# Patient Record
Sex: Female | Born: 1950 | Race: Black or African American | Hispanic: No | Marital: Single | Smoking: Never smoker
Health system: Southern US, Community
[De-identification: ages and names within clinical notes are randomized; demographics above are authoritative.]

## PROBLEM LIST (undated history)

## (undated) DIAGNOSIS — R350 Frequency of micturition: Secondary | ICD-10-CM

## (undated) DIAGNOSIS — R569 Unspecified convulsions: Secondary | ICD-10-CM

## (undated) DIAGNOSIS — I10 Essential (primary) hypertension: Secondary | ICD-10-CM

## (undated) DIAGNOSIS — N819 Female genital prolapse, unspecified: Secondary | ICD-10-CM

## (undated) DIAGNOSIS — R053 Chronic cough: Secondary | ICD-10-CM

## (undated) DIAGNOSIS — K759 Inflammatory liver disease, unspecified: Secondary | ICD-10-CM

## (undated) DIAGNOSIS — R351 Nocturia: Secondary | ICD-10-CM

## (undated) DIAGNOSIS — R05 Cough: Secondary | ICD-10-CM

## (undated) DIAGNOSIS — R3915 Urgency of urination: Secondary | ICD-10-CM

## (undated) DIAGNOSIS — K5900 Constipation, unspecified: Secondary | ICD-10-CM

## (undated) DIAGNOSIS — E119 Type 2 diabetes mellitus without complications: Secondary | ICD-10-CM

## (undated) DIAGNOSIS — F1111 Opioid abuse, in remission: Secondary | ICD-10-CM

## (undated) DIAGNOSIS — K219 Gastro-esophageal reflux disease without esophagitis: Secondary | ICD-10-CM

## (undated) DIAGNOSIS — E785 Hyperlipidemia, unspecified: Secondary | ICD-10-CM

## (undated) DIAGNOSIS — J45909 Unspecified asthma, uncomplicated: Secondary | ICD-10-CM

## (undated) DIAGNOSIS — Z8619 Personal history of other infectious and parasitic diseases: Secondary | ICD-10-CM

## (undated) DIAGNOSIS — M1990 Unspecified osteoarthritis, unspecified site: Secondary | ICD-10-CM

## (undated) DIAGNOSIS — Z87442 Personal history of urinary calculi: Secondary | ICD-10-CM

## (undated) HISTORY — PX: TUBAL LIGATION: SHX77

## (undated) HISTORY — PX: BREAST REDUCTION SURGERY: SHX8

## (undated) HISTORY — PX: ESOPHAGOGASTRODUODENOSCOPY: SHX1529

## (undated) HISTORY — PX: COLONOSCOPY W/ POLYPECTOMY: SHX1380

## (undated) HISTORY — DX: Unspecified asthma, uncomplicated: J45.909

## (undated) HISTORY — PX: BREAST SURGERY: SHX581

---

## 1998-03-02 ENCOUNTER — Ambulatory Visit (HOSPITAL_COMMUNITY): Admission: RE | Admit: 1998-03-02 | Discharge: 1998-03-02 | Payer: Self-pay | Admitting: Internal Medicine

## 2012-05-17 ENCOUNTER — Other Ambulatory Visit: Payer: Self-pay | Admitting: Family Medicine

## 2012-05-17 DIAGNOSIS — Z1231 Encounter for screening mammogram for malignant neoplasm of breast: Secondary | ICD-10-CM

## 2012-06-06 ENCOUNTER — Ambulatory Visit: Payer: Self-pay

## 2012-06-18 ENCOUNTER — Ambulatory Visit
Admission: RE | Admit: 2012-06-18 | Discharge: 2012-06-18 | Disposition: A | Discharge status: Home / Self Care | Payer: Medicaid Other | Source: Ambulatory Visit | Attending: Family Medicine | Admitting: Family Medicine

## 2012-06-18 DIAGNOSIS — Z1231 Encounter for screening mammogram for malignant neoplasm of breast: Secondary | ICD-10-CM

## 2012-10-16 ENCOUNTER — Encounter: Payer: Self-pay | Admitting: Internal Medicine

## 2012-10-16 ENCOUNTER — Ambulatory Visit (INDEPENDENT_AMBULATORY_CARE_PROVIDER_SITE_OTHER): Payer: Medicaid Other | Admitting: Internal Medicine

## 2012-10-16 VITALS — BP 130/82 | HR 69 | Temp 97.9°F | Ht 62.0 in | Wt 193.0 lb

## 2012-10-16 DIAGNOSIS — R05 Cough: Secondary | ICD-10-CM

## 2012-10-16 MED ORDER — DEXLANSOPRAZOLE 60 MG PO CPDR: 60.0000 mg | DELAYED_RELEASE_CAPSULE | Freq: Every day | ORAL | Status: DC

## 2012-10-16 MED ORDER — FAMOTIDINE 20 MG PO TABS: ORAL_TABLET | ORAL | Status: DC

## 2012-10-16 MED ORDER — PREDNISONE (PAK) 10 MG PO TABS: ORAL_TABLET | ORAL | Status: DC

## 2012-10-16 NOTE — Patient Instructions (Addendum)
Stop qvar and just yellow inhaler proventil if needed  Prednisone 10 mg take  4 each am x 2 days,   2 each am x 2 days,  1 each am x2days and stop   Try dexilant 60 mg   Take one 30-60 min before first meal of the day and Pepcid 20 mg one bedtime until return  GERD (REFLUX)  is an extremely common cause of respiratory symptoms, many times with no significant heartburn at all.    It can be treated with medication, but also with lifestyle changes including avoidance of late meals, excessive alcohol, smoking cessation, and avoid fatty foods, chocolate, peppermint, colas, red wine, and acidic juices such as orange juice.  NO MINT OR MENTHOL PRODUCTS SO NO COUGH DROPS  USE SUGARLESS CANDY INSTEAD (jolley ranchers or Stover's)  NO OIL BASED VITAMINS - use powdered substitutes.    Please schedule a follow up office visit in 2 weeks, sooner if needed

## 2012-10-16 NOTE — Assessment & Plan Note (Signed)
The most common causes of chronic cough in immunocompetent adults include the following: upper airway cough syndrome (UACS), previously referred to as postnasal drip syndrome (PNDS), which is caused by variety of rhinosinus conditions; (2) asthma; (3) GERD; (4) chronic bronchitis from cigarette smoking or other inhaled environmental irritants; (5) nonasthmatic eosinophilic bronchitis; and (6) bronchiectasis.   These conditions, singly or in combination, have accounted for up to 94% of the causes of chronic cough in prospective studies.   Other conditions have constituted no >6% of the causes in prospective studies These have included bronchogenic carcinoma, chronic interstitial pneumonia, sarcoidosis, left ventricular failure, ACEI-induced cough, and aspiration from a condition associated with pharyngeal dysfunction.   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   These are the same pts (now being labeled as having "irritable larynx syndrome" by some cough centers) who not infrequently have a history of having failed to tolerate ace inhibitors,  dry powder inhalers or biphosphonates or report having atypical reflux symptoms that don't respond to standard doses of PPI , and are easily confused as having aecopd or asthma flares by even experienced allergists/ pulmonologists.   Will try on max gerd rx then regroup in 2 weeks  Reviewed with pt: The standardized cough guidelines recently published in Chest by [place] in 2006  are a multiple step process  (up to 12!) , not a single office visit,  and are intended  to address this problem logically,  with an alogrithm dependent on response to empiric treatment at  each progressive step  to determine a specific diagnosis with  minimal addtional testing needed. Therefore if compliance is an issue or can't be accurately verified then it's very unlikely the standard evaluation and treatment will be successful here.    Furthermore, response to therapy (other than acute cough suppression, which should only be used short term with avoidance of narcotic containing cough syrups if possible), can be a gradual process for which the patient may not receive immediate benefit.  Unlike going to an eye doctor where the right rx is almost always the first one and is immediately effective, this is almost never the case in the management of chronic cough syndromes and the patient needs to commit up front to compliance with recommendations and have the patience to wait out a response for up to 6 weeks of therapy directed at the likely underlying problem(s).

## 2012-10-16 NOTE — Progress Notes (Addendum)
Subjective:    Patient ID: Gloria Patterson, female    DOB: November 27, 1951   MRN: [id number]  HPI  65 yobf never smoker  dx with asthma 1980's= wheeze and sob and required daily inhalers until around 2000 when stopped taking IV drugs and onset of cough after stopped drugs daily since and worse since 2011 so referred [facility] 10/16/2012 to pulmonary clinic for cough.   10/16/2012 1st pulmonary eval cc daily cough x 13 years worse x 2 years daily x year round prod min mucus white better on cough suppression, inhalers with proventil and qvar > no better.  Night when lie down worse and has to sit up eventually goes away and sleeps ok without am exac.  Did best with prednisone x 2 weeks after   Using cough drops.     Kouffman Reflux v Neurogenic Cough Differentiator Reflux Comments  Do you awaken from a sound sleep coughing violently?                            With trouble breathing? no   Do you have choking episodes when you cannot  Get enough air, gasping for air ?              Yes   Do you usually cough when you lie down into  The bed, or when you just lie down to rest ?                          Yes   Do you usually cough after meals or eating?         Yes   Do you cough when (or after) you bend over?    no   GERD SCORE     Kouffman Reflux v Neurogenic Cough Differentiator Neurogenic   Do you more-or-less cough all day long? no   Does change of temperature make you cough? yes   Does laughing or chuckling cause you to cough? yes   Do fumes (perfume, automobile fumes, burned  Toast, etc.,) cause you to cough ?      yes   Does speaking, singing, or talking on the phone cause you to cough   ?               yes   Neurogenic/Airway score           Review of Systems  Constitutional: Negative for fever, chills and unexpected weight change.  HENT: Positive for trouble swallowing. Negative for ear pain, nosebleeds, congestion, sore throat, rhinorrhea, sneezing, dental problem,  voice change, postnasal drip and sinus pressure.   Eyes: Negative for visual disturbance.  Respiratory: Positive for cough and shortness of breath. Negative for choking.   Cardiovascular: Positive for chest pain and leg swelling.  Gastrointestinal: Negative for vomiting, abdominal pain and diarrhea.  Genitourinary: Negative for difficulty urinating.  Musculoskeletal: Negative for arthralgias.  Skin: Negative for rash.  Neurological: Negative for tremors, syncope and headaches.  Hematological: Does not bruise/bleed easily.       Objective:   Physical Exam  edentuous amb bf nad with classic wheezing  Wt 193 10/16/2012    HEENT: nl dentition, turbinates, and orophanx. Nl external ear canals without cough reflex   NECK :  without JVD/Nodes/TM/ nl carotid upstrokes bilaterally   LUNGS: no acc muscle use, clear to A and P bilaterally without cough on insp or exp maneuvers  CV:  RRR  no s3 or murmur or increase in P2, no edema   ABD:  soft and nontender with nl excursion in the supine position. No bruits or organomegaly, bowel sounds nl  MS:  warm without deformities, calf tenderness, cyanosis or clubbing  SKIN: warm and dry without lesions    NEURO:  alert, approp, no deficits    cxr 09/30/12 report reviewed No active dz    Assessment & Plan:

## 2012-10-30 ENCOUNTER — Encounter: Payer: Self-pay | Admitting: Internal Medicine

## 2012-10-30 ENCOUNTER — Ambulatory Visit (INDEPENDENT_AMBULATORY_CARE_PROVIDER_SITE_OTHER): Payer: Medicaid Other | Admitting: Internal Medicine

## 2012-10-30 VITALS — BP 140/90 | HR 82 | Temp 97.8°F | Ht 62.0 in | Wt 192.0 lb

## 2012-10-30 DIAGNOSIS — R05 Cough: Secondary | ICD-10-CM

## 2012-10-30 MED ORDER — PREDNISONE (PAK) 10 MG PO TABS: ORAL_TABLET | ORAL | Status: DC

## 2012-10-30 MED ORDER — FAMOTIDINE 20 MG PO TABS: ORAL_TABLET | ORAL | Status: DC

## 2012-10-30 MED ORDER — OMEPRAZOLE 20 MG PO CPDR: 20.0000 mg | DELAYED_RELEASE_CAPSULE | Freq: Every day | ORAL | Status: DC

## 2012-10-30 NOTE — Progress Notes (Signed)
Subjective:    Patient ID: Gloria Patterson, female    DOB: 1951/09/26   MRN: [id number]  HPI  24 yobf never smoker  dx with asthma 1980's= wheeze and sob and required daily inhalers until around 2000 when stopped taking IV drugs and onset of cough after stopped drugs daily since and worse since 2011 so referred [facility] 10/16/2012 to pulmonary clinic for cough.   10/16/2012 1st pulmonary eval cc daily cough x 13 years worse x 2 years daily x year round prod min mucus white better on cough suppression, inhalers with proventil and qvar > no better.  Night when lie down worse and has to sit up eventually goes away and sleeps ok without am exac.  Did best with prednisone x 2 weeks after  Using cough drops and having food sticks in throat rec Stop qvar and just yellow inhaler proventil if needed > did not need any Prednisone 10 mg take  4 each am x 2 days,   2 each am x 2 days,  1 each am x2days and stop  Try dexilant 60 mg   Take one 30-60 min before first meal of the day and Pepcid 20 mg one bedtime until return GERD diet  10/30/2012 f/u ov/Jenaro Souder cc completely better p prednisone then cough recurred about half as severe the day after prednisone completed, but no use of inhalers at all since last ov and no excess mucus produciton. No obvious daytime variabilty or assoc sob or cp or chest tightness, subjective wheeze overt sinus or hb symptoms. No unusual exp hx or h/o childhood pna/ asthma or premature birth to his knowledge.     Kouffman Reflux v Neurogenic Cough Differentiator Reflux Comments  Do you awaken from a sound sleep coughing violently?                            With trouble breathing? no   Do you have choking episodes when you cannot  Get enough air, gasping for air ?              no   Do you usually cough when you lie down into  The bed, or when you just lie down to rest ?                          no   Do you usually cough after meals or eating?         maybe   Do you  cough when (or after) you bend over?    no   GERD SCORE     Kouffman Reflux v Neurogenic Cough Differentiator Neurogenic   Do you more-or-less cough all day long? no   Does change of temperature make you cough? yes   Does laughing or chuckling cause you to cough? yes   Do fumes (perfume, automobile fumes, burned  Toast, etc.,) cause you to cough ?      yes   Does speaking, singing, or talking on the phone cause you to cough   ?               better   Neurogenic/Airway score          Objective:   Physical Exam  edentuous amb bf nad with min pseudowheeze on insp only (much better)  Wt 193 10/16/2012  > 192 10/30/2012    HEENT: nl dentition, turbinates, and orophanx. Nl  external ear canals without cough reflex   NECK :  without JVD/Nodes/TM/ nl carotid upstrokes bilaterally   LUNGS: no acc muscle use, clear to A and P bilaterally without cough on insp or exp maneuvers   CV:  RRR  no s3 or murmur or increase in P2, no edema   ABD:  soft and nontender with nl excursion in the supine position. No bruits or organomegaly, bowel sounds nl  MS:  warm without deformities, calf tenderness, cyanosis or clubbing      cxr 09/30/12 report reviewed No active dz    Assessment & Plan:

## 2012-10-30 NOTE — Patient Instructions (Addendum)
Omeprazole  20mg   Take 30-60 min before first meal of the day and Pepcid 20 mg one bedtime until  Return  Prednisone 10 mg take  4 each am x 2 days,   2 each am x 2 days,  1 each am x2days and stop   GERD (REFLUX)  is an extremely common cause of respiratory symptoms, many times with no significant heartburn at all.    It can be treated with medication, but also with lifestyle changes including avoidance of late meals, excessive alcohol, smoking cessation, and avoid fatty foods, chocolate, peppermint, colas, red wine, and acidic juices such as orange juice.  NO MINT OR MENTHOL PRODUCTS SO NO COUGH DROPS  USE SUGARLESS CANDY INSTEAD (jolley ranchers or Stover's)  NO OIL BASED VITAMINS - use powdered substitutes.    Please schedule a follow up office visit in 4 weeks, sooner if needed

## 2012-11-01 NOTE — Assessment & Plan Note (Addendum)
Lack of flare off qvar and b2 argue against asthma and the recurrence of symptoms immediately after stopping prednisone argues for a psychogenic component to the cough but she is clearly better at this point on rx  For  Classic Upper airway cough syndrome, so named because it's frequently impossible to sort out how much is  CR/sinusitis with freq throat clearing (which can be related to primary GERD)   vs  causing  secondary (" extra esophageal")  GERD from wide swings in gastric pressure that occur with throat clearing, often  promoting self use of mint and menthol lozenges that reduce the lower esophageal sphincter tone and exacerbate the problem further in a cyclical fashion.   These are the same pts (now being labeled as having "irritable larynx syndrome" by some cough centers) who not infrequently have a history of having failed to tolerate ace inhibitors,  dry powder inhalers or biphosphonates or report having atypical reflux symptoms that don't respond to standard doses of PPI , and are easily confused as having aecopd or asthma flares by even experienced allergists/ pulmonologists.   For now therefore continue gerd rx and given second course of prednisone to see if get same strong response, in which case needs methacholine challenge to r/o asthma

## 2012-11-28 ENCOUNTER — Ambulatory Visit: Payer: Medicaid Other | Admitting: Internal Medicine

## 2012-12-05 ENCOUNTER — Encounter: Payer: Self-pay | Admitting: Internal Medicine

## 2012-12-05 ENCOUNTER — Ambulatory Visit (INDEPENDENT_AMBULATORY_CARE_PROVIDER_SITE_OTHER): Payer: Medicaid Other | Admitting: Internal Medicine

## 2012-12-05 VITALS — BP 140/80 | HR 108 | Temp 97.5°F | Ht 61.0 in | Wt 193.0 lb

## 2012-12-05 DIAGNOSIS — R05 Cough: Secondary | ICD-10-CM

## 2012-12-05 NOTE — Progress Notes (Signed)
Subjective:    Patient ID: Gloria Patterson, female    DOB: 1951/06/20   MRN: [id number]  HPI  64 yobf never smoker  dx with asthma 1980's= wheeze and sob and required daily inhalers until around 2000 when stopped taking IV drugs and onset of cough after stopped drugs daily since and worse since 2011 so referred [facility] 10/16/2012 to pulmonary clinic for cough.   10/16/2012 1st pulmonary eval cc daily cough x 13 years worse x 2 years daily x year round prod min mucus white better on cough suppression, inhalers with proventil and qvar > no better.  Night when lie down worse and has to sit up eventually goes away and sleeps ok without am exac.  Did best with prednisone x 2 weeks after  Using cough drops and having food sticks in throat rec Stop qvar and just yellow inhaler proventil if needed > did not need any Prednisone 10 mg take  4 each am x 2 days,   2 each am x 2 days,  1 each am x2days and stop  Try dexilant 60 mg   Take one 30-60 min before first meal of the day and Pepcid 20 mg one bedtime until return GERD diet  10/30/2012 f/u ov/Handy Mcloud cc completely better p prednisone then cough recurred about half as severe the day after prednisone completed, but no use of inhalers at all since last ov and no excess mucus produciton.  rec Omeprazole  20mg   Take 30-60 min before first meal of the day and Pepcid 20 mg one bedtime until  Return Prednisone 10 mg take  4 each am x 2 days,   2 each am x 2 days,  1 each am x2days and stop  GERD diet   12/05/2012 f/u ov/Londa Mackowski cc much better to her satisfaction. No obvious daytime variabilty or assoc chronic sob or cp or chest tightness, subjective wheeze overt sinus or hb symptoms. No unusual exp hx.  No use of any inhalers at all. Still with urge to clear throat however.  Sleeping ok without nocturnal  or early am exacerbation  of respiratory  c/o's or need for noct saba. Also denies any obvious fluctuation of symptoms with weather or  environmental changes or other aggravating or alleviating factors except as outlined above   ROS  The following are not active complaints unless bolded sore throat, dysphagia, dental problems, itching, sneezing,  nasal congestion or excess/ purulent secretions, ear ache,   fever, chills, sweats, unintended wt loss, pleuritic or exertional cp, hemoptysis,  orthopnea pnd or leg swelling, presyncope, palpitations, heartburn, abdominal pain, anorexia, nausea, vomiting, diarrhea  or change in bowel or urinary habits, change in stools or urine, dysuria,hematuria,  rash, arthralgias, visual complaints, headache, numbness weakness or ataxia or problems with walking or coordination,  change in mood/affect or memory.          Kouffman Reflux v Neurogenic Cough Differentiator Reflux Comments  Do you awaken from a sound sleep coughing violently?                            With trouble breathing? no   Do you have choking episodes when you cannot  Get enough air, gasping for air ?              no   Do you usually cough when you lie down into  The bed, or when you just lie down to rest ?  no   Do you usually cough after meals or eating?         No    Do you cough when (or after) you bend over?    no   GERD SCORE     Kouffman Reflux v Neurogenic Cough Differentiator Neurogenic   Do you more-or-less cough all day long? no   Does change of temperature make you cough? no   Does laughing or chuckling cause you to cough? no   Do fumes (perfume, automobile fumes, burned  Toast, etc.,) cause you to cough ?      no   Does speaking, singing, or talking on the phone cause you to cough   ?               no   Neurogenic/Airway score          Objective:   Physical Exam  edentuous amb bf nad with min pseudowheeze on insp only (much better) and occ vigorous throat clearing   Wt 193 10/16/2012  > 192 10/30/2012 > 193 12/05/2012    HEENT: nl dentition, turbinates, and orophanx. Nl  external ear canals without cough reflex   NECK :  without JVD/Nodes/TM/ nl carotid upstrokes bilaterally   LUNGS: no acc muscle use, clear to A and P bilaterally without cough on insp or exp maneuvers   CV:  RRR  no s3 or murmur or increase in P2, no edema   ABD:  soft and nontender with nl excursion in the supine position. No bruits or organomegaly, bowel sounds nl  MS:  warm without deformities, calf tenderness, cyanosis or clubbing      cxr 09/30/12 report reviewed No active dz    Assessment & Plan:

## 2012-12-05 NOTE — Patient Instructions (Addendum)
For cough, take delsym 2 tsp every 12 hours  GERD (REFLUX)  is an extremely common cause of respiratory symptoms, many times with no significant heartburn at all.    It can be treated with medication, but also with lifestyle changes including avoidance of late meals, excessive alcohol, smoking cessation, and avoid fatty foods, chocolate, peppermint, colas, red wine, and acidic juices such as orange juice.  NO MINT OR MENTHOL PRODUCTS SO NO COUGH DROPS  USE SUGARLESS CANDY INSTEAD (jolley ranchers sugarless candy or Stover's)  NO OIL BASED VITAMINS - use powdered substitutes.  Please see patient coordinator before you leave today  to schedule sinus ct   Please remember to go to the lab  department downstairs for your tests - we will call you with the results when they are available.    Please schedule a follow up office visit in 4 weeks, sooner if needed

## 2012-12-05 NOTE — Assessment & Plan Note (Signed)
Classic Upper airway cough syndrome, so named because it's frequently impossible to sort out how much is  CR/sinusitis with freq throat clearing (which can be related to primary GERD)   vs  causing  secondary (" extra esophageal")  GERD from wide swings in gastric pressure that occur with throat clearing, often  promoting self use of mint and menthol lozenges that reduce the lower esophageal sphincter tone and exacerbate the problem further in a cyclical fashion.   These are the same pts (now being labeled as having "irritable larynx syndrome" by some cough centers) who not infrequently have a history of having failed to tolerate ace inhibitors,  dry powder inhalers or biphosphonates or report having atypical reflux symptoms that don't respond to standard doses of PPI , and are easily confused as having aecopd or asthma flares by even experienced allergists/ pulmonologists.  Much better than baseline off all inhalers and just on gerd rx and yet still having urge to clear throat, reviwed  Will complete w/u with allergy profile and sinus ct and consider trial of neurontin vs elavil vs both if continues

## 2012-12-16 ENCOUNTER — Other Ambulatory Visit: Payer: Medicaid Other

## 2012-12-19 ENCOUNTER — Other Ambulatory Visit: Payer: Medicaid Other

## 2013-01-02 ENCOUNTER — Ambulatory Visit (INDEPENDENT_AMBULATORY_CARE_PROVIDER_SITE_OTHER)
Admission: RE | Admit: 2013-01-02 | Discharge: 2013-01-02 | Disposition: A | Discharge status: Home / Self Care | Payer: Medicaid Other | Source: Ambulatory Visit | Attending: Internal Medicine | Admitting: Internal Medicine

## 2013-01-02 ENCOUNTER — Encounter: Payer: Self-pay | Admitting: Internal Medicine

## 2013-01-02 DIAGNOSIS — R05 Cough: Secondary | ICD-10-CM

## 2013-01-02 NOTE — Progress Notes (Signed)
Quick Note:  Spoke with pt and notified of results per Dr. Wert. Pt verbalized understanding and denied any questions.  ______ 

## 2013-01-03 ENCOUNTER — Ambulatory Visit: Payer: Medicaid Other | Admitting: Internal Medicine

## 2013-01-15 ENCOUNTER — Ambulatory Visit (INDEPENDENT_AMBULATORY_CARE_PROVIDER_SITE_OTHER): Payer: Medicaid Other | Admitting: Internal Medicine

## 2013-01-15 ENCOUNTER — Encounter: Payer: Self-pay | Admitting: Internal Medicine

## 2013-01-15 ENCOUNTER — Other Ambulatory Visit: Payer: Medicaid Other

## 2013-01-15 VITALS — BP 144/90 | HR 79 | Temp 98.9°F | Ht 61.0 in | Wt 194.0 lb

## 2013-01-15 DIAGNOSIS — R059 Cough, unspecified: Secondary | ICD-10-CM

## 2013-01-15 DIAGNOSIS — R05 Cough: Secondary | ICD-10-CM

## 2013-01-15 MED ORDER — PREDNISONE (PAK) 10 MG PO TABS: ORAL_TABLET | ORAL | Status: DC

## 2013-01-15 MED ORDER — TRAMADOL HCL 50 MG PO TABS: ORAL_TABLET | ORAL | Status: DC

## 2013-01-15 MED ORDER — OMEPRAZOLE 20 MG PO CPDR: 20.0000 mg | DELAYED_RELEASE_CAPSULE | Freq: Every day | ORAL | Status: DC

## 2013-01-15 MED ORDER — FAMOTIDINE 20 MG PO TABS: ORAL_TABLET | ORAL | Status: DC

## 2013-01-15 NOTE — Patient Instructions (Addendum)
Take delsym two tsp every 12 hours and supplement if needed with  tramadol 50 mg up to 2 every 4 hours to suppress the urge to cough. Swallowing water or using ice chips/non mint and menthol containing candies (such as lifesavers or sugarless jolly ranchers) are also effective.  You should rest your voice and avoid activities that you know make you cough.  Once you have eliminated the cough for 3 straight days try reducing the tramadol first,  then the delsym as tolerated.    Resume omeprazole 20 mg Take 30-60 min before first meal of the day and pepcid 20 mg one at bedtime  GERD (REFLUX)  is an extremely common cause of respiratory symptoms, many times with no significant heartburn at all.    It can be treated with medication, but also with lifestyle changes including avoidance of late meals, excessive alcohol, smoking cessation, and avoid fatty foods, chocolate, peppermint, colas, red wine, and acidic juices such as orange juice.  NO MINT OR MENTHOL PRODUCTS SO NO COUGH DROPS  USE SUGARLESS CANDY INSTEAD (jolley ranchers or Stover's)  NO OIL BASED VITAMINS - use powdered substitutes.   Please remember to go to the lab  department downstairs for your tests - we will call you with the results when they are available.  Please schedule a follow up office visit in 4 weeks, sooner if needed

## 2013-01-15 NOTE — Progress Notes (Signed)
Subjective:    Patient ID: Gloria Patterson, female    DOB: 24-Apr-1951   MRN: [id number]  HPI  20 yobf never smoker  dx with asthma 1980's= wheeze and sob and required daily inhalers until around 2000 when stopped taking IV drugs and onset of cough after stopped drugs daily since and worse since 2011 so referred [facility] 10/16/2012 to pulmonary clinic for cough.   10/16/2012 1st pulmonary eval cc daily cough x 13 years worse x 2 years daily x year round prod min mucus white better on cough suppression, inhalers with proventil and qvar > no better.  Night when lie down worse and has to sit up eventually goes away and sleeps ok without am exac.  Did best with prednisone x 2 weeks after  Using cough drops and having food sticks in throat rec Stop qvar and just yellow inhaler proventil if needed > did not need any Prednisone 10 mg take  4 each am x 2 days,   2 each am x 2 days,  1 each am x2days and stop  Try dexilant 60 mg   Take one 30-60 min before first meal of the day and Pepcid 20 mg one bedtime until return GERD diet  10/30/2012 f/u ov/Madisynn Plair cc completely better p prednisone then cough recurred about half as severe the day after prednisone completed, but no use of inhalers at all since last ov and no excess mucus produciton.  rec Omeprazole  20mg   Take 30-60 min before first meal of the day and Pepcid 20 mg one bedtime until  Return Prednisone 10 mg take  4 each am x 2 days,   2 each am x 2 days,  1 each am x2days and stop  GERD diet   12/05/2012 f/u ov/Aleatha Taite cc much better to her satisfaction but Still with urge to clear throat however.  rec Sinus ct > neg Allergy profile > declined  01/16/2013 f/u ov/Lexa Coronado cc cough recurred when ran out of meds because she couldn't afford them, not clear which meds helped the most. No sob  No obvious daytime variabilty or assoc chronic sob or cp or chest tightness, subjective wheeze overt sinus or hb symptoms. No unusual exp hx.  No use of  any inhalers at all.    Sleeping ok without nocturnal  or early am exacerbation  of respiratory  c/o's or need for noct saba. Also denies any obvious fluctuation of symptoms with weather or environmental changes or other aggravating or alleviating factors except as outlined above   ROS  The following are not active complaints unless bolded sore throat, dysphagia, dental problems, itching, sneezing,  nasal congestion or excess/ purulent secretions, ear ache,   fever, chills, sweats, unintended wt loss, pleuritic or exertional cp, hemoptysis,  orthopnea pnd or leg swelling, presyncope, palpitations, heartburn, abdominal pain, anorexia, nausea, vomiting, diarrhea  or change in bowel or urinary habits, change in stools or urine, dysuria,hematuria,  rash, arthralgias, visual complaints, headache, numbness weakness or ataxia or problems with walking or coordination,  change in mood/affect or memory.            Objective:   Physical Exam  edentuous amb bf nad with min pseudowheeze on insp only   and occ vigorous throat clearing   Wt 193 10/16/2012  > 192 10/30/2012 > 193 12/05/2012 > 01/15/2013  194    HEENT: edentulous  turbinates, and orophanx. Nl external ear canals without cough reflex   NECK :  without JVD/Nodes/TM/ nl  carotid upstrokes bilaterally   LUNGS: no acc muscle use, clear to A and P bilaterally without cough on insp or exp maneuvers   CV:  RRR  no s3 or murmur or increase in P2, no edema   ABD:  soft and nontender with nl excursion in the supine position. No bruits or organomegaly, bowel sounds nl  MS:  warm without deformities, calf tenderness, cyanosis or clubbing      cxr 09/30/12 report reviewed No active dz    Assessment & Plan:

## 2013-01-16 NOTE — Assessment & Plan Note (Signed)
-   Sinus CT 01/02/13 1. Negative paranasal sinuses. 2. Evidence of remote right mastoid inflammation. - Allergy profile 01/15/2013 >>  The standardized cough guidelines published in Chest by [place] in 2006 are still the best available and consist of a multiple step process (up to 12!) , not a single office visit,  and are intended  to address this problem logically,  with an alogrithm dependent on response to empiric treatment at  each progressive step  to determine a specific diagnosis with  minimal addtional testing needed. Therefore if adherence is an issue or can't be accurately verified,  it's very unlikely the standard evaluation and treatment will be successful here.    Furthermore, response to therapy (other than acute cough suppression, which should only be used short term with avoidance of narcotic containing cough syrups if possible), can be a gradual process for which the patient may perceive immediate benefit.  Unlike going to an eye doctor where the best perscription is almost always the first one and is immediately effective, this is almost never the case in the management of chronic cough syndromes. Therefore the patient needs to commit up front to consistently adhere to recommendations  for up to 6 weeks of therapy directed at the likely underlying problem(s) before the response can be reasonably evaluated.   For now plan to resume rx for cyclical cough then regroup    Each maintenance medication was reviewed in detail including most importantly the difference between maintenance and as needed and under what circumstances the prns are to be used.  Please see instructions for details which were reviewed in writing and the patient given a copy.

## 2013-01-22 ENCOUNTER — Telehealth: Payer: Self-pay | Admitting: Internal Medicine

## 2013-01-22 NOTE — Telephone Encounter (Signed)
Called, spoke with pt.  Pt states she has been out of omeprazole and Pepcid.  Cough has worsened during this time; pt feels worsened cough is coming from being off omeprazole and pepcid.  She states pharm advised her they did not have either rxs.  I called [facility] and spoke with [facility] who verified both the omeprazole and pepcid rxs were received and are ready for pick up.  Pt aware of this and was advised to call back if cough does not improve after restarting meds.  She verbalized understanding and voiced no further questions or concerns at this time.

## 2013-02-07 ENCOUNTER — Other Ambulatory Visit: Payer: Self-pay | Admitting: Urology

## 2013-02-18 ENCOUNTER — Ambulatory Visit: Payer: Medicaid Other | Admitting: Internal Medicine

## 2013-03-03 ENCOUNTER — Encounter (HOSPITAL_BASED_OUTPATIENT_CLINIC_OR_DEPARTMENT_OTHER): Payer: Self-pay | Admitting: *Deleted

## 2013-03-03 ENCOUNTER — Telehealth: Payer: Self-pay | Admitting: Internal Medicine

## 2013-03-03 NOTE — Progress Notes (Signed)
NPO AFTER MN. ARRIVES AT 0700. NEEDS HG. WILL TAKE METHADONE, AND IF GETS PRILOSEC REFILLED , AM OF SURG W/ SIP OF WATER.

## 2013-03-03 NOTE — Telephone Encounter (Signed)
Spoke with pt  She is asking why MW is treating her for acid reflux  I advised that this is b/c acid reflux can cause a cough sometimes and that the acid suppression meds can help with this I advised that she should keep her ov with MW on 3/20 to discuss further Pt verbalized understanding and states nothing further needed

## 2013-03-06 ENCOUNTER — Ambulatory Visit (INDEPENDENT_AMBULATORY_CARE_PROVIDER_SITE_OTHER): Payer: Medicaid Other | Admitting: Internal Medicine

## 2013-03-06 ENCOUNTER — Encounter: Payer: Self-pay | Admitting: Internal Medicine

## 2013-03-06 VITALS — BP 126/78 | HR 79 | Temp 97.1°F | Ht 61.0 in | Wt 195.0 lb

## 2013-03-06 DIAGNOSIS — R05 Cough: Secondary | ICD-10-CM

## 2013-03-06 MED ORDER — PREDNISONE (PAK) 10 MG PO TABS: ORAL_TABLET | ORAL | Status: DC

## 2013-03-06 MED ORDER — GABAPENTIN 100 MG PO CAPS: 100.0000 mg | ORAL_CAPSULE | Freq: Three times a day (TID) | ORAL | Status: DC

## 2013-03-06 NOTE — Patient Instructions (Addendum)
Take delsym two tsp every 12 hours every 12 hours neurontin 100mg  three time a day   Resume prilosec 20 mg Take 30-60 min before first meal of the day and pepcid 20 mg one at bedtime  Prednisone 10 mg take  4 each am x 2 days,   2 each am x 2 days,  1 each am x2days and stop   GERD (REFLUX)  is an extremely common cause of respiratory symptoms, many times with no significant heartburn at all.    It can be treated with medication, but also with lifestyle changes including avoidance of late meals, excessive alcohol, smoking cessation, and avoid fatty foods, chocolate, peppermint, colas, red wine, and acidic juices such as orange juice.  NO MINT OR MENTHOL PRODUCTS SO NO COUGH DROPS  USE SUGARLESS CANDY INSTEAD (jolley ranchers or Stover's)  NO OIL BASED VITAMINS - use powdered substitutes.   Please schedule a follow up office visit in 4 weeks, sooner if needed with all active active medications in your hand

## 2013-03-06 NOTE — Progress Notes (Signed)
Subjective:    Patient ID: Gloria Patterson, female    DOB: 02-06-1951   MRN: [id number]  HPI  31 yobf never smoker  dx with asthma 1980's= wheeze and sob and required daily inhalers until around 2000 when stopped taking IV drugs and onset of cough after stopped drugs daily since and worse since 2011 so referred [facility] 10/16/2012 to pulmonary clinic for cough.   10/16/2012 1st pulmonary eval cc daily cough x 13 years worse x 2 years daily x year round prod min mucus white better on cough suppression, inhalers with proventil and qvar > no better.  Night when lie down worse and has to sit up eventually goes away and sleeps ok without am exac.  Did best with prednisone x 2 weeks after  Using cough drops and having food sticks in throat rec Stop qvar and just yellow inhaler proventil if needed > did not need any Prednisone 10 mg take  4 each am x 2 days,   2 each am x 2 days,  1 each am x2days and stop  Try dexilant 60 mg   Take one 30-60 min before first meal of the day and Pepcid 20 mg one bedtime until return GERD diet  10/30/2012 f/u ov/Gloria Patterson cc completely better p prednisone then cough recurred about half as severe the day after prednisone completed, but no use of inhalers at all since last ov and no excess mucus produciton.  rec Omeprazole  20mg   Take 30-60 min before first meal of the day and Pepcid 20 mg one bedtime until  Return Prednisone 10 mg take  4 each am x 2 days,   2 each am x 2 days,  1 each am x2days and stop  GERD diet   12/05/2012 f/u ov/Gloria Patterson cc much better to her satisfaction but Still with urge to clear throat however.  rec Sinus ct > neg Allergy profile > declined  01/16/2013 f/u ov/Gloria Patterson cc cough recurred when ran out of meds because she couldn't afford them, not clear which meds helped the most. No sob rec Take delsym two tsp every 12 hours and supplement if needed with  tramadol 50 mg up to 2 every 4 hours to suppress the urge to cough. Swallowing water  or using ice chips/non mint and menthol containing candies (such as lifesavers or sugarless jolly ranchers) are also effective.  You should rest your voice and avoid activities that you know make you cough. Once you have eliminated the cough for 3 straight days try reducing the tramadol first,  then the delsym as tolerated.   Resume omeprazole 20 mg Take 30-60 min before first meal of the day and pepcid 20 mg one at bedtime GERD  03/06/2013 f/u ov/Gloria Patterson cc cough worse during day, dry, hacking, could not take tramadol, prednisone seemed to help  No obvious daytime variabilty or assoc chronic sob or cp or chest tightness, subjective wheeze overt sinus or hb symptoms. No unusual exp hx.  No use of any inhalers at all.    Sleeping ok without nocturnal  or early am exacerbation  of respiratory  c/o's or need for noct saba. Also denies any obvious fluctuation of symptoms with weather or environmental changes or other aggravating or alleviating factors except as outlined above   ROS  The following are not active complaints unless bolded sore throat, dysphagia, dental problems, itching, sneezing,  nasal congestion or excess/ purulent secretions, ear ache,   fever, chills, sweats, unintended wt loss, pleuritic or  exertional cp, hemoptysis,  orthopnea pnd or leg swelling, presyncope, palpitations, heartburn, abdominal pain, anorexia, nausea, vomiting, diarrhea  or change in bowel or urinary habits, change in stools or urine, dysuria,hematuria,  rash, arthralgias, visual complaints, headache, numbness weakness or ataxia or problems with walking or coordination,  change in mood/affect or memory.            Objective:   Physical Exam  edentuous amb bf nad with min pseudowheeze on insp only   and occ vigorous throat clearing   Wt 193 10/16/2012  > 192 10/30/2012 > 193 12/05/2012 > 01/15/2013  194 > 03/06/2013  195   HEENT: edentulous  turbinates, and orophanx. Nl external ear canals without cough  reflex   NECK :  without JVD/Nodes/TM/ nl carotid upstrokes bilaterally   LUNGS: no acc muscle use, clear to A and P bilaterally without cough on insp or exp maneuvers   CV:  RRR  no s3 or murmur or increase in P2, no edema   ABD:  soft and nontender with nl excursion in the supine position. No bruits or organomegaly, bowel sounds nl  MS:  warm without deformities, calf tenderness, cyanosis or clubbing      cxr 09/30/12 report reviewed No active dz    Assessment & Plan:

## 2013-03-08 NOTE — Assessment & Plan Note (Signed)
Lack of cough resolution could mean an alternative diagnosis (neurogenic cough), persistence of the disease state(ie bronchiectasis, or inadequacy of currently available therapy (eg no rx available for non-acid gerd) - of the three,  The first seems the most likely here.  rec trial of neurontin and max acid suppression x 4 weeks  I had an extended discussion with the patient today lasting 15 to 20 minutes of a 25 minute visit on the following issues:  The standardized cough guidelines published in Chest by [place] in 2006 are still the best available and consist of a multiple step process (up to 12!) , not a single office visit,  and are intended  to address this problem logically,  with an alogrithm dependent on response to empiric treatment at  each progressive step  to determine a specific diagnosis with  minimal addtional testing needed. Therefore if adherence is an issue or can't be accurately verified,  it's very unlikely the standard evaluation and treatment will be successful here.    Furthermore, response to therapy (other than acute cough suppression, which should only be used short term with avoidance of narcotic containing cough syrups if possible), can be a gradual process for which the patient may perceive immediate benefit.  Unlike going to an eye doctor where the best perscription is almost always the first one and is immediately effective, this is almost never the case in the management of chronic cough syndromes. Therefore the patient needs to commit up front to consistently adhere to recommendations  for up to 6 weeks of therapy directed at the likely underlying problem(s) before the response can be reasonably evaluated.

## 2013-03-10 ENCOUNTER — Ambulatory Visit (HOSPITAL_BASED_OUTPATIENT_CLINIC_OR_DEPARTMENT_OTHER)
Admission: RE | Admit: 2013-03-10 | Discharge: 2013-03-11 | Disposition: A | Discharge status: Home / Self Care | Payer: Medicaid Other | Source: Ambulatory Visit | Attending: Urology | Admitting: Urology

## 2013-03-10 ENCOUNTER — Encounter (HOSPITAL_BASED_OUTPATIENT_CLINIC_OR_DEPARTMENT_OTHER)
Admission: RE | Disposition: A | Discharge status: Home / Self Care | Payer: Self-pay | Source: Ambulatory Visit | Attending: Urology

## 2013-03-10 ENCOUNTER — Encounter (HOSPITAL_BASED_OUTPATIENT_CLINIC_OR_DEPARTMENT_OTHER): Payer: Self-pay | Admitting: Anesthesiology

## 2013-03-10 ENCOUNTER — Encounter (HOSPITAL_BASED_OUTPATIENT_CLINIC_OR_DEPARTMENT_OTHER): Payer: Self-pay | Admitting: *Deleted

## 2013-03-10 ENCOUNTER — Ambulatory Visit (HOSPITAL_BASED_OUTPATIENT_CLINIC_OR_DEPARTMENT_OTHER): Payer: Medicaid Other | Admitting: Anesthesiology

## 2013-03-10 DIAGNOSIS — J45909 Unspecified asthma, uncomplicated: Secondary | ICD-10-CM | POA: Insufficient documentation

## 2013-03-10 DIAGNOSIS — N8111 Cystocele, midline: Secondary | ICD-10-CM | POA: Insufficient documentation

## 2013-03-10 DIAGNOSIS — K219 Gastro-esophageal reflux disease without esophagitis: Secondary | ICD-10-CM | POA: Insufficient documentation

## 2013-03-10 DIAGNOSIS — R05 Cough: Secondary | ICD-10-CM

## 2013-03-10 DIAGNOSIS — F1121 Opioid dependence, in remission: Secondary | ICD-10-CM | POA: Insufficient documentation

## 2013-03-10 DIAGNOSIS — K759 Inflammatory liver disease, unspecified: Secondary | ICD-10-CM | POA: Insufficient documentation

## 2013-03-10 DIAGNOSIS — N3941 Urge incontinence: Secondary | ICD-10-CM | POA: Insufficient documentation

## 2013-03-10 HISTORY — DX: Cough: R05

## 2013-03-10 HISTORY — DX: Opioid abuse, in remission: F11.11

## 2013-03-10 HISTORY — DX: Chronic cough: R05.3

## 2013-03-10 HISTORY — PX: ANTERIOR AND POSTERIOR REPAIR: SHX5121

## 2013-03-10 HISTORY — DX: Gastro-esophageal reflux disease without esophagitis: K21.9

## 2013-03-10 HISTORY — PX: CYSTOCELE REPAIR: SHX163

## 2013-03-10 HISTORY — DX: Personal history of other infectious and parasitic diseases: Z86.19

## 2013-03-10 HISTORY — DX: Nocturia: R35.1

## 2013-03-10 HISTORY — DX: Urgency of urination: R39.15

## 2013-03-10 HISTORY — DX: Frequency of micturition: R35.0

## 2013-03-10 HISTORY — DX: Female genital prolapse, unspecified: N81.9

## 2013-03-10 LAB — CBC
MCH: 31.1 pg (ref 26.0–34.0)
Platelets: 195 10*3/uL (ref 150–400)
RBC: 4.37 MIL/uL (ref 3.87–5.11)
RDW: 12.9 % (ref 11.5–15.5)
WBC: 10.5 10*3/uL (ref 4.0–10.5)

## 2013-03-10 LAB — CREATININE, SERUM: Creatinine, Ser: 0.56 mg/dL (ref 0.50–1.10)

## 2013-03-10 SURGERY — ANTERIOR (CYSTOCELE) AND POSTERIOR REPAIR (RECTOCELE)
Anesthesia: General | Site: Vagina

## 2013-03-10 MED ORDER — SODIUM CHLORIDE 0.9 % IJ SOLN
INTRAMUSCULAR | Status: DC | PRN
  Administered 2013-03-10: 25 mL via INTRAVENOUS

## 2013-03-10 MED ORDER — CEPHALEXIN 250 MG/5ML PO SUSR
500.0000 mg | Freq: Two times a day (BID) | ORAL | Status: DC
  Administered 2013-03-10: 500 mg via ORAL
  Filled 2013-03-10: qty 10

## 2013-03-10 MED ORDER — ONDANSETRON HCL 4 MG/2ML IJ SOLN
4.0000 mg | INTRAMUSCULAR | Status: DC | PRN
  Filled 2013-03-10: qty 2

## 2013-03-10 MED ORDER — LIDOCAINE HCL (CARDIAC) 20 MG/ML IV SOLN
INTRAVENOUS | Status: DC | PRN
  Administered 2013-03-10: 50 mg via INTRAVENOUS

## 2013-03-10 MED ORDER — ENOXAPARIN SODIUM 40 MG/0.4ML ~~LOC~~ SOLN
40.0000 mg | SUBCUTANEOUS | Status: DC
  Administered 2013-03-11: 40 mg via SUBCUTANEOUS
  Filled 2013-03-10 (×2): qty 0.4

## 2013-03-10 MED ORDER — DIPHENHYDRAMINE HCL 50 MG/ML IJ SOLN
12.5000 mg | Freq: Four times a day (QID) | INTRAMUSCULAR | Status: DC | PRN
  Filled 2013-03-10: qty 0.25

## 2013-03-10 MED ORDER — LACTATED RINGERS IV SOLN
INTRAVENOUS | Status: DC
  Administered 2013-03-10 (×3): via INTRAVENOUS
  Filled 2013-03-10: qty 1000

## 2013-03-10 MED ORDER — DIPHENHYDRAMINE HCL 12.5 MG/5ML PO ELIX
12.5000 mg | ORAL_SOLUTION | Freq: Four times a day (QID) | ORAL | Status: DC | PRN
  Filled 2013-03-10: qty 5

## 2013-03-10 MED ORDER — MIDAZOLAM HCL 5 MG/5ML IJ SOLN
INTRAMUSCULAR | Status: DC | PRN
  Administered 2013-03-10: 1 mg via INTRAVENOUS

## 2013-03-10 MED ORDER — PROPOFOL 10 MG/ML IV BOLUS
INTRAVENOUS | Status: DC | PRN
  Administered 2013-03-10: 20 mg via INTRAVENOUS
  Administered 2013-03-10: 200 mg via INTRAVENOUS

## 2013-03-10 MED ORDER — METRONIDAZOLE 0.75 % VA GEL
VAGINAL | Status: DC | PRN
  Administered 2013-03-10: 1 via VAGINAL

## 2013-03-10 MED ORDER — HYDROCODONE-ACETAMINOPHEN 5-325 MG PO TABS
1.0000 | ORAL_TABLET | ORAL | Status: DC | PRN
  Administered 2013-03-10 (×2): 1 via ORAL
  Administered 2013-03-11 (×2): 2 via ORAL
  Filled 2013-03-10: qty 2

## 2013-03-10 MED ORDER — CEFAZOLIN SODIUM-DEXTROSE 2-3 GM-% IV SOLR
2.0000 g | INTRAVENOUS | Status: AC
  Administered 2013-03-10: 2 g via INTRAVENOUS
  Filled 2013-03-10: qty 50

## 2013-03-10 MED ORDER — ZOLPIDEM TARTRATE 5 MG PO TABS
5.0000 mg | ORAL_TABLET | Freq: Every evening | ORAL | Status: DC | PRN
  Filled 2013-03-10: qty 1

## 2013-03-10 MED ORDER — GABAPENTIN 100 MG PO CAPS
100.0000 mg | ORAL_CAPSULE | Freq: Three times a day (TID) | ORAL | Status: DC
  Administered 2013-03-10 (×2): 100 mg via ORAL
  Filled 2013-03-10: qty 1

## 2013-03-10 MED ORDER — PROMETHAZINE HCL 25 MG/ML IJ SOLN
6.2500 mg | INTRAMUSCULAR | Status: DC | PRN
  Filled 2013-03-10: qty 1

## 2013-03-10 MED ORDER — METHADONE HCL 10 MG/ML PO CONC
73.0000 mg | Freq: Every morning | ORAL | Status: DC
  Filled 2013-03-10: qty 7.3

## 2013-03-10 MED ORDER — LACTATED RINGERS IV SOLN
INTRAVENOUS | Status: DC
  Filled 2013-03-10: qty 1000

## 2013-03-10 MED ORDER — SODIUM CHLORIDE 0.9 % IR SOLN
Status: DC | PRN
  Administered 2013-03-10: 10:00:00

## 2013-03-10 MED ORDER — SODIUM CHLORIDE 0.45 % IV SOLN
INTRAVENOUS | Status: DC
  Administered 2013-03-10: 17:00:00 via INTRAVENOUS
  Filled 2013-03-10: qty 1000

## 2013-03-10 MED ORDER — ALBUTEROL SULFATE HFA 108 (90 BASE) MCG/ACT IN AERS
2.0000 | INHALATION_SPRAY | Freq: Four times a day (QID) | RESPIRATORY_TRACT | Status: DC | PRN
  Filled 2013-03-10: qty 6.7

## 2013-03-10 MED ORDER — PANTOPRAZOLE SODIUM 40 MG PO TBEC
40.0000 mg | DELAYED_RELEASE_TABLET | Freq: Every day | ORAL | Status: DC
  Filled 2013-03-10: qty 1

## 2013-03-10 MED ORDER — HYDROMORPHONE HCL PF 1 MG/ML IJ SOLN
0.5000 mg | INTRAMUSCULAR | Status: DC | PRN
  Filled 2013-03-10: qty 1

## 2013-03-10 MED ORDER — INDIGOTINDISULFONATE SODIUM 8 MG/ML IJ SOLN
INTRAMUSCULAR | Status: DC | PRN
Start: 2013-03-10 — End: 2013-03-10
  Administered 2013-03-10: 5 mL via INTRAVENOUS

## 2013-03-10 MED ORDER — SENNOSIDES-DOCUSATE SODIUM 8.6-50 MG PO TABS
2.0000 | ORAL_TABLET | Freq: Every day | ORAL | Status: DC
  Administered 2013-03-10: 2 via ORAL
  Filled 2013-03-10: qty 2

## 2013-03-10 MED ORDER — FAMOTIDINE 20 MG PO TABS
20.0000 mg | ORAL_TABLET | Freq: Every day | ORAL | Status: DC
Start: 2013-03-10 — End: 2013-03-11
  Filled 2013-03-10: qty 1

## 2013-03-10 MED ORDER — ONDANSETRON HCL 4 MG/2ML IJ SOLN
INTRAMUSCULAR | Status: DC | PRN
  Administered 2013-03-10: 4 mg via INTRAVENOUS

## 2013-03-10 MED ORDER — DEXAMETHASONE SODIUM PHOSPHATE 4 MG/ML IJ SOLN
INTRAMUSCULAR | Status: DC | PRN
  Administered 2013-03-10: 8 mg via INTRAVENOUS

## 2013-03-10 MED ORDER — STERILE WATER FOR IRRIGATION IR SOLN
Status: DC | PRN
  Administered 2013-03-10: 3000 mL

## 2013-03-10 MED ORDER — BUPIVACAINE-EPINEPHRINE 0.5% -1:200000 IJ SOLN
INTRAMUSCULAR | Status: DC | PRN
  Administered 2013-03-10: 50 mL

## 2013-03-10 MED ORDER — FENTANYL CITRATE 0.05 MG/ML IJ SOLN
INTRAMUSCULAR | Status: DC | PRN
  Administered 2013-03-10: 25 ug via INTRAVENOUS
  Administered 2013-03-10 (×2): 50 ug via INTRAVENOUS
  Administered 2013-03-10 (×5): 25 ug via INTRAVENOUS
  Administered 2013-03-10 (×2): 50 ug via INTRAVENOUS
  Administered 2013-03-10 (×2): 25 ug via INTRAVENOUS

## 2013-03-10 MED ORDER — FENTANYL CITRATE 0.05 MG/ML IJ SOLN
25.0000 ug | INTRAMUSCULAR | Status: DC | PRN
  Filled 2013-03-10: qty 1

## 2013-03-10 MED ORDER — ACETAMINOPHEN 10 MG/ML IV SOLN
1000.0000 mg | Freq: Four times a day (QID) | INTRAVENOUS | Status: DC
  Administered 2013-03-10: 1000 mg via INTRAVENOUS
  Filled 2013-03-10: qty 100

## 2013-03-10 MED ORDER — KETOROLAC TROMETHAMINE 30 MG/ML IJ SOLN
30.0000 mg | Freq: Four times a day (QID) | INTRAMUSCULAR | Status: DC
  Administered 2013-03-10: 30 mg via INTRAVENOUS
  Filled 2013-03-10: qty 1

## 2013-03-10 MED ORDER — BELLADONNA ALKALOIDS-OPIUM 16.2-60 MG RE SUPP
RECTAL | Status: DC | PRN
  Administered 2013-03-10: 1 via RECTAL

## 2013-03-10 SURGICAL SUPPLY — 60 items
BAG URINE DRAINAGE (UROLOGICAL SUPPLIES) ×3 IMPLANT
BLADE SURG 10 STRL SS (BLADE) ×3 IMPLANT
BLADE SURG 15 STRL LF DISP TIS (BLADE) ×2 IMPLANT
BLADE SURG 15 STRL SS (BLADE) ×1
BLADE SURG ROTATE 9660 (MISCELLANEOUS) ×3 IMPLANT
BOOTIES KNEE HIGH SLOAN (MISCELLANEOUS) ×3 IMPLANT
CANISTER SUCTION 1200CC (MISCELLANEOUS) IMPLANT
CANISTER SUCTION 2500CC (MISCELLANEOUS) ×6 IMPLANT
CATH FOLEY 2WAY SLVR  5CC 16FR (CATHETERS) ×1
CATH FOLEY 2WAY SLVR 5CC 16FR (CATHETERS) ×2 IMPLANT
CLOTH BEACON ORANGE TIMEOUT ST (SAFETY) ×3 IMPLANT
COVER MAYO STAND STRL (DRAPES) ×3 IMPLANT
COVER TABLE BACK 60X90 (DRAPES) ×3 IMPLANT
DERMABOND ADVANCED (GAUZE/BANDAGES/DRESSINGS)
DERMABOND ADVANCED .7 DNX12 (GAUZE/BANDAGES/DRESSINGS) IMPLANT
DEVICE CAPIO SLIM SINGLE (INSTRUMENTS) ×3 IMPLANT
DEVICE CAPIO SUTURING (INSTRUMENTS)
DEVICE CAPIO SUTURING OPC (INSTRUMENTS) IMPLANT
DISSECTOR ROUND CHERRY 3/8 STR (MISCELLANEOUS) ×3 IMPLANT
DRAPE CAMERA CLOSED 9X96 (DRAPES) ×3 IMPLANT
DRAPE UNDERBUTTOCKS STRL (DRAPE) ×3 IMPLANT
FLOSEAL 10ML (HEMOSTASIS) IMPLANT
GAUZE SPONGE 4X4 16PLY XRAY LF (GAUZE/BANDAGES/DRESSINGS) IMPLANT
GLOVE BIO SURGEON STRL SZ7.5 (GLOVE) ×3 IMPLANT
GLOVE BIOGEL M 6.5 STRL (GLOVE) ×3 IMPLANT
GLOVE ECLIPSE 6.0 STRL STRAW (GLOVE) ×3 IMPLANT
GOWN PREVENTION PLUS LG XLONG (DISPOSABLE) ×3 IMPLANT
GOWN STRL REIN XL XLG (GOWN DISPOSABLE) ×3 IMPLANT
HOOK RETRACTION 12 ELAST STAY (MISCELLANEOUS) IMPLANT
NEEDLE 1/2 CIR CATGUT .05X1.09 (NEEDLE) IMPLANT
NEEDLE HYPO 22GX1.5 SAFETY (NEEDLE) ×6 IMPLANT
PACKING VAGINAL (PACKING) ×3 IMPLANT
PENCIL BUTTON HOLSTER BLD 10FT (ELECTRODE) ×3 IMPLANT
PLUG CATH AND CAP STER (CATHETERS) ×3 IMPLANT
RETRACTOR LONRSTAR 16.6X16.6CM (MISCELLANEOUS) ×2 IMPLANT
RETRACTOR STAY HOOK 5MM (MISCELLANEOUS) IMPLANT
RETRACTOR STER APS 16.6X16.6CM (MISCELLANEOUS) ×3
SET IRRIG Y TYPE TUR BLADDER L (SET/KITS/TRAYS/PACK) ×3 IMPLANT
SHEET LAVH (DRAPES) ×3 IMPLANT
SLING SOLYX SYSTEM SIS BX (SLING) IMPLANT
SPONGE LAP 4X18 X RAY DECT (DISPOSABLE) ×6 IMPLANT
SUCTION FRAZIER TIP 10 FR DISP (SUCTIONS) ×3 IMPLANT
SUT ABS MONO DBL WITH NDL 48IN (SUTURE) IMPLANT
SUT CAPIO POLYGLYCOLIC (SUTURE) IMPLANT
SUT ETHILON 2 0 PS N (SUTURE) IMPLANT
SUT MON AB 2-0 SH 27 (SUTURE)
SUT MON AB 2-0 SH27 (SUTURE) IMPLANT
SUT NONABSORB MONO DB W/NDL 48 (SUTURE) IMPLANT
SUT VIC AB 0 CT1 36 (SUTURE) IMPLANT
SUT VIC AB 2-0 CT1 27 (SUTURE) ×1
SUT VIC AB 2-0 CT1 TAPERPNT 27 (SUTURE) ×2 IMPLANT
SUT VIC AB 2-0 UR6 27 (SUTURE) ×15 IMPLANT
SYR BULB IRRIGATION 50ML (SYRINGE) ×3 IMPLANT
SYR CONTROL 10ML LL (SYRINGE) ×3 IMPLANT
SYRINGE 10CC LL (SYRINGE) ×3 IMPLANT
SYSTEM VAGINAL SUPPORT UPHOLD (Sling) ×3 IMPLANT
TRAY DSU PREP LF (CUSTOM PROCEDURE TRAY) ×3 IMPLANT
TUBE CONNECTING 12X1/4 (SUCTIONS) ×6 IMPLANT
WATER STERILE IRR 500ML POUR (IV SOLUTION) ×3 IMPLANT
YANKAUER SUCT BULB TIP NO VENT (SUCTIONS) IMPLANT

## 2013-03-10 NOTE — Progress Notes (Signed)
Ready for d/c to room.  Nurse not available.

## 2013-03-10 NOTE — H&P (Signed)
Active Problems Problems  1. Urge Incontinence Of Urine 788.31 2. 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 She returns today to review urodynamic results.  She was originally referred by [facility] for evaluation of voiding difficulty, with possible pelvic floor prolapse. She complains of urinary urge incontinence. She also complains of feeling a vaginal bulge. She notes that the initiation of urination require straining. On physical examination, the patient was noted to have a grade 3 midline cystocele. There was no enterocele, and a rectocele. Cervix and uterus were normal.   Urodynamic was accomplished on 02/03/13, in the sitting position, and shows the patient's first sensation at 265 cc, normal desire is unknown. She is strong desire at 300 cc. The patient has a bladder contraction of 39 cm of water, with severe urinary leakage. Her bladder was refilled, and the patient did not leak with abdominal leak point pressure of 122 cm of water. Pressure flow studies accomplished, and she has a flow rate of 13 cc per second. Detrusor pressure at maximal flow was 13 cm water. PVR scant. VCUG was negative.  The patient does have instability within her bladder, and her first void stimulates an unstable bladder contraction, which is triggered by Valsalva. No stress incontinence is identified. After refilling her bladder, she is able to generate a voluntary contraction and voided to completion.   Past Medical History Problems  1. History of  Arthritis V13.4 2. History of  Asthma 493.90 3. History of  Hepatitis 573.3  Surgical History Problems  1. History of  Breast Surgery Lumpectomy Bilateral 2. History of  Cesarean Section 3. History of  Cesarean Section  Current Meds 1. Methadone HCl TABS; Therapy: (Recorded:23Oct2013) to  Allergies Medication  1.  PHENobarbital TABS  Family History Problems  1. Maternal history of  Acute Myocardial Infarction V17.3 2. Sororal history of  Breast Cancer V16.3 3. Family history of  Family Health Status Number Of Children 1 son, 1 daughter 4. Family history of  Father Deceased At Age 65 5. Maternal history of  Hypertension V17.49 6. Family history of  Mother Deceased At Age 75 7. Paternal history of  Prostate Cancer V16.42  Social History Problems  1. Caffeine Use 2. Currently On Disability 3. Marital History - Single 4. Never A Smoker Denied  5. History of  Alcohol Use  Review of Systems Genitourinary, constitutional, skin, eye, otolaryngeal, hematologic/lymphatic, cardiovascular, pulmonary, endocrine, musculoskeletal, gastrointestinal, neurological and psychiatric system(s) were reviewed and pertinent findings if present are noted.  Genitourinary: urinary frequency, dysuria, incontinence and initiating urination requires straining.  Constitutional: night sweats.  Respiratory: shortness of breath and cough.  Endocrine: polydipsia.  Musculoskeletal: back pain.    Vitals Vital Signs [Data Includes: Last 1 Day]  19Feb2014 02:14PM  Blood Pressure: 147 / 85 Temperature: 98 F Heart Rate: 70  Physical Exam Constitutional: Well nourished and well developed . No acute distress.  ENT:. The ears and nose are normal in appearance.  Neck: The appearance of the neck is normal and no neck mass is present.  Pulmonary: No respiratory distress and normal respiratory rhythm and effort.  Cardiovascular:. No peripheral edema.  Abdomen: The abdomen is soft and nontender. No masses are palpated. No CVA tenderness. No hernias are palpable. No hepatosplenomegaly noted.  Genitourinary:  Chaperone Present: kim.  Examination of the external genitalia shows vulvar  atrophy, but normal female external genitalia. The urethra is is stenotic, but not tender. There is no urethral mass. Urethral hypermobility is not  present. There is no urethral discharge. There is no urethral prolapse. Vaginal exam demonstrates atrophy, but no uterine prolapse. A cystocele is present with a midline defect (grade 3 /4). No enterocele is identified. No rectocele is identified. There is no evidence of a vesicovaginal fistula. The cervix is is without abnormalities. The uterus is without abnormalities. The adnexa are palpably normal. The bladder is normal on palpation.  Lymphatics: The femoral and inguinal nodes are not enlarged or tender.  Skin: Normal skin turgor, no visible rash and no visible skin lesions.  Neuro/Psych:. Mood and affect are appropriate.    Results/Data Urine [Data Includes: Last 1 Day]   19Feb2014  COLOR YELLOW   APPEARANCE CLEAR   SPECIFIC GRAVITY 1.030   pH 6.0   GLUCOSE NEG mg/dL  BILIRUBIN NEG   KETONE NEG mg/dL  BLOOD TRACE   PROTEIN NEG mg/dL  UROBILINOGEN 1 mg/dL  NITRITE NEG   LEUKOCYTE ESTERASE SMALL   SQUAMOUS EPITHELIAL/HPF RARE   WBC 0-2 WBC/hpf  RBC 0-2 RBC/hpf  BACTERIA RARE   CRYSTALS NONE SEEN   CASTS NONE SEEN    Assessment Assessed  1. Urge Incontinence Of Urine 788.31 2. 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 She has not had hysterectomy, and, therefore cnnot participate in the [employer] POP study. However, she could still be repaired, and we have discussed the alternatives for repair, including native  tissue repair, and native tissue/augmentation  ( Xenform) repair with sacrospinus fixation; and  BS Uphold Lite sacrospinus anterior vault mesh suspension. This can be done with LMA anesthesia ( light anesthesia), and shje will have long actinb local anesthesia injected into the tisue, along with IV Tylenol. She is a recovering heorine addict, and cannot have narcotic. She can have IV Toradol. We have discussed natural orifice surgery vs open  surgery, and its' advantages. ( especially for pain control.)   Tyreanna expresses understanding of her problem, and wants to proceed with her surgical repair. She lives with her daughter at home. She will not be driving when released from her 23 hr observation, but will be at home with her daughter.    She has occasional constipation. She will take Miralax 1 week before her surgery.     She will bring her negative test results for HIV and hepatitis to the hospital with her.   Plan Health Maintenance (V70.0)  1. UA With REFLEX  Done: 19Feb2014 01:59PM Urge Incontinence Of Urine (788.31)  2. Oxybutynin Chloride ER 10 MG Oral Tablet Extended Release 24 Hour; TAKE 1 TABLET DAILY;  Therapy: 19Feb2014 to (Evaluate:17Sep2014); Last Rx:19Feb2014      Schedule Uphold Light sacrospinus repair.  Begin Ditropan XL 10mg  /day. Warning re: constipation. She will begin Miralax.   Signatures Electronically signed by : Jethro Bolus, M.D.; Feb 05 2013  3:04PM

## 2013-03-10 NOTE — Anesthesia Preprocedure Evaluation (Signed)
Anesthesia Evaluation  Patient identified by MRN, date of birth, ID band Patient awake    Reviewed: Allergy & Precautions, H&P , NPO status , Patient's Chart, lab work & pertinent test results  Airway Mallampati: II TM Distance: >3 FB Neck ROM: Full    Dental  (+) Poor Dentition and Dental Advisory Given   Pulmonary asthma (Recent URI; presently taking steroid dose pak) ,  breath sounds clear to auscultation  Pulmonary exam normal       Cardiovascular negative cardio ROS  Rhythm:Regular Rate:Normal     Neuro/Psych negative neurological ROS  negative psych ROS   GI/Hepatic GERD-  Medicated,(+)     substance abuse (Hx IV drug abuse in past; denies use in past three years; methadone dependent)  IV drug use, Hep C positive; denies HIV   Endo/Other  negative endocrine ROS  Renal/GU negative Renal ROS  negative genitourinary   Musculoskeletal negative musculoskeletal ROS (+)   Abdominal   Peds negative pediatric ROS (+)  Hematology negative hematology ROS (+)   Anesthesia Other Findings   Reproductive/Obstetrics negative OB ROS                           Anesthesia Physical Anesthesia Plan  ASA: III  Anesthesia Plan: General   Post-op Pain Management:    Induction: Intravenous  Airway Management Planned: LMA  Additional Equipment:   Intra-op Plan:   Post-operative Plan: Extubation in OR  Informed Consent: I have reviewed the patients History and Physical, chart, labs and discussed the procedure including the risks, benefits and alternatives for the proposed anesthesia with the patient or authorized representative who has indicated his/her understanding and acceptance.   Dental advisory given  Plan Discussed with: CRNA  Anesthesia Plan Comments:         Anesthesia Quick Evaluation

## 2013-03-10 NOTE — Interval H&P Note (Signed)
History and Physical Interval Note: note: Asthma and GERD. Veryb difficult IV site.  Will use Tramadol for post op pain.   03/10/2013 8:52 AM  Gloria Patterson  has presented today for surgery, with the diagnosis of Anterior Vault Prolapse  The various methods of treatment have been discussed with the patient and family. After consideration of risks, benefits and other options for treatment, the patient has consented to  Procedure(s) with comments: ANTERIOR (CYSTOCELE) AND POSTERIOR REPAIR (RECTOCELE) (N/A) -  [employer] UPHOLD LITE ANTERIOR VAULT REPAIR  POSSIBLE OP WITH OBSERVATION POSSIBLE FLOOR BED  Uphold LITE w/[employer] number [id number] qty 2 Xenform soft tissue repair matrix 6x7 cm [id number] qty 3 Capio SLIM qty 2  [employer] REP TO BE PRESENT FOR THIS CASE IN OR   as a surgical intervention .  The patient's history has been reviewed, patient examined, no change in status, stable for surgery.  I have reviewed the patient's chart and labs.  Questions were answered to the patient's satisfaction.     Jethro Bolus I

## 2013-03-10 NOTE — Progress Notes (Signed)
Assessment completed. Patient alert and oriented X3 no acute distress noted this far. Will continue to monitor.

## 2013-03-10 NOTE — Transfer of Care (Signed)
Immediate Anesthesia Transfer of Care Note  Patient: Gloria Patterson  Procedure(s) Performed: Procedure(s) (LRB): ANTERIOR (CYSTOCELE) AND POSTERIOR REPAIR (RECTOCELE) (N/A)  Patient Location: Patient transported to PACU with oxygen via face mask at 4 Liters / Min  Anesthesia Type: General  Level of Consciousness: awake and alert   Airway & Oxygen Therapy: Patient Spontanous Breathing and Patient connected to face mask oxygen  Post-op Assessment: Report given to PACU RN and Post -op Vital signs reviewed and stable  Post vital signs: Reviewed and stable  Dentition: Teeth and oropharynx remain in pre-op condition  Complications: No apparent anesthesia complications

## 2013-03-10 NOTE — Anesthesia Postprocedure Evaluation (Signed)
Anesthesia Post Note  Patient: Gloria Patterson  Procedure(s) Performed: Procedure(s) (LRB): ANTERIOR (CYSTOCELE) AND POSTERIOR REPAIR (RECTOCELE) (N/A)  Anesthesia type: General  Patient location: PACU  Post pain: Pain level controlled  Post assessment: Post-op Vital signs reviewed  Last Vitals:  Filed Vitals:   03/10/13 1038  BP: 162/98  Pulse:   Temp: 36.5 C  Resp:     Post vital signs: Reviewed  Level of consciousness: sedated  Complications: No apparent anesthesia complications

## 2013-03-10 NOTE — Op Note (Signed)
Pre-operative diagnosis :  Anterior vaginal vault prolapse, stage III  Postoperative diagnosis:  Same  Operation:   Anterior vaginal vault prolapse repair, with [employer] uphold light sacrospinous fixation using U. device and mesh repair with [name] plication  Surgeon:  S. Patsi Sears, MD  First assistant: None    Anesthesia:  general  Preparation:  After appropriate preanesthesia, the patient was brought to the operating room, placed on the operating table in the dorsal supine position where general LMA anesthesia was introduced. She was replaced in the dorsal lithotomy position where the pubis was prepped with Betadine solution, and draped in usual fashion. Armband was double checked.   Review history:   Problems  1. Urge Incontinence Of Urine 788.31  2. Vaginal Wall Prolapse With Midline Cystocele 618.01  History of Present Illness  Gloria Patterson is a 62 year old female, heroin addict, on methadone per [facility]. She returns today to review urodynamic results. She was originally referred by [facility] for evaluation of voiding difficulty, with possible pelvic floor prolapse. She complains of urinary urge incontinence. She also complains of feeling a vaginal bulge. She notes that the initiation of urination require straining. On physical examination, the patient was noted to have a grade 3 midline cystocele. There was no enterocele, and a rectocele. Cervix and uterus were normal.  Urodynamic was accomplished on 02/03/13, in the sitting position, and shows the patient's first sensation at 265 cc, normal desire is unknown. She is strong desire at 300 cc. The patient has a bladder contraction of 39 cm of water, with severe urinary leakage. Her bladder was refilled, and the patient did not leak with abdominal leak point pressure of 122 cm of water. Pressure flow studies accomplished, and she has a flow rate of 13 cc per second. Detrusor pressure at maximal flow was 13 cm water.  PVR scant. VCUG was negative.  The patient does have instability within her bladder, and her first void stimulates an unstable bladder contraction, which is triggered by Valsalva. No stress incontinence is identified. After refilling her bladder, she is able to generate a voluntary contraction and voided to completion.  Past Medical History  Problems  1. History of Arthritis V13.4  2. History of Asthma 493.90  3. History of Hepatitis 573.3    Statement of  Likelihood of Success: Excellent. TIME-OUT observed.:  Procedure:   Pelvic examination revealed stage III anterior vault prolapse, with the bladder visible at the introitus of the vagina. There was no rectocele or enterocele. The cervix was identified. The urethra was normal. The patient had some urethral stenosis which required mild dilation in Order to Pl., Foley catheter.  Self retaining retractor was placed, Foley catheter was placed, and the patient was marked for Kelly plication and anterior vault repair.  50 cc of Marcaine 0.25 with epinephrine 1-200,000 was injected in the operative site, to afford both dissection, and local anesthesia. A 5 cm incision is made 2 cm proximal to the urethra oh vesical junction, and subcutaneous tissue dissection was accomplished with both sharp and blunt dissection. A Kelly plication was accomplished using 2-0 Vicryl suture. Following this, dissection was accomplished into the pelvis, and the ischial spines were identified bilaterally. The sacrospinous ligament were identified bilaterally. Using the U. device, the uphold light mesh arms were placed through the sacrospinous ligaments bilaterally, one finger breath medial to the ischial spines. The wings were brought in standard fashion into the wound, and the uphold mesh was tacked in standard fashion with 2-0  Vicryl around the cervical fossa, and distalward around the anterior vaginal wall. The mesh was positioned, and the sleeves were incised and removed. The  mesh was smooth against the vaginal wall. No bleeding was noted. Incision was closed with running 2-0 Vicryl suture.  Indigocarmine was given, and cystoscopy showed jets of urine from both ureteral orifices. IV Toradol was given. IV Tylenol had previously been given. The patient was awakened, and taken to recovery room in good condition.

## 2013-03-11 ENCOUNTER — Encounter (HOSPITAL_BASED_OUTPATIENT_CLINIC_OR_DEPARTMENT_OTHER): Payer: Self-pay | Admitting: Urology

## 2013-03-11 MED ORDER — CIPROFLOXACIN HCL 500 MG PO TABS: 500.0000 mg | ORAL_TABLET | Freq: Two times a day (BID) | ORAL | Status: DC

## 2013-03-11 MED ORDER — TRAMADOL-ACETAMINOPHEN 37.5-325 MG PO TABS: 1.0000 | ORAL_TABLET | Freq: Four times a day (QID) | ORAL | Status: DC | PRN

## 2013-03-11 NOTE — Progress Notes (Signed)
Urology Progress Note  1 Day Post-Op   Subjective: Pt awake alert. No pain. +Voided x 2.     No acute urologic events overnight. Ambulation:   positive Flatus:    positive Bowel movement  negative  Pain: complete resolution  Objective:  Blood pressure 158/83, pulse 84, temperature 97.2 F (36.2 C), temperature source Oral, resp. rate 18, height 5\' 1"  (1.549 m), weight 87.091 kg (192 lb), SpO2 95.00%.  Physical Exam:  General:  No acute distress, awake  Genitourinary:  No bleeding Foley:out    I/O last 3 completed shifts: In: 2700 [P.O.:1200; I.V.:1500] Out: 3700 [Urine:3700]  Recent Labs     03/10/13  1455  HGB  13.6  WBC  10.5  PLT  195    Recent Labs     03/10/13  1455  CREATININE  0.56  GFRNONAA  >90  GFRAA  >90     No results found for this basename: PT, INR, APTT,  in the last 72 hours   No components found with this basename: ABG,   Assessment/Plan:  Continue any current medications. Follow up in 1 week with NP

## 2013-03-11 NOTE — Progress Notes (Signed)
Dr. Patsi Sears into see, 2 prescriptions given.

## 2013-03-11 NOTE — Progress Notes (Signed)
Foley catheter Discontinued. Clear yellow urine in drainage bag. Vaginal packing removed no active bleeding. Pt tolerated well.

## 2013-04-03 ENCOUNTER — Other Ambulatory Visit: Payer: Self-pay | Admitting: Orthopedic Surgery

## 2013-04-03 ENCOUNTER — Ambulatory Visit: Payer: Medicaid Other | Admitting: Internal Medicine

## 2013-04-04 ENCOUNTER — Encounter (HOSPITAL_COMMUNITY): Payer: Self-pay | Admitting: Pharmacy Technician

## 2013-04-07 ENCOUNTER — Encounter (HOSPITAL_COMMUNITY): Payer: Self-pay | Admitting: *Deleted

## 2013-04-07 MED ORDER — CEFAZOLIN SODIUM-DEXTROSE 2-3 GM-% IV SOLR
2.0000 g | INTRAVENOUS | Status: AC
  Administered 2013-04-08: 2 g via INTRAVENOUS
  Administered 2013-04-08: 1 g via INTRAVENOUS
  Filled 2013-04-07: qty 50

## 2013-04-07 NOTE — Progress Notes (Signed)
Pt states that she was told to arrive at 9:00 am instead of 11:30 because she needs a special intervention for IV access. Pt advised to bring in Albuterol  inhaler on day of surgery.

## 2013-04-07 NOTE — H&P (Signed)
TOTAL KNEE ADMISSION H&P  Patient is being admitted for left total knee arthroplasty.  Subjective:  Chief Complaint:left knee pain.  HPI: Gloria Patterson, 62 y.o. female, has a history of pain and functional disability in the left knee due to arthritis and has failed non-surgical conservative treatments for greater than 12 weeks to includeNSAID's and/or analgesics, corticosteriod injections, use of assistive devices and activity modification.  Onset of symptoms was gradual, starting >10 years ago with gradually worsening course since that time. The patient noted no past surgery on the left knee(s).  Patient currently rates pain in the left knee(s) at 9 out of 10 with activity. Patient has night pain, worsening of pain with activity and weight bearing, pain that interferes with activities of daily living, pain with passive range of motion, crepitus and joint swelling.  Patient has evidence of subchondral cysts, subchondral sclerosis and joint space narrowing by imaging studies. This patient has had significant increase in pain over the last several months. There is no active infection.  Patient Active Problem List   Diagnosis Date Noted  . Cough 10/16/2012   Past Medical History  Diagnosis Date  . 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 GERD (gastroesophageal reflux disease)   . Frequency of urination   . Urgency of urination   . Vaginal vault prolapse     ANTERIOR  . Nocturia   . History of hepatitis DX 1973  SECONDARY TO DRUG ABUSE--  UNKNOWN TYPE PER PT--  WAS TX'D     NO ISSURES OR S & S SINCE  . Chronic cough   . Asthma     MILD    Past Surgical History  Procedure Laterality Date  . Anterior and posterior repair N/A 03/10/2013    Procedure: ANTERIOR (CYSTOCELE) AND POSTERIOR REPAIR (RECTOCELE);  Surgeon: Kathi Ludwig, MD;  Location: [facility];  Service: Urology;  Laterality: N/A;   [employer] UPHOLD LITE ANTERIOR  VAULT REPAIR  POSSIBLE OP WITH OBSERVATION POSSIBLE FLOOR BED  Uphold LITE w/[employer] SLIM Catalog number [id number] qty 2 Xenform soft tissue repair matrix 6x7 cm [id number]   . Cystocele repair N/A 03/10/2013    Procedure:  Anterior vaginal vault prolapse repair, with [employer] uphold light sacrospinous fixation using U. device and mesh repair with [name] plication   ;  Surgeon: Kathi Ludwig, MD;  Location: [facility];  Service: Urology;  Laterality: N/A;    No prescriptions prior to admission   Allergies  Allergen Reactions  . Phenobarbital Other (See Comments)    seizures    History  Substance Use Topics  . Smoking status: Never Smoker   . Smokeless tobacco: Never Used     Comment: CANNOT HAVE NARCOTIC MEDICATIONS PER PT  . Alcohol Use: No    Family History  Problem Relation Age of Onset  . Prostate cancer Father   . Breast cancer Sister      Review of Systems  Constitutional: Negative.   HENT: Negative.   Eyes: Negative.   Respiratory: Negative.   Cardiovascular: Negative.   Gastrointestinal: Negative.   Genitourinary: Negative.   Musculoskeletal: Positive for joint pain.  Skin: Negative.   Neurological: Negative.   Endo/Heme/Allergies: Negative.   Psychiatric/Behavioral: Negative.     Objective:  Physical Exam  Constitutional: She appears well-developed.  HENT:  Head: Normocephalic.  Eyes: Pupils are equal, round, and reactive to light.  Neck: Normal range of motion.  Cardiovascular: Normal rate.   Respiratory: Effort normal.  GI: Soft.  left knee has med jlt - dp 2/4 - stable collaterals - intact extensor mech - ok skin - rom 0 - 115  Vital signs in last 24 hours:    Labs:   Estimated body mass index is 36.86 kg/(m^2) as calculated from the following:   Height as of 03/06/13: 5\' 1"  (1.549 m).   Weight as of 03/06/13: 88.451 kg (195 lb).   Imaging Review Plain radiographs demonstrate moderate degenerative joint  disease of the left knee(s). The overall alignment ismild varus. The bone quality appears to be good for age and reported activity level.  Assessment/Plan:  End stage arthritis, left knee   The patient history, physical examination, clinical judgment of the provider and imaging studies are consistent with end stage degenerative joint disease of the left knee(s) and total knee arthroplasty is deemed medically necessary. The treatment options including medical management, injection therapy arthroscopy and arthroplasty were discussed at length. The risks and benefits of total knee arthroplasty were presented and reviewed. The risks due to aseptic loosening, infection, stiffness, patella tracking problems, thromboembolic complications and other imponderables were discussed. The patient acknowledged the explanation, agreed to proceed with the plan and consent was signed. Patient is being admitted for inpatient treatment for surgery, pain control, PT, OT, prophylactic antibiotics, VTE prophylaxis, progressive ambulation and ADL's and discharge planning. The patient is planning to be discharged home with home health services Patient has history of ivda but none within past 2 yrs. Oral pain meds will be required but will be carefully monitored as is discussed with patient

## 2013-04-08 ENCOUNTER — Inpatient Hospital Stay (HOSPITAL_COMMUNITY): Payer: Medicaid Other

## 2013-04-08 ENCOUNTER — Encounter (HOSPITAL_COMMUNITY): Payer: Self-pay | Admitting: *Deleted

## 2013-04-08 ENCOUNTER — Inpatient Hospital Stay (HOSPITAL_COMMUNITY): Payer: Medicaid Other | Admitting: *Deleted

## 2013-04-08 ENCOUNTER — Encounter (HOSPITAL_COMMUNITY)
Admission: RE | Disposition: A | Discharge status: Skilled Nursing Facility | Payer: Self-pay | Source: Ambulatory Visit | Attending: Orthopedic Surgery

## 2013-04-08 ENCOUNTER — Inpatient Hospital Stay (HOSPITAL_COMMUNITY)
Admission: RE | Admit: 2013-04-08 | Discharge: 2013-04-16 | DRG: 470 | Disposition: A | Discharge status: Skilled Nursing Facility | Payer: Medicaid Other | Source: Ambulatory Visit | Attending: Orthopedic Surgery | Admitting: Orthopedic Surgery

## 2013-04-08 DIAGNOSIS — R05 Cough: Secondary | ICD-10-CM | POA: Diagnosis present

## 2013-04-08 DIAGNOSIS — J45909 Unspecified asthma, uncomplicated: Secondary | ICD-10-CM | POA: Diagnosis present

## 2013-04-08 DIAGNOSIS — IMO0002 Reserved for concepts with insufficient information to code with codable children: Secondary | ICD-10-CM

## 2013-04-08 DIAGNOSIS — Z7901 Long term (current) use of anticoagulants: Secondary | ICD-10-CM

## 2013-04-08 DIAGNOSIS — M1710 Unilateral primary osteoarthritis, unspecified knee: Principal | ICD-10-CM | POA: Diagnosis present

## 2013-04-08 DIAGNOSIS — R059 Cough, unspecified: Secondary | ICD-10-CM | POA: Diagnosis present

## 2013-04-08 DIAGNOSIS — M1712 Unilateral primary osteoarthritis, left knee: Secondary | ICD-10-CM | POA: Diagnosis present

## 2013-04-08 DIAGNOSIS — Z8619 Personal history of other infectious and parasitic diseases: Secondary | ICD-10-CM

## 2013-04-08 DIAGNOSIS — F1110 Opioid abuse, uncomplicated: Secondary | ICD-10-CM | POA: Diagnosis present

## 2013-04-08 HISTORY — PX: TOTAL KNEE ARTHROPLASTY: SHX125

## 2013-04-08 LAB — CBC
MCH: 31.4 pg (ref 26.0–34.0)
MCHC: 35.4 g/dL (ref 30.0–36.0)
Platelets: 182 10*3/uL (ref 150–400)
RDW: 13.1 % (ref 11.5–15.5)

## 2013-04-08 LAB — COMPREHENSIVE METABOLIC PANEL
AST: 73 U/L — ABNORMAL HIGH (ref 0–37)
Albumin: 3.3 g/dL — ABNORMAL LOW (ref 3.5–5.2)
Alkaline Phosphatase: 100 U/L (ref 39–117)
BUN: 12 mg/dL (ref 6–23)
Potassium: 4 mEq/L (ref 3.5–5.1)
Total Protein: 7.8 g/dL (ref 6.0–8.3)

## 2013-04-08 LAB — URINE MICROSCOPIC-ADD ON

## 2013-04-08 LAB — PROTIME-INR: Prothrombin Time: 13.3 seconds (ref 11.6–15.2)

## 2013-04-08 LAB — URINALYSIS, ROUTINE W REFLEX MICROSCOPIC
Bilirubin Urine: NEGATIVE
Glucose, UA: NEGATIVE mg/dL
Hgb urine dipstick: NEGATIVE
Ketones, ur: NEGATIVE mg/dL
Protein, ur: NEGATIVE mg/dL

## 2013-04-08 LAB — ABO/RH: ABO/RH(D): B POS

## 2013-04-08 LAB — TYPE AND SCREEN
ABO/RH(D): B POS
Antibody Screen: NEGATIVE

## 2013-04-08 LAB — SURGICAL PCR SCREEN
MRSA, PCR: NEGATIVE
Staphylococcus aureus: POSITIVE — AB

## 2013-04-08 SURGERY — ARTHROPLASTY, KNEE, TOTAL
Anesthesia: General | Site: Knee | Laterality: Left | Wound class: Clean

## 2013-04-08 MED ORDER — CHLORHEXIDINE GLUCONATE 4 % EX LIQD: 60.0000 mL | Freq: Once | CUTANEOUS | Status: DC

## 2013-04-08 MED ORDER — NEOSTIGMINE METHYLSULFATE 1 MG/ML IJ SOLN
INTRAMUSCULAR | Status: DC | PRN
  Administered 2013-04-08: 3 mg via INTRAVENOUS

## 2013-04-08 MED ORDER — LACTATED RINGERS IV SOLN
INTRAVENOUS | Status: DC | PRN
  Administered 2013-04-08 (×2): via INTRAVENOUS

## 2013-04-08 MED ORDER — HYDROMORPHONE HCL PF 1 MG/ML IJ SOLN
INTRAMUSCULAR | Status: AC
  Filled 2013-04-08: qty 1

## 2013-04-08 MED ORDER — METHADONE HCL 5 MG PO TABS: 5.0000 mg | ORAL_TABLET | Freq: Once | ORAL | Status: DC

## 2013-04-08 MED ORDER — GLYCOPYRROLATE 0.2 MG/ML IJ SOLN
INTRAMUSCULAR | Status: DC | PRN
  Administered 2013-04-08: .4 mg via INTRAVENOUS

## 2013-04-08 MED ORDER — ACETAMINOPHEN 10 MG/ML IV SOLN: 1000.0000 mg | Freq: Once | INTRAVENOUS | Status: AC | PRN

## 2013-04-08 MED ORDER — METHADONE HCL 10 MG PO TABS: 70.0000 mg | ORAL_TABLET | Freq: Once | ORAL | Status: DC

## 2013-04-08 MED ORDER — HYDROMORPHONE HCL PF 1 MG/ML IJ SOLN
INTRAMUSCULAR | Status: AC
  Filled 2013-04-08: qty 2

## 2013-04-08 MED ORDER — MIDAZOLAM HCL 5 MG/5ML IJ SOLN
INTRAMUSCULAR | Status: DC | PRN
  Administered 2013-04-08 (×2): 2 mg via INTRAVENOUS

## 2013-04-08 MED ORDER — DIPHENHYDRAMINE HCL 12.5 MG/5ML PO ELIX
12.5000 mg | ORAL_SOLUTION | Freq: Four times a day (QID) | ORAL | Status: DC | PRN
  Filled 2013-04-08: qty 5

## 2013-04-08 MED ORDER — METHADONE HCL 10 MG/ML PO CONC
73.0000 mg | ORAL | Status: DC
  Filled 2013-04-08: qty 7.3

## 2013-04-08 MED ORDER — ROCURONIUM BROMIDE 100 MG/10ML IV SOLN
INTRAVENOUS | Status: DC | PRN
  Administered 2013-04-08: 40 mg via INTRAVENOUS

## 2013-04-08 MED ORDER — KETOROLAC TROMETHAMINE 30 MG/ML IJ SOLN
INTRAMUSCULAR | Status: AC
  Administered 2013-04-08: 30 mg
  Filled 2013-04-08: qty 1

## 2013-04-08 MED ORDER — HYDROMORPHONE HCL PF 1 MG/ML IJ SOLN
0.5000 mg | Freq: Four times a day (QID) | INTRAMUSCULAR | Status: AC | PRN
  Administered 2013-04-08 (×4): 0.5 mg via INTRAVENOUS

## 2013-04-08 MED ORDER — LIDOCAINE HCL (CARDIAC) 20 MG/ML IV SOLN
INTRAVENOUS | Status: DC | PRN
  Administered 2013-04-08: 40 mg via INTRAVENOUS

## 2013-04-08 MED ORDER — 0.9 % SODIUM CHLORIDE (POUR BTL) OPTIME
TOPICAL | Status: DC | PRN
  Administered 2013-04-08: 1000 mL

## 2013-04-08 MED ORDER — METHADONE HCL 10 MG/ML PO CONC: 73.0000 mg | Freq: Every day | ORAL | Status: DC

## 2013-04-08 MED ORDER — DIAZEPAM 5 MG/ML IJ SOLN
INTRAMUSCULAR | Status: AC
  Administered 2013-04-08: 5 mg via INTRAVENOUS
  Filled 2013-04-08: qty 2

## 2013-04-08 MED ORDER — BUPIVACAINE HCL (PF) 0.25 % IJ SOLN
INTRAMUSCULAR | Status: AC
  Filled 2013-04-08: qty 10

## 2013-04-08 MED ORDER — PHENYLEPHRINE HCL 10 MG/ML IJ SOLN
INTRAMUSCULAR | Status: DC | PRN
  Administered 2013-04-08: 80 ug via INTRAVENOUS

## 2013-04-08 MED ORDER — HYDROMORPHONE 0.3 MG/ML IV SOLN
INTRAVENOUS | Status: AC
  Filled 2013-04-08: qty 25

## 2013-04-08 MED ORDER — DIPHENHYDRAMINE HCL 50 MG/ML IJ SOLN
12.5000 mg | Freq: Four times a day (QID) | INTRAMUSCULAR | Status: DC | PRN
  Filled 2013-04-08: qty 0.25

## 2013-04-08 MED ORDER — PROPOFOL 10 MG/ML IV BOLUS
INTRAVENOUS | Status: DC | PRN
  Administered 2013-04-08: 140 mg via INTRAVENOUS

## 2013-04-08 MED ORDER — MUPIROCIN CALCIUM 2 % EX CREA
TOPICAL_CREAM | Freq: Two times a day (BID) | CUTANEOUS | Status: DC
  Administered 2013-04-09 – 2013-04-15 (×11): via TOPICAL
  Filled 2013-04-08: qty 15

## 2013-04-08 MED ORDER — CLONIDINE HCL (ANALGESIA) 100 MCG/ML EP SOLN
150.0000 ug | Freq: Once | EPIDURAL | Status: DC
  Filled 2013-04-08: qty 1.5

## 2013-04-08 MED ORDER — ALBUMIN HUMAN 5 % IV SOLN
INTRAVENOUS | Status: DC | PRN
  Administered 2013-04-08: 18:00:00 via INTRAVENOUS

## 2013-04-08 MED ORDER — HYDROMORPHONE 0.3 MG/ML IV SOLN
INTRAVENOUS | Status: DC
  Administered 2013-04-09: 0.3 mg via INTRAVENOUS

## 2013-04-08 MED ORDER — HYDROMORPHONE HCL PF 1 MG/ML IJ SOLN
0.2500 mg | INTRAMUSCULAR | Status: DC | PRN
  Administered 2013-04-08 (×3): 1 mg via INTRAVENOUS

## 2013-04-08 MED ORDER — SODIUM CHLORIDE 0.9 % IJ SOLN: 9.0000 mL | INTRAMUSCULAR | Status: DC | PRN

## 2013-04-08 MED ORDER — DIAZEPAM 5 MG/ML IJ SOLN: 5.0000 mg | Freq: Once | INTRAMUSCULAR | Status: AC

## 2013-04-08 MED ORDER — ONDANSETRON HCL 4 MG/2ML IJ SOLN
INTRAMUSCULAR | Status: DC | PRN
  Administered 2013-04-08: 4 mg via INTRAVENOUS

## 2013-04-08 MED ORDER — NALOXONE HCL 0.4 MG/ML IJ SOLN
0.4000 mg | INTRAMUSCULAR | Status: DC | PRN
  Filled 2013-04-08: qty 1

## 2013-04-08 MED ORDER — LACTATED RINGERS IV SOLN
INTRAVENOUS | Status: DC | PRN
Start: 2013-04-08 — End: 2013-04-08
  Administered 2013-04-08: 15:00:00 via INTRAVENOUS

## 2013-04-08 MED ORDER — FENTANYL CITRATE 0.05 MG/ML IJ SOLN
INTRAMUSCULAR | Status: DC | PRN
  Administered 2013-04-08 (×2): 100 ug via INTRAVENOUS
  Administered 2013-04-08 (×4): 50 ug via INTRAVENOUS
  Administered 2013-04-08 (×2): 100 ug via INTRAVENOUS
  Administered 2013-04-08 (×3): 50 ug via INTRAVENOUS

## 2013-04-08 MED ORDER — LABETALOL HCL 5 MG/ML IV SOLN
INTRAVENOUS | Status: DC | PRN
  Administered 2013-04-08 (×6): 5 mg via INTRAVENOUS

## 2013-04-08 MED ORDER — MORPHINE SULFATE 4 MG/ML IJ SOLN
INTRAMUSCULAR | Status: AC
  Filled 2013-04-08: qty 1

## 2013-04-08 MED ORDER — ONDANSETRON HCL 4 MG/2ML IJ SOLN
4.0000 mg | Freq: Four times a day (QID) | INTRAMUSCULAR | Status: DC | PRN
Start: 2013-04-08 — End: 2013-04-09
  Filled 2013-04-08: qty 2

## 2013-04-08 MED ORDER — MUPIROCIN 2 % EX OINT
TOPICAL_OINTMENT | CUTANEOUS | Status: AC
  Administered 2013-04-08: 1 via NASAL
  Filled 2013-04-08: qty 22

## 2013-04-08 MED ORDER — ONDANSETRON HCL 4 MG/2ML IJ SOLN: 4.0000 mg | Freq: Once | INTRAMUSCULAR | Status: AC | PRN

## 2013-04-08 MED ORDER — SODIUM CHLORIDE 0.9 % IR SOLN
Status: DC | PRN
  Administered 2013-04-08: 6000 mL

## 2013-04-08 MED ORDER — HYDROMORPHONE HCL PF 1 MG/ML IJ SOLN
INTRAMUSCULAR | Status: AC
  Administered 2013-04-08: 1 mg via INTRAVENOUS
  Filled 2013-04-08: qty 2

## 2013-04-08 SURGICAL SUPPLY — 101 items
5.5mm non-locking screw x 70 (Screw) ×2 IMPLANT
BANDAGE ELASTIC 4 VELCRO ST LF (GAUZE/BANDAGES/DRESSINGS) IMPLANT
BANDAGE ESMARK 6X9 LF (GAUZE/BANDAGES/DRESSINGS) IMPLANT
BIT DRILL 3.2 CALIBRATED (BIT) ×1
BIT DRILL 3.2 QC DISP (BIT) ×2 IMPLANT
BIT DRILL 3.2MM CALIBRATED (BIT) ×1 IMPLANT
BIT DRILL 3.8 CALIBRATED (BIT) ×1
BIT DRILL 3.8MM CALIBRATED (BIT) ×1 IMPLANT
BLADE SAG 18X100X1.27 (BLADE) ×2 IMPLANT
BLADE SAW SGTL 13.0X1.19X90.0M (BLADE) ×2 IMPLANT
BNDG CMPR MED 10X6 ELC LF (GAUZE/BANDAGES/DRESSINGS) ×1
BNDG COHESIVE 6X5 TAN STRL LF (GAUZE/BANDAGES/DRESSINGS) IMPLANT
BNDG ELASTIC 6X10 VLCR STRL LF (GAUZE/BANDAGES/DRESSINGS) ×2 IMPLANT
BNDG ESMARK 6X9 LF (GAUZE/BANDAGES/DRESSINGS)
BOWL SMART MIX CTS (DISPOSABLE) ×2 IMPLANT
CEMENT BONE SIMPLEX SPEEDSET (Cement) ×4 IMPLANT
CLOTH BEACON ORANGE TIMEOUT ST (SAFETY) ×2 IMPLANT
COVER SURGICAL LIGHT HANDLE (MISCELLANEOUS) ×2 IMPLANT
CUFF TOURNIQUET SINGLE 34IN LL (TOURNIQUET CUFF) ×2 IMPLANT
CUFF TOURNIQUET SINGLE 44IN (TOURNIQUET CUFF) IMPLANT
DRAPE C-ARM 42X72 X-RAY (DRAPES) ×2 IMPLANT
DRAPE INCISE IOBAN 66X45 STRL (DRAPES) ×2 IMPLANT
DRAPE ORTHO SPLIT 77X108 STRL (DRAPES) ×3
DRAPE PROXIMA HALF (DRAPES) ×4 IMPLANT
DRAPE SURG ORHT 6 SPLT 77X108 (DRAPES) ×3 IMPLANT
DRAPE U-SHAPE 47X51 STRL (DRAPES) ×2 IMPLANT
DRAPE X-RAY CASS 24X20 (DRAPES) IMPLANT
DRILL BIT 3.2MM CALIBRATED (BIT) ×1
DRILL BIT 3.8MM CALIBRATED (BIT) ×1
DRSG PAD ABDOMINAL 8X10 ST (GAUZE/BANDAGES/DRESSINGS) ×2 IMPLANT
DURAPREP 26ML APPLICATOR (WOUND CARE) ×4 IMPLANT
ELECT REM PT RETURN 9FT ADLT (ELECTROSURGICAL) ×2
ELECTRODE REM PT RTRN 9FT ADLT (ELECTROSURGICAL) ×1 IMPLANT
EVACUATOR 1/8 PVC DRAIN (DRAIN) ×2 IMPLANT
FACESHIELD LNG OPTICON STERILE (SAFETY) ×2 IMPLANT
FLUID NSS /IRRIG 3000 ML XXX (IV SOLUTION) ×2 IMPLANT
GAUZE XEROFORM 5X9 LF (GAUZE/BANDAGES/DRESSINGS) IMPLANT
GLOVE BIO SURGEON STRL SZ 6.5 (GLOVE) ×2 IMPLANT
GLOVE BIOGEL PI IND STRL 6.5 (GLOVE) ×1 IMPLANT
GLOVE BIOGEL PI IND STRL 7.5 (GLOVE) ×1 IMPLANT
GLOVE BIOGEL PI IND STRL 8 (GLOVE) ×1 IMPLANT
GLOVE BIOGEL PI INDICATOR 6.5 (GLOVE) ×1
GLOVE BIOGEL PI INDICATOR 7.5 (GLOVE) ×1
GLOVE BIOGEL PI INDICATOR 8 (GLOVE) ×1
GLOVE ECLIPSE 7.0 STRL STRAW (GLOVE) ×4 IMPLANT
GLOVE SURG ORTHO 8.0 STRL STRW (GLOVE) ×2 IMPLANT
GOWN PREVENTION PLUS LG XLONG (DISPOSABLE) IMPLANT
GOWN PREVENTION PLUS XLARGE (GOWN DISPOSABLE) ×2 IMPLANT
GOWN STRL NON-REIN LRG LVL3 (GOWN DISPOSABLE) ×4 IMPLANT
GUIDEPIN 3.2  ENDO CALB STRL (PIN) ×1
GUIDEPIN 3.2 ENDO CALB STRL (PIN) ×1 IMPLANT
HANDPIECE INTERPULSE COAX TIP (DISPOSABLE) ×1
HOOD PEEL AWAY FACE SHEILD DIS (HOOD) ×8 IMPLANT
IMMOBILIZER KNEE 20 (SOFTGOODS) ×2
IMMOBILIZER KNEE 20 THIGH 36 (SOFTGOODS) ×1 IMPLANT
IMMOBILIZER KNEE 22 UNIV (SOFTGOODS) IMPLANT
IMMOBILIZER KNEE 24 THIGH 36 (MISCELLANEOUS) IMPLANT
IMMOBILIZER KNEE 24 UNIV (MISCELLANEOUS)
K-WIRE ACE 1.6X6 (WIRE) ×4
KIT BASIN OR (CUSTOM PROCEDURE TRAY) ×2 IMPLANT
KIT ROOM TURNOVER OR (KITS) ×2 IMPLANT
KWIRE ACE 1.6X6 (WIRE) ×2 IMPLANT
MANIFOLD NEPTUNE II (INSTRUMENTS) ×2 IMPLANT
MARKER SPHERE PSV REFLC THRD 5 (MARKER) IMPLANT
NEEDLE 18GX1X1/2 (RX/OR ONLY) (NEEDLE) IMPLANT
NEEDLE SPNL 18GX3.5 QUINCKE PK (NEEDLE) IMPLANT
NS IRRIG 1000ML POUR BTL (IV SOLUTION) ×2 IMPLANT
PACK TOTAL JOINT (CUSTOM PROCEDURE TRAY) ×2 IMPLANT
PAD ARMBOARD 7.5X6 YLW CONV (MISCELLANEOUS) ×4 IMPLANT
PAD CAST 4YDX4 CTTN HI CHSV (CAST SUPPLIES) IMPLANT
PADDING CAST COTTON 4X4 STRL (CAST SUPPLIES)
PADDING CAST COTTON 6X4 STRL (CAST SUPPLIES) IMPLANT
PIN SCHANZ 4MM 130MM (PIN) IMPLANT
RUBBERBAND STERILE (MISCELLANEOUS) ×2 IMPLANT
SCREW CORT 4.5X48 (Screw) ×2 IMPLANT
SCREW LOCK DIST FEM 5.5X65 (Screw) ×2 IMPLANT
SCREW LOCK PLY PROX TIB 8X25 (Screw) ×2 IMPLANT
SCREW LOCK PLY TIB 5.5X55 (Screw) ×2 IMPLANT
SCREW NLOCK CORT 4.5X40 (Screw) ×4 IMPLANT
SCREW NLOCK CORT 4.5X42 (Screw) ×4 IMPLANT
SCREW NLOCK CORT 4.5X44 (Screw) ×2 IMPLANT
SCREW NLOCK CORT STAR 4.5X38 (Screw) ×4 IMPLANT
SCREW NLOCK DIST FEM 5.5X65 (Screw) ×2 IMPLANT
SET HNDPC FAN SPRY TIP SCT (DISPOSABLE) ×1 IMPLANT
SOLUTION BETADINE 4OZ (MISCELLANEOUS) ×2 IMPLANT
SPONGE GAUZE 4X4 12PLY (GAUZE/BANDAGES/DRESSINGS) IMPLANT
SPONGE LAP 18X18 X RAY DECT (DISPOSABLE) IMPLANT
STAPLER VISISTAT 35W (STAPLE) ×2 IMPLANT
SUCTION FRAZIER TIP 10 FR DISP (SUCTIONS) ×2 IMPLANT
SUT ETHILON 3 0 PS 1 (SUTURE) ×4 IMPLANT
SUT VIC AB 0 CTB1 27 (SUTURE) ×6 IMPLANT
SUT VIC AB 1 CT1 27 (SUTURE) ×5
SUT VIC AB 1 CT1 27XBRD ANBCTR (SUTURE) ×5 IMPLANT
SUT VIC AB 2-0 CT1 27 (SUTURE) ×4
SUT VIC AB 2-0 CT1 TAPERPNT 27 (SUTURE) ×2 IMPLANT
SYR 30ML SLIP (SYRINGE) IMPLANT
SYR TB 1ML LUER SLIP (SYRINGE) IMPLANT
TOWEL OR 17X24 6PK STRL BLUE (TOWEL DISPOSABLE) ×2 IMPLANT
TOWEL OR 17X26 10 PK STRL BLUE (TOWEL DISPOSABLE) ×2 IMPLANT
TRAY FOLEY CATH 14FR (SET/KITS/TRAYS/PACK) ×2 IMPLANT
WATER STERILE IRR 1000ML POUR (IV SOLUTION) ×4 IMPLANT

## 2013-04-08 NOTE — Progress Notes (Signed)
After discussing methadone with patient, Dr. Gypsy Balsam and Dr. August Saucer, I did not administer 75mg  prescribed dose tonight because patient states she took her dose at 0600 this morning.  Due to her chronic use of methadone, we did not feel an additional dose of methadone would help her acute pain. I gave the 75 mg dose of methadone over to the care of Genevieve RN on 5N during handoff report to be given in the morning.  Pt. Denies pain at this time.

## 2013-04-08 NOTE — Progress Notes (Signed)
Dr. Gypsy Balsam at bedside.  Pt. Extremely uncomfortable and noncompliant.  When asked if she was hurting she said yes.  Dr. Gypsy Balsam ordered 2mg  IV Dilaudid.  He stated we can follow with IV Valium and then if still hurting more IV Dilaudid.  Pt. Able to answer questions and hold sats of 98% on RA.

## 2013-04-08 NOTE — Preoperative (Signed)
Beta Blockers   Reason not to administer Beta Blockers:Not Applicable 

## 2013-04-08 NOTE — Anesthesia Postprocedure Evaluation (Signed)
  Anesthesia Post-op Note  Patient: Gloria Patterson  Procedure(s) Performed: Procedure(s) with comments: TOTAL KNEE ARTHROPLASTY (Left) - Left total knee arthroplasty  Patient Location: PACU  Anesthesia Type:General  Level of Consciousness: awake  Airway and Oxygen Therapy: Patient Spontanous Breathing  Post-op Pain: mild  Post-op Assessment: Post-op Vital signs reviewed, Patient's Cardiovascular Status Stable, Respiratory Function Stable, Patent Airway, No signs of Nausea or vomiting and Pain level controlled  Post-op Vital Signs: stable  Complications: No apparent anesthesia complications

## 2013-04-08 NOTE — Transfer of Care (Signed)
Immediate Anesthesia Transfer of Care Note  Patient: Gloria Patterson  Procedure(s) Performed: Procedure(s) with comments: TOTAL KNEE ARTHROPLASTY (Left) - Left total knee arthroplasty  Patient Location: PACU  Anesthesia Type:General  Level of Consciousness: awake  Airway & Oxygen Therapy: Patient Spontanous Breathing and Patient connected to face mask oxygen  Post-op Assessment: Report given to PACU RN and Post -op Vital signs reviewed and stable  Post vital signs: Reviewed and stable  Complications: No apparent anesthesia complications

## 2013-04-08 NOTE — Interval H&P Note (Signed)
History and Physical Interval Note:  04/08/2013 2:26 PM  Gloria Patterson  has presented today for surgery, with the diagnosis of Left Knee Osteoarthritis  The various methods of treatment have been discussed with the patient and family. After consideration of risks, benefits and other options for treatment, the patient has consented to  Procedure(s) with comments: TOTAL KNEE ARTHROPLASTY (Left) - Left total knee arthroplasty as a surgical intervention .  The patient's history has been reviewed, patient examined, no change in status, stable for surgery.  I have reviewed the patient's chart and labs.  Questions were answered to the patient's satisfaction.     DEAN,GREGORY SCOTT

## 2013-04-08 NOTE — Progress Notes (Signed)
Pt. Arrived to pacu moaning and crying.  Incomprehensible words, uncooperative, rolling around in bed and at times needing to be held from disrupting and pulling at medical devices and lines.  Dr. Gypsy Balsam at bedside giving orders.

## 2013-04-08 NOTE — Anesthesia Preprocedure Evaluation (Addendum)
Anesthesia Evaluation  Patient identified by MRN, date of birth, ID band Patient awake    Reviewed: Allergy & Precautions, H&P , NPO status , Patient's Chart, lab work & pertinent test results, reviewed documented beta blocker date and time   Airway Mallampati: II      Dental  (+) Edentulous Upper and Edentulous Lower   Pulmonary asthma ,  breath sounds clear to auscultation        Cardiovascular Rhythm:Regular Rate:Normal     Neuro/Psych    GI/Hepatic GERD-  ,  Endo/Other    Renal/GU      Musculoskeletal   Abdominal   Peds  Hematology   Anesthesia Other Findings   Reproductive/Obstetrics                          Anesthesia Physical Anesthesia Plan  ASA: II  Anesthesia Plan: General   Post-op Pain Management:    Induction: Intravenous  Airway Management Planned: Oral ETT  Additional Equipment:   Intra-op Plan:   Post-operative Plan: Extubation in OR  Informed Consent: I have reviewed the patients History and Physical, chart, labs and discussed the procedure including the risks, benefits and alternatives for the proposed anesthesia with the patient or authorized representative who has indicated his/her understanding and acceptance.   Dental advisory given  Plan Discussed with: CRNA, Anesthesiologist and Surgeon  Anesthesia Plan Comments:        Anesthesia Quick Evaluation

## 2013-04-08 NOTE — Brief Op Note (Signed)
04/08/2013  7:51 PM  PATIENT:  Gloria Patterson  62 y.o. female  PRE-OPERATIVE DIAGNOSIS:  Left Knee Osteoarthritis  POST-OPERATIVE DIAGNOSIS:  Left Knee Osteoarthritis, inter op lateral condyle fracture  PROCEDURE:  Procedure(s): TOTAL KNEE ARTHROPLASTY, plate fixation lateral condyle  SURGEON:  Surgeon(s): Cammy Copa, MD  ASSISTANT: S vernon  ANESTHESIA:   general  EBL: 300 ml       BLOOD ADMINISTERED: none  DRAINS: none   LOCAL MEDICATIONS USED:  none  SPECIMEN:  No Specimen  COUNTS:  YES  TOURNIQUET:  * Missing tourniquet times found for documented tourniquets in log:  [id number] *  DICTATION: .Other Dictation: Dictation Number [id number]  PLAN OF CARE: Admit to inpatient   PATIENT DISPOSITION:  PACU - hemodynamically stable

## 2013-04-08 NOTE — Progress Notes (Signed)
Pt is finally comfortable after numerous doses of IV dilaudid, valium, toradol, and versed under the supervision of Dr. Gypsy Balsam.  Dr. Gypsy Balsam at the bedside several times since 2005.  Dr. August Saucer also at the bedside.  Care discussed with both physicians and orders executed as given.  Pt. Still arousable and denies pain at this time.  In addition, methadone discussed with both physicians and agreed to give home methadone dose to assist with pain control.

## 2013-04-09 LAB — PROTIME-INR
INR: 1.09 (ref 0.00–1.49)
Prothrombin Time: 14 s (ref 11.6–15.2)

## 2013-04-09 LAB — CBC
HCT: 29.3 % — ABNORMAL LOW (ref 36.0–46.0)
MCHC: 35.8 g/dL (ref 30.0–36.0)
RDW: 13.2 % (ref 11.5–15.5)

## 2013-04-09 LAB — BASIC METABOLIC PANEL
BUN: 7 mg/dL (ref 6–23)
Calcium: 8.3 mg/dL — ABNORMAL LOW (ref 8.4–10.5)
GFR calc Af Amer: >90 mL/min (ref >90)
GFR calc non Af Amer: >90 mL/min (ref >90)
Potassium: 3.5 mEq/L (ref 3.5–5.1)

## 2013-04-09 MED ORDER — METHOCARBAMOL 100 MG/ML IJ SOLN
500.0000 mg | Freq: Four times a day (QID) | INTRAVENOUS | Status: DC | PRN
  Filled 2013-04-09: qty 5

## 2013-04-09 MED ORDER — METOCLOPRAMIDE HCL 10 MG PO TABS
5.0000 mg | ORAL_TABLET | Freq: Three times a day (TID) | ORAL | Status: DC | PRN
  Administered 2013-04-12: 10 mg via ORAL
  Filled 2013-04-09: qty 1

## 2013-04-09 MED ORDER — DARIFENACIN HYDROBROMIDE ER 15 MG PO TB24
15.0000 mg | ORAL_TABLET | Freq: Every day | ORAL | Status: DC
  Administered 2013-04-09 – 2013-04-15 (×7): 15 mg via ORAL
  Filled 2013-04-09 (×8): qty 1

## 2013-04-09 MED ORDER — MENTHOL 3 MG MT LOZG: 1.0000 | LOZENGE | OROMUCOSAL | Status: DC | PRN

## 2013-04-09 MED ORDER — WARFARIN - PHARMACIST DOSING INPATIENT: Freq: Every day | Status: DC

## 2013-04-09 MED ORDER — KCL IN DEXTROSE-NACL 20-5-0.45 MEQ/L-%-% IV SOLN
INTRAVENOUS | Status: DC
  Administered 2013-04-09: 01:00:00 via INTRAVENOUS
  Filled 2013-04-09 (×2): qty 1000

## 2013-04-09 MED ORDER — WARFARIN VIDEO: Freq: Once | Status: DC

## 2013-04-09 MED ORDER — ALBUTEROL SULFATE HFA 108 (90 BASE) MCG/ACT IN AERS
2.0000 | INHALATION_SPRAY | Freq: Four times a day (QID) | RESPIRATORY_TRACT | Status: DC | PRN
  Filled 2013-04-09: qty 6.7

## 2013-04-09 MED ORDER — ACETAMINOPHEN 650 MG RE SUPP: 650.0000 mg | Freq: Four times a day (QID) | RECTAL | Status: DC | PRN

## 2013-04-09 MED ORDER — GABAPENTIN 100 MG PO CAPS
100.0000 mg | ORAL_CAPSULE | Freq: Three times a day (TID) | ORAL | Status: DC
  Administered 2013-04-09 – 2013-04-15 (×21): 100 mg via ORAL
  Filled 2013-04-09 (×24): qty 1

## 2013-04-09 MED ORDER — COUMADIN BOOK
Freq: Once | Status: DC
  Filled 2013-04-09: qty 1

## 2013-04-09 MED ORDER — FLEET ENEMA 7-19 GM/118ML RE ENEM: 1.0000 | ENEMA | Freq: Once | RECTAL | Status: AC | PRN

## 2013-04-09 MED ORDER — DIPHENHYDRAMINE HCL 12.5 MG/5ML PO ELIX: 12.5000 mg | ORAL_SOLUTION | ORAL | Status: DC | PRN

## 2013-04-09 MED ORDER — CEFAZOLIN SODIUM 1-5 GM-% IV SOLN
1.0000 g | Freq: Four times a day (QID) | INTRAVENOUS | Status: AC
  Administered 2013-04-09 (×2): 1 g via INTRAVENOUS
  Filled 2013-04-09 (×2): qty 50

## 2013-04-09 MED ORDER — ONDANSETRON HCL 4 MG PO TABS
4.0000 mg | ORAL_TABLET | Freq: Four times a day (QID) | ORAL | Status: DC | PRN
  Administered 2013-04-11: 4 mg via ORAL
  Filled 2013-04-09: qty 1

## 2013-04-09 MED ORDER — SENNOSIDES-DOCUSATE SODIUM 8.6-50 MG PO TABS: 1.0000 | ORAL_TABLET | Freq: Every evening | ORAL | Status: DC | PRN

## 2013-04-09 MED ORDER — METOCLOPRAMIDE HCL 5 MG/ML IJ SOLN
5.0000 mg | Freq: Three times a day (TID) | INTRAMUSCULAR | Status: DC | PRN
  Administered 2013-04-11: 10 mg via INTRAVENOUS
  Filled 2013-04-09: qty 2

## 2013-04-09 MED ORDER — FAMOTIDINE 20 MG PO TABS
20.0000 mg | ORAL_TABLET | Freq: Every evening | ORAL | Status: DC
  Administered 2013-04-09 – 2013-04-15 (×7): 20 mg via ORAL
  Filled 2013-04-09 (×8): qty 1

## 2013-04-09 MED ORDER — METHOCARBAMOL 500 MG PO TABS
500.0000 mg | ORAL_TABLET | Freq: Four times a day (QID) | ORAL | Status: DC | PRN
  Administered 2013-04-09 – 2013-04-16 (×9): 500 mg via ORAL
  Filled 2013-04-09 (×9): qty 1

## 2013-04-09 MED ORDER — METHADONE HCL 10 MG PO TABS
20.0000 mg | ORAL_TABLET | Freq: Four times a day (QID) | ORAL | Status: DC
  Administered 2013-04-09 – 2013-04-16 (×28): 20 mg via ORAL
  Filled 2013-04-09 (×5): qty 2
  Filled 2013-04-09: qty 1
  Filled 2013-04-09 (×13): qty 2
  Filled 2013-04-09: qty 1
  Filled 2013-04-09 (×7): qty 2

## 2013-04-09 MED ORDER — OXYCODONE HCL 5 MG PO TABS
5.0000 mg | ORAL_TABLET | ORAL | Status: DC | PRN
  Administered 2013-04-09 – 2013-04-11 (×9): 15 mg via ORAL
  Administered 2013-04-11: 5 mg via ORAL
  Administered 2013-04-11: 15 mg via ORAL
  Administered 2013-04-11: 10 mg via ORAL
  Administered 2013-04-12 – 2013-04-15 (×12): 15 mg via ORAL
  Administered 2013-04-15 – 2013-04-16 (×2): 10 mg via ORAL
  Filled 2013-04-09: qty 3
  Filled 2013-04-09: qty 2
  Filled 2013-04-09 (×3): qty 3
  Filled 2013-04-09: qty 2
  Filled 2013-04-09 (×5): qty 3
  Filled 2013-04-09: qty 1
  Filled 2013-04-09: qty 3
  Filled 2013-04-09: qty 2
  Filled 2013-04-09 (×4): qty 3
  Filled 2013-04-09: qty 2
  Filled 2013-04-09 (×5): qty 3
  Filled 2013-04-09: qty 1
  Filled 2013-04-09 (×3): qty 3

## 2013-04-09 MED ORDER — DOCUSATE SODIUM 100 MG PO CAPS
100.0000 mg | ORAL_CAPSULE | Freq: Two times a day (BID) | ORAL | Status: DC
  Administered 2013-04-09 – 2013-04-15 (×15): 100 mg via ORAL
  Filled 2013-04-09 (×16): qty 1

## 2013-04-09 MED ORDER — ZOLPIDEM TARTRATE 5 MG PO TABS: 5.0000 mg | ORAL_TABLET | Freq: Every evening | ORAL | Status: DC | PRN

## 2013-04-09 MED ORDER — PANTOPRAZOLE SODIUM 40 MG PO TBEC
40.0000 mg | DELAYED_RELEASE_TABLET | Freq: Every day | ORAL | Status: DC
  Administered 2013-04-09 – 2013-04-15 (×7): 40 mg via ORAL
  Filled 2013-04-09 (×7): qty 1

## 2013-04-09 MED ORDER — PHENOL 1.4 % MT LIQD: 1.0000 | OROMUCOSAL | Status: DC | PRN

## 2013-04-09 MED ORDER — BISACODYL 10 MG RE SUPP: 10.0000 mg | Freq: Every day | RECTAL | Status: DC | PRN

## 2013-04-09 MED ORDER — ACETAMINOPHEN 325 MG PO TABS: 650.0000 mg | ORAL_TABLET | Freq: Four times a day (QID) | ORAL | Status: DC | PRN

## 2013-04-09 MED ORDER — WARFARIN SODIUM 7.5 MG PO TABS
7.5000 mg | ORAL_TABLET | Freq: Once | ORAL | Status: AC
  Administered 2013-04-09: 7.5 mg via ORAL
  Filled 2013-04-09: qty 1

## 2013-04-09 MED ORDER — HYDROMORPHONE HCL PF 1 MG/ML IJ SOLN
0.5000 mg | Freq: Four times a day (QID) | INTRAMUSCULAR | Status: DC | PRN
  Administered 2013-04-09: 0.5 mg via INTRAVENOUS
  Filled 2013-04-09: qty 1

## 2013-04-09 MED ORDER — ONDANSETRON HCL 4 MG/2ML IJ SOLN: 4.0000 mg | Freq: Four times a day (QID) | INTRAMUSCULAR | Status: DC | PRN

## 2013-04-09 NOTE — Progress Notes (Signed)
Physical Therapy Treatment Patient Details Name: Gloria Patterson MRN: [id number] DOB: [birth year] Today's Date: 04/09/2013 Time: 4098-1191 PT Time Calculation (min): 17 min  PT Assessment / Plan / Recommendation Comments on Treatment Session  Improved mobility and transfers from first session today; Still with concerns re: ability to manage safely at home alone for large protions of the day    Follow Up Recommendations  SNF     Does the patient have the potential to tolerate intense rehabilitation     Barriers to Discharge        Equipment Recommendations  Rolling walker with 5" wheels    Recommendations for Other Services    Frequency 7X/week   Plan Discharge plan remains appropriate    Precautions / Restrictions Precautions Precautions: Fall Required Braces or Orthoses: Knee Immobilizer - Left Knee Immobilizer - Left: Other (comment) (until dc'd) Restrictions Weight Bearing Restrictions: Yes LLE Weight Bearing: Weight bearing as tolerated   Pertinent Vitals/Pain Reports is a little better; still greater than 5/10    Mobility  Bed Mobility Bed Mobility: Supine to Sit;Sitting - Scoot to Edge of Bed Supine to Sit: 4: Min assist;HOB elevated Sitting - Scoot to [place] of Bed: 4: Min assist Details for Bed Mobility Assistance: Improving ability to lift trunk form bed; used rails; cues for technique Transfers Transfers: Sit to Stand;Stand to Sit Sit to Stand: 1: +2 Total assist;From bed;With upper extremity assist Sit to Stand: Patient Percentage: 70% Stand to Sit: 1: +2 Total assist;To chair/3-in-1 Stand to Sit: Patient Percentage: 70% Details for Transfer Assistance: Cues for technique; much improved rise from seated surface with good use of UE push Ambulation/Gait Ambulation/Gait Assistance: 1: +2 Total assist Ambulation/Gait: Patient Percentage: 70% Ambulation Distance (Feet): 5 Feet (bed to [facility] to recliner) Assistive device: Rolling walker Ambulation/Gait  Assistance Details: Cues for sequence and posture; physical assist to move RW Gait Pattern: Step-to pattern    Exercises     PT Diagnosis:    PT Problem List:   PT Treatment Interventions:     PT Goals Acute Rehab PT Goals Time For Goal Achievement: 04/23/13 Potential to Achieve Goals: Good Pt will go Supine/Side to Sit: with modified independence PT Goal: Supine/Side to Sit - Progress: Progressing toward goal Pt will go Sit to Supine/Side: with modified independence Pt will go Sit to Stand: with modified independence PT Goal: Sit to Stand - Progress: Progressing toward goal Pt will go Stand to Sit: with modified independence PT Goal: Stand to Sit - Progress: Progressing toward goal Pt will Ambulate: >150 feet;with modified independence;with rolling walker PT Goal: Ambulate - Progress: Progressing toward goal  Visit Information  Last PT Received On: 04/09/13 Assistance Needed: +2    Subjective Data  Subjective: Needed much persuasion to get up, agreeable to get to [facility], then recliner Patient Stated Goal: to walk   Cognition  Cognition Arousal/Alertness: Awake/alert Behavior During Therapy: WFL for tasks assessed/performed Overall Cognitive Status: Within Functional Limits for tasks assessed    Balance     End of Session PT - End of Session Equipment Utilized During Treatment: Gait belt;Left knee immobilizer Activity Tolerance: Patient tolerated treatment well Patient left: in chair;with call bell/phone within reach Nurse Communication: Mobility status   GP     Olen Pel [place], [place] [phone]  04/09/2013, 4:45 PM

## 2013-04-09 NOTE — Plan of Care (Signed)
Problem: Consults Goal: Diagnosis- Total Joint Replacement Primary Total Knee Left     

## 2013-04-09 NOTE — Progress Notes (Signed)
UR COMPLETED  

## 2013-04-09 NOTE — Progress Notes (Addendum)
Clinical Social Work Department  CLINICAL SOCIAL WORK PLACEMENT NOTE  04/09/2013 Patient:Gloria Patterson  Account Number:  [id number] Admit date: 04/08/13  Clinical Social Worker: Sabino Niemann LCSWA Date/time: 11/24/2012 11:30 AM  Clinical Social Work is seeking post-discharge placement for this patient at the following level of care: SKILLED NURSING (*CSW will update this form in Epic as items are completed)  4/23/2014Patient/family provided with [facility] Department of Clinical Social Work's list of facilities offering this level of care within the geographic area requested by the patient (or if unable, by the patient's family).   4/23/2014Patient/family informed of their freedom to choose among providers that offer the needed level of care, that participate in Medicare, Medicaid or managed care program needed by the patient, have an available bed and are willing to accept the patient.  04/09/2013 Patient/family informed of [facility]' ownership interest in [facility], as well as of the fact that they are under no obligation to receive care at this facility.  PASARR submitted to [employer] on  04/11/13 PASARR number received from [employer] on  04/11/13 FL2 transmitted to all facilities in geographic area requested by pt/family on 04/09/2013 FL2 transmitted to all facilities within larger geographic area on  Patient informed that his/her managed care company has contracts with or will negotiate with certain facilities, including the following:  Patient/family informed of bed offers received:   Patient chooses bed at [facility] with an LOG Physician recommends and patient chooses bed at  Patient to be transferred to on 04/16/2013 Patient to be transferred to facility by [facility] The following physician request were entered in Epic:  Additional Comments:

## 2013-04-09 NOTE — Progress Notes (Signed)
ANTICOAGULATION CONSULT NOTE - Initial Consult  Pharmacy Consult for coumadin Indication: VTE prophylaxis  Allergies  Allergen Reactions  . Phenobarbital Other (See Comments)    seizures    Patient Measurements: Height: 5\' 2"  (157.5 cm) Weight: 189 lb 9.5 oz (86 kg) IBW/kg (Calculated) : 50.1   Vital Signs: Temp: 97.8 F (36.6 C) (04/22 2245) BP: 151/72 mmHg (04/22 2300) Pulse Rate: 90 (04/22 2300)  Labs:  Recent Labs  04/08/13 1117 04/08/13 1151  HGB  --  13.8  HCT  --  39.0  PLT  --  182  APTT 29  --   LABPROT 13.3  --   INR 1.02  --   CREATININE  --  0.62    Estimated Creatinine Clearance: 73.3 ml/min (by C-G formula based on Cr of 0.62).   Medical History: Past Medical History  Diagnosis Date  . 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 GERD (gastroesophageal reflux disease)   . Frequency of urination   . Urgency of urination   . Vaginal vault prolapse     ANTERIOR  . Nocturia   . History of hepatitis DX 1973  SECONDARY TO DRUG ABUSE--  UNKNOWN TYPE PER PT--  WAS TX'D     NO ISSURES OR S & S SINCE  . Chronic cough   . Asthma     MILD    Medications:  Prescriptions prior to admission  Medication Sig Dispense Refill  . albuterol (PROVENTIL HFA) 108 (90 BASE) MCG/ACT inhaler Inhale 2 puffs into the lungs every 6 (six) hours as needed for shortness of breath.       . famotidine (PEPCID) 20 MG tablet Take 20 mg by mouth every evening.      . gabapentin (NEURONTIN) 100 MG capsule Take 100 mg by mouth 3 (three) times daily.      . methadone (DOLOPHINE) 10 MG/ML solution Take 73 mg by mouth daily.      Marland Kitchen omeprazole (PRILOSEC) 20 MG capsule Take 20 mg by mouth every morning.      . solifenacin (VESICARE) 10 MG tablet Take 10 mg by mouth daily.        Assessment: 62 yo lady s/p TKA to start couamdin for VTE prophylaxis.  Baseline Hg 13.8, PTLC 182 and INR 1.02 Goal of Therapy:  INR 2-3 Monitor platelets by anticoagulation  protocol: Yes   Plan:  Coumadin 7.5 mg po today at 1800 Daily PT/INR. Coumadin education materials.  Deola Rewis Poteet 04/09/2013,12:35 AM

## 2013-04-09 NOTE — Evaluation (Signed)
Occupational Therapy Evaluation Patient Details Name: Gloria Patterson MRN: [id number] DOB: 10/05/1951 Today's Date: 04/09/2013 Time: 1191-4782 OT Time Calculation (min): 26 min  OT Assessment / Plan / Recommendation Clinical Impression    62 yo female s/p LTKA. Pt at Total A level for LB ADLs. Pt will benefit from OT in the acute setting for the below problem list. Recommend SNF for d/c as pt will be home alone during the day.      OT Assessment  Patient needs continued OT Services    Follow Up Recommendations  SNF    Barriers to Discharge Decreased caregiver support    Equipment Recommendations  3 in 1 bedside comode    Recommendations for Other Services    Frequency  Min 2X/week    Precautions / Restrictions Precautions Precautions: Fall Required Braces or Orthoses: Knee Immobilizer - Left Knee Immobilizer - Left: Other (comment) (until dc'd) Restrictions Weight Bearing Restrictions: Yes LLE Weight Bearing: Weight bearing as tolerated   Pertinent Vitals/Pain 8/10 L knee; PCA used     ADL  Eating/Feeding: Independent Where Assessed - Eating/Feeding: Chair Grooming: Set up Where Assessed - Grooming: Supported sitting Upper Body Bathing: Set up Where Assessed - Upper Body Bathing: Supported sitting Lower Body Bathing: +2 Total assistance Lower Body Bathing: Patient Percentage: 20% (could wash upper thighs) Where Assessed - Lower Body Bathing: Supported sit to stand Where Assessed - Upper Body Dressing: Supported sitting Lower Body Dressing: +2 Total assistance Lower Body Dressing: Patient Percentage: 0% Where Assessed - Lower Body Dressing: Supported sit to Pharmacist, hospital: +2 Total assistance Toilet Transfer: Patient Percentage: 50% Statistician Method: Sit to Barista: Raised toilet seat with arms (or 3-in-1 over toilet) Toileting - Clothing Manipulation and Hygiene: +1 Total assistance Where Assessed - Glass blower/designer  Manipulation and Hygiene: Standing Tub/Shower Transfer Method: Not assessed Equipment Used: Gait belt;Knee Immobilizer;Rolling walker ADL Comments: Pt at Total A level for LB ADLs.  Pt limited in session due to pain    OT Diagnosis: Acute pain  OT Problem List: Decreased strength;Impaired balance (sitting and/or standing);Decreased activity tolerance;Decreased range of motion;Decreased knowledge of use of DME or AE;Decreased knowledge of precautions;Pain OT Treatment Interventions: Self-care/ADL training;DME and/or AE instruction;Therapeutic activities;Balance training;Patient/family education   OT Goals Acute Rehab OT Goals OT Goal Formulation: With patient Time For Goal Achievement: 04/16/13 Potential to Achieve Goals: Good ADL Goals Pt Will Perform Grooming: with min assist;Standing at sink ADL Goal: Grooming - Progress: Goal set today Pt Will Perform Lower Body Bathing: with min assist;Sit to stand from chair ADL Goal: Lower Body Bathing - Progress: Goal set today Pt Will Perform Lower Body Dressing: with min assist;Sit to stand from bed;Sit to stand from chair ADL Goal: Lower Body Dressing - Progress: Goal set today Pt Will Transfer to Toilet: with min assist;with DME ADL Goal: Toilet Transfer - Progress: Goal set today Pt Will Perform Toileting - Clothing Manipulation: with min assist;Standing ADL Goal: Toileting - Clothing Manipulation - Progress: Goal set today Pt Will Perform Toileting - Hygiene: with min assist;Sit to stand from 3-in-1/toilet ADL Goal: Toileting - Hygiene - Progress: Goal set today Pt Will Perform Tub/Shower Transfer: Tub transfer;with min assist;with DME ADL Goal: Tub/Shower Transfer - Progress: Goal set today  Visit Information  Last OT Received On: 04/09/13 Assistance Needed: +2 PT/OT Co-Evaluation/Treatment: Yes    Subjective Data      Prior Functioning     Home Living Lives With: Daughter (daugther works 1st shift)  Available Help at  Discharge: Family;Available PRN/intermittently (daughter works 1st shift) Type of Home: House Home Access: Stairs to enter Secretary/administrator of Steps: 1 Entrance Stairs-Rails: None Home Layout: One level Bathroom Shower/Tub: Counselling psychologist: Yes How Accessible: Accessible via walker Home Adaptive Equipment: Straight cane Prior Function Level of Independence: Needs assistance Needs Assistance: Bathing;Dressing;Meal Prep;Light Housekeeping Communication Communication: No difficulties         Vision/Perception     Cognition  Cognition Arousal/Alertness: Awake/alert Behavior During Therapy: WFL for tasks assessed/performed Overall Cognitive Status: Within Functional Limits for tasks assessed    Extremity/Trunk Assessment Right Upper Extremity Assessment RUE ROM/Strength/Tone: WFL for tasks assessed Left Upper Extremity Assessment LUE ROM/Strength/Tone: WFL for tasks assessed     Mobility Bed Mobility Bed Mobility: Supine to Sit;Sitting - Scoot to Edge of Bed Supine to Sit: 3: Mod assist;With rails;HOB elevated Sitting - Scoot to Edge of Bed: 3: Mod assist Details for Bed Mobility Assistance: Heavy mod assist, especially for elevating trunk from bed and supporting LLE once foot cleared EOB Transfers Transfers: Sit to Stand;Stand to Sit Sit to Stand: 1: +2 Total assist;From bed Sit to Stand: Patient Percentage: 50% Stand to Sit: 1: +2 Total assist;To chair/3-in-1 Stand to Sit: Patient Percentage: 50% Details for Transfer Assistance: Cues for technique; Required extensive anti-gravity assist with sit to stand; still able to stabilize with UEs on RW once standing; Cues for technique, hand placement        Balance     End of Session OT - End of Session Equipment Utilized During Treatment: Gait belt;Left knee immobilizer Activity Tolerance: Patient limited by pain Patient left: in chair;with call bell/phone  within reach Nurse Communication: Mobility status CPM Left Knee CPM Left Knee: Off  GO     Earlie Raveling OTR/L [phone] 04/09/2013, 2:24 PM

## 2013-04-09 NOTE — Progress Notes (Signed)
Subjective: Pt stable - comfortable on pca   Objective: Vital signs in last 24 hours: Temp:  [97.8 F (36.6 C)-98.2 F (36.8 C)] 97.8 F (36.6 C) (04/22 2245) Pulse Rate:  [71-95] 90 (04/22 2300) Resp:  [11-26] 20 (04/23 0400) BP: (146-170)/(59-133) 151/72 mmHg (04/22 2300) SpO2:  [92 %-99 %] 98 % (04/23 0400) Weight:  [86 kg (189 lb 9.5 oz)] 86 kg (189 lb 9.5 oz) (04/22 1117)  Intake/Output from previous day: 04/22 0701 - 04/23 0700 In: 2450 [I.V.:2200; IV Piggyback:250] Out: 3000 [Urine:2800; Blood:200] Intake/Output this shift:    Exam:  Neurovascular intact Sensation intact distally Intact pulses distally Dorsiflexion/Plantar flexion intact  Labs:  Recent Labs  04/08/13 1151  HGB 13.8    Recent Labs  04/08/13 1151  WBC 7.9  RBC 4.39  HCT 39.0  PLT 182    Recent Labs  04/08/13 1151  NA 141  K 4.0  CL 104  CO2 24  BUN 12  CREATININE 0.62  GLUCOSE 136*  CALCIUM 9.7    Recent Labs  04/08/13 1117  INR 1.02    Assessment/Plan: Inter op fracture of lateral condyle discussed with patient - plan to resume methadone today 25 po tid - PT and CPM today - po pain meds - no tylenol po    DEAN,GREGORY SCOTT 04/09/2013, 7:57 AM

## 2013-04-09 NOTE — Progress Notes (Signed)
Orthopedic Tech Progress Note Patient Details:  Karoline Fleer [facility] 11/19/1951 [id number]  CPM Left Knee CPM Left Knee: On Left Knee Flexion (Degrees): 60 Left Knee Extension (Degrees): 0 Trapeze bar patient helper  Nikki Dom 04/09/2013, 10:17 AM

## 2013-04-09 NOTE — Op Note (Signed)
NAMEVICKIE, MELNIK NO.:  [id number]  MEDICAL RECORD NO.:  [id number]  LOCATION:  [id number]                        FACILITY:  [facility]  PHYSICIAN:  Burnard Bunting, M.D.    DATE OF BIRTH:  [birth year]  DATE OF PROCEDURE: DATE OF DISCHARGE:                              OPERATIVE REPORT   PREOPERATIVE DIAGNOSIS:  Left knee arthritis.  POSTOPERATIVE DIAGNOSIS:  Left knee arthritis, intraoperative lateral condyle fracture.  PROCEDURE:  Left total knee replacement with intraoperative plate fixation of lateral condyle fracture.  SURGEON:  Burnard Bunting, M.D.  ASSISTANT:  Maud Deed.  ANESTHESIA:  General endotracheal.  ESTIMATED BLOOD LOSS:  300 mL.  DRAINS:  None.  TOURNIQUET TIME:  2 hours at 300 mmHg.  INDICATIONS:  Gloria Patterson is a 62 year old patient with end-stage left knee arthritis presents for operative management after explanation of risks and benefits including not limited to infection, nerve or vessel damage, knee stiffness, other potential complications.  COMPONENTS IMPLANTED:  Stryker triathlon posterior cruciate sacrificing size 3 femur, 3 tibia, 11 poly insert, 3 peg polyethylene patella with Biomet lateral condyle plate 6 hole.  PROCEDURE IN DETAIL:  The patient was brought to the operating room, where general endotracheal anesthesia was induced.  Preoperative antibiotics were administered.  Time-out was called.  Left leg was prescribed with alcohol and Betadine, which was allowed to air dry, prepped with DuraPrep solution, draped in sterile manner.  Time-out was called.  Collier Flowers was used to cover the operative field.  Leg was elevated and exsanguinated with Esmarch wrap.  Tourniquet was inflated.  Anterior approach to knee was made.  The patient had preoperatively about 10- degree flexion contracture, and mild varus alignment.  Range of motion was to about 115-120 of flexion.  Anterior approach to the knee was made.  Parapatellar  arthrotomy was made, marked with a #1 Vicryl suture. Soft tissue elevated off the anterior distal femur.  Proximal medial tibial plateau, subperiosteal elevation of medial structures were performed.  ACL and PCL released.  Intramedullary alignment was then used to cut the tibia perpendicular mechanical axis.  First cut was 9 mm off the least affected lateral tibial plateau.  This was insufficient and an additional 2 mm was cut.  An intramedullary alignment was then used on the femur with a 5-degree left cutting guide, 10 mm was resected.  With that in position, 9-mm block could achieve full extension with good alignment on the femur.  Chamfer and box cuts were then made after the femur was sized to a size 3.  The tibia was also initially sized to a 4.  With the trial, the tibia was then keel punched and size 4 was in position.  Box cut was then made on the femur.  The femoral trial was then placed back on the femur, however, however, in placing the femur back on the condyle, lateral condyle fracture nondisplaced was incurred.  The incision was then extended proximally. This involved essentially the entire lateral condyle extending up into the metaphysis.  Again, it was nondisplaced.  A 6-hole Biomet plate was then affixed under fluoroscopic guidance in the AP and lateral planes. Plate gave good fixation and was reasonably centered on the  femur in the lateral plane.  Stable fixation was achieved with 8 cortices proximally using nonlocking screws and 4 locking screws placed distally.  After fixation of the fracture, trial reduction was again performed, and was found to be tight in flexion even with a 9-mm spacer.  Additional 2 mm was cut from the tibia.  At this time, trial reduction was again performed and tibial tray was downsized to a size 3.  This did give good coverage.  With 3 base plate and 9-mm polyethylene trial spacer gave excellent stability in varus and valgus stress at 0, 30,  and 90 degrees. Patella was cut freehand from 0.2-14 and 3 peg polyethylene patella was placed.  After fracture fixation was achieved, tourniquet was released after 2 hours.  Thorough irrigation was then performed with 6 L of irrigating solution.  After trial reduction, trial components were removed and pouring irrigation was used.  These components were then cemented into position.  However, did up size to an 11-mm spacer to improve varus and valgus stability at 0 and 30 degrees.  Excellent stability and excellent tracking was achieved using no thumbs technique. Thorough irrigation was again performed and the incision was then closed over bolster using interrupted inverted #1 Vicryl suture, 0 Vicryl suture, 2-0 Vicryl suture, and skin staples.  Solution of Marcaine, morphine, clonidine injected into the knee.  The patient tolerated well without immediate complications.  Velna Hatchet Vernon's assistance was required initially in the case for retraction of neurovascular structures and opening.  Her assistance was a medical necessity for that part of the case.  Intraoperative fluoroscopy did demonstrate a stable fixation and reduction of the lateral condyle.  Do not anticipate that rehab should be adversely affected because of the stability of the fixation.     Burnard Bunting, M.D.     GSD/MEDQ  D:  04/08/2013  T:  04/09/2013  Job:  [id number]

## 2013-04-09 NOTE — Progress Notes (Signed)
Clinical Social Work Department  BRIEF PSYCHOSOCIAL ASSESSMENT  Patient: Gloria Patterson  Account Number: [id number] Admit date: 04/08/13 Clinical Social Worker Sabino Niemann, MSW Date/Time: 04/09/2013 1130AM Referred by: Physician Date Referred:  Referred for   SNF Placement   Other Referral:  Interview type: Patient  Other interview type: PSYCHOSOCIAL DATA  Living Status: Patient lives with her daughter Admitted from facility:  Level of care:  Primary support name: Willis,Billie Primary support relationship to patient: Daughter Degree of support available:  Poor  CURRENT CONCERNS  Current Concerns   Post-Acute Placement   Other Concerns:  SOCIAL WORK ASSESSMENT / PLAN  CSW met with pt re: PT recommendation for SNF.   Pt lives with her daughter  CSW explained placement process and answered questions.   Pt reports no facility preference at this time. Patient reported being in a lot of pain   CSW completed FL2 and initiated SNF search.     Assessment/plan status: Information/Referral to [facility]  Other assessment/ plan:  Information/referral to community resources:  SNF   PTAR  PATIENT'S/FAMILY'S RESPONSE TO PLAN OF CARE:  Pt  reports she is agreeable to [facility] SNF in order to increase strength and independence with mobility prior to returning home  Pt verbalized understanding of placement process and appreciation for CSW assist.   Sabino Niemann, MSW [phone]

## 2013-04-09 NOTE — Progress Notes (Signed)
Foley was discontinued at 0530 without any problems, pt due to void at any time.

## 2013-04-09 NOTE — Progress Notes (Signed)
Physical Therapy Evaluation Patient Details Name: Gloria Patterson MRN: [id number] DOB: 14-Feb-1951 Today's Date: 04/09/2013 Time: 4098-1191 PT Time Calculation (min): 30 min  PT Assessment / Plan / Recommendation Clinical Impression  62 yo female s/p LTKA presents to PT with decr functional mobility; Will benefit from PT to maximize independence and safety with mobility/amb, and to facilitate dc planning; Of Note -- it's worth considering post-acute rehab at SNF at dc as pt will be alone during the day and must be independent    PT Assessment  Patient needs continued PT services    Follow Up Recommendations  SNF    Does the patient have the potential to tolerate intense rehabilitation      Barriers to Discharge Decreased caregiver support      Equipment Recommendations  Rolling walker with 5" wheels    Recommendations for Other Services     Frequency 7X/week    Precautions / Restrictions Precautions Precautions: Fall Required Braces or Orthoses: Knee Immobilizer - Left Knee Immobilizer - Left:  (until dc'd) Restrictions Weight Bearing Restrictions: Yes LLE Weight Bearing: Weight bearing as tolerated   Pertinent Vitals/Pain 8/10 L knee; PCA used      Mobility  Bed Mobility Bed Mobility: Supine to Sit;Sitting - Scoot to Edge of Bed Supine to Sit: 3: Mod assist;With rails;HOB elevated Sitting - Scoot to Edge of Bed: 3: Mod assist Details for Bed Mobility Assistance: Heavy mod assist, especially for elevating trunk from bed and supporting LLE once foot cleared EOB Transfers Transfers: Sit to Stand;Stand to Sit Sit to Stand: 1: +2 Total assist;From bed Sit to Stand: Patient Percentage: 50% Stand to Sit: 1: +2 Total assist;To chair/3-in-1 Stand to Sit: Patient Percentage: 50% Details for Transfer Assistance: Cues for technique; Required extensive anti-gravity assist with sit to stand; still able to stabilize with UEs on RW once standing; Cues for technique, hand  placement Ambulation/Gait Ambulation/Gait Assistance: 1: +2 Total assist Ambulation/Gait: Patient Percentage: 70% Ambulation Distance (Feet): 2 Feet (pivot steps bed to chair) Assistive device: Rolling walker Ambulation/Gait Assistance Details: second person present for safety; pivot steps bed to chair with cues for sequence and posture    Exercises Total Joint Exercises Ankle Circles/Pumps: AROM;AAROM;Both;10 reps (required demo cues) Quad Sets: AROM;Left;5 reps;Supine Heel Slides: AAROM;Left;5 reps;Supine (very short ROM, limited by guarding)   PT Diagnosis: Difficulty walking;Acute pain;Abnormality of gait  PT Problem List: Decreased strength;Decreased range of motion;Decreased activity tolerance;Decreased balance;Decreased mobility;Decreased knowledge of use of DME;Decreased knowledge of precautions;Pain PT Treatment Interventions: DME instruction;Gait training;Stair training;Functional mobility training;Therapeutic activities;Therapeutic exercise;Patient/family education   PT Goals Acute Rehab PT Goals PT Goal Formulation: With patient Time For Goal Achievement: 04/09/13 Potential to Achieve Goals: Good Pt will go Supine/Side to Sit: with modified independence PT Goal: Supine/Side to Sit - Progress: Goal set today Pt will go Sit to Supine/Side: with modified independence PT Goal: Sit to Supine/Side - Progress: Goal set today Pt will go Sit to Stand: with modified independence PT Goal: Sit to Stand - Progress: Goal set today Pt will go Stand to Sit: with modified independence PT Goal: Stand to Sit - Progress: Goal set today Pt will Ambulate: >150 feet;with modified independence;with rolling walker PT Goal: Ambulate - Progress: Goal set today Pt will Go Up / Down Stairs: 1-2 stairs;with min assist;with rolling walker PT Goal: Up/Down Stairs - Progress: Goal set today Pt will Perform Home Exercise Program: Independently PT Goal: Perform Home Exercise Program - Progress: Goal set  today  Visit Information  Last PT Received On: 04/09/13 Assistance Needed: +2    Subjective Data  Subjective: Agreeable to OOB Patient Stated Goal: to walk   Prior Functioning  Home Living Lives With: Daughter Available Help at Discharge: Family;Available PRN/intermittently Type of Home: House Home Access: Stairs to enter [employer] of Steps: 1 Entrance Stairs-Rails: None Home Layout: One level Bathroom Shower/Tub: Forensic scientist: Standard Bathroom Accessibility: Yes How Accessible: Accessible via walker Home Adaptive Equipment: Straight cane Prior Function Level of Independence: Needs assistance Needs Assistance: Bathing;Dressing;Meal Prep;Light Housekeeping Communication Communication: No difficulties    Cognition  Cognition Arousal/Alertness: Awake/alert Behavior During Therapy: WFL for tasks assessed/performed Overall Cognitive Status: Within Functional Limits for tasks assessed    Extremity/Trunk Assessment Right Lower Extremity Assessment RLE ROM/Strength/Tone: Deficits RLE ROM/Strength/Tone Deficits: Grossly decr strength, requiring +2 assist for sit tto stand Left Lower Extremity Assessment LLE ROM/Strength/Tone: Deficits LLE ROM/Strength/Tone Deficits: Grossly decr A/P/ROM L knee, limited by pain postop; Noted strong guarding with attempt at  passive flexion   Balance    End of Session PT - End of Session Equipment Utilized During Treatment: Gait belt;Left knee immobilizer Activity Tolerance: Patient limited by pain Patient left: in chair;with call bell/phone within reach Nurse Communication: Mobility status CPM Left Knee CPM Left Knee: On Left Knee Flexion (Degrees): 60 Left Knee Extension (Degrees): 0  GP     Van Clines [facility] [place], [place] [phone]  04/09/2013, 11:39 AM

## 2013-04-10 ENCOUNTER — Encounter (HOSPITAL_COMMUNITY): Payer: Self-pay | Admitting: Orthopedic Surgery

## 2013-04-10 LAB — CBC
MCH: 31.3 pg (ref 26.0–34.0)
MCHC: 35.9 g/dL (ref 30.0–36.0)
MCV: 87.1 fL (ref 78.0–100.0)
Platelets: 151 10*3/uL (ref 150–400)
RDW: 13 % (ref 11.5–15.5)
WBC: 12.1 10*3/uL — ABNORMAL HIGH (ref 4.0–10.5)

## 2013-04-10 LAB — URINE CULTURE
Colony Count: NO GROWTH
Culture: NO GROWTH

## 2013-04-10 MED ORDER — METHOCARBAMOL 500 MG PO TABS: 500.0000 mg | ORAL_TABLET | Freq: Four times a day (QID) | ORAL | Status: DC | PRN

## 2013-04-10 MED ORDER — DSS 100 MG PO CAPS: 100.0000 mg | ORAL_CAPSULE | Freq: Two times a day (BID) | ORAL | Status: DC

## 2013-04-10 MED ORDER — WARFARIN SODIUM 7.5 MG PO TABS
7.5000 mg | ORAL_TABLET | Freq: Once | ORAL | Status: AC
  Administered 2013-04-10: 7.5 mg via ORAL
  Filled 2013-04-10: qty 1

## 2013-04-10 MED ORDER — OXYCODONE HCL 5 MG PO TABS: 5.0000 mg | ORAL_TABLET | ORAL | Status: DC | PRN

## 2013-04-10 MED ORDER — SODIUM CHLORIDE 0.9 % IJ SOLN
10.0000 mL | Freq: Two times a day (BID) | INTRAMUSCULAR | Status: DC
  Administered 2013-04-11 – 2013-04-13 (×3): 20 mL

## 2013-04-10 MED ORDER — WARFARIN SODIUM 5 MG PO TABS: 5.0000 mg | ORAL_TABLET | Freq: Every day | ORAL | Status: DC

## 2013-04-10 MED ORDER — SODIUM CHLORIDE 0.9 % IJ SOLN
10.0000 mL | INTRAMUSCULAR | Status: DC | PRN
  Administered 2013-04-10 – 2013-04-11 (×3): 10 mL
  Administered 2013-04-12: 20 mL
  Administered 2013-04-12: 10 mL
  Administered 2013-04-13 (×2): 20 mL
  Administered 2013-04-13: 10 mL
  Administered 2013-04-14 – 2013-04-15 (×3): 20 mL
  Administered 2013-04-15: 10 mL
  Administered 2013-04-16: 20 mL

## 2013-04-10 MED ORDER — METHADONE HCL 10 MG PO TABS: 20.0000 mg | ORAL_TABLET | Freq: Four times a day (QID) | ORAL | Status: DC

## 2013-04-10 NOTE — Progress Notes (Signed)
Physical Therapy Treatment Patient Details Name: Gloria Patterson MRN: [id number] DOB: Nov 06, 1951 Today's Date: 04/10/2013 Time: 1007-1020 PT Time Calculation (min): 13 min  PT Assessment / Plan / Recommendation Comments on Treatment Session  Pt's progress limited today due to c/o dizziness and nausea. Reinforced need to get OOB and mobilize. Pt vitals checked 129/68 and HR 115. Pt declined a return visit due to nausea.    Follow Up Recommendations  SNF     Does the patient have the potential to tolerate intense rehabilitation     Barriers to Discharge        Equipment Recommendations  None recommended by PT    Recommendations for Other Services    Frequency     Plan Discharge plan remains appropriate    Precautions / Restrictions Precautions Precautions: Fall Required Braces or Orthoses: Knee Immobilizer - Left Restrictions LLE Weight Bearing: Weight bearing as tolerated   Pertinent Vitals/Pain     Mobility  Bed Mobility Bed Mobility: Supine to Sit;Sitting - Scoot to Edge of Bed Supine to Sit: 4: Min assist;With rails;HOB elevated Sitting - Scoot to [place] of Bed: 4: Min assist Details for Bed Mobility Assistance: cues for technique, assistance with L LE to scoot over in the bed, increasd time needed. Transfers Transfers: Sit to Stand;Stand to Sit Sit to Stand: 4: Min assist Stand to Sit: 4: Min assist Details for Transfer Assistance: cues for technique and hand placement Ambulation/Gait Ambulation/Gait Assistance:  (+2 due to dizziness and nausea) Ambulation/Gait: Patient Percentage: 70% Ambulation Distance (Feet): 5 Feet Assistive device: Rolling walker Ambulation/Gait Assistance Details: cues for technique and hand placenment...increased dizziness with standing and c/o dizziness. Gait Pattern: Step-to pattern Gait velocity: decreased Stairs: No    Exercises     PT Diagnosis:    PT Problem List:   PT Treatment Interventions:     PT Goals Acute Rehab  PT Goals PT Goal: Supine/Side to Sit - Progress: Progressing toward goal PT Goal: Sit to Supine/Side - Progress: Progressing toward goal PT Goal: Sit to Stand - Progress: Progressing toward goal PT Goal: Stand to Sit - Progress: Progressing toward goal PT Goal: Ambulate - Progress: Not progressing (due to dizziness and nausea) PT Goal: Perform Home Exercise Program - Progress: Not progressing (not tested due to pt decline from nausea)  Visit Information  Last PT Received On: 04/10/13 Assistance Needed: +2 (due to dizziness and nausea)    Subjective Data  Subjective: I am ready to try getting up.   Cognition  Cognition Arousal/Alertness: Awake/alert Behavior During Therapy: WFL for tasks assessed/performed Overall Cognitive Status: Within Functional Limits for tasks assessed    Balance  Balance Balance Assessed: Yes Static Standing Balance Static Standing - Balance Support: Bilateral upper extremity supported Static Standing - Level of Assistance: 4: Min assist  End of Session PT - End of Session Equipment Utilized During Treatment: Gait belt;Left knee immobilizer Activity Tolerance: Treatment limited secondary to medical complications (Comment) Patient left: in chair;with call bell/phone within reach Nurse Communication: Mobility status   GP     Gloria Patterson 04/10/2013, 11:59 AM

## 2013-04-10 NOTE — Progress Notes (Signed)
Inpatient Diabetes Program Recommendations  AACE/ADA: New Consensus Statement on Inpatient Glycemic Control (2013)  Target Ranges:  Prepandial:   less than 140 mg/dL      Peak postprandial:   less than 180 mg/dL (1-2 hours)      Critically ill patients:  140 - 180 mg/dL   Results for RIDHI, HOFFERT (MRN [id number]) as of 04/10/2013 09:33  Ref. Range 04/08/2013 11:51 04/09/2013 08:59  Glucose Latest Range: 70-99 mg/dL 409 (H) 811 (H)    Inpatient Diabetes Program Recommendations Correction (SSI): Please consider ordering CBGs with Novolog correction scale ACHS. HgbA1C: Please consider ordering an A1C.  Note: Patient does not have a history of diabetes.  Initial lab glucose on 4/22 @ 11:51 was 136 mg/dl.  Lab glucose on 4/23 @ 8:59 noted to be 252 mg/dl.  Please consider ordering an A1C to determine glycemic control over the past 2-3 months and checking CBGs ACHS with Novolog sensitive correction if needed.  Will continue to follow if necessary.  Thanks, Orlando Penner, RN, BSN, CCRN Diabetes Coordinator Inpatient Diabetes Program [phone]

## 2013-04-10 NOTE — Progress Notes (Signed)
Subjective: Pt stable - pain controlled - mobilizing with PT but states she has not been in cpm   Objective: Vital signs in last 24 hours: Temp:  [97.6 F (36.4 C)-101.2 F (38.4 C)] 100.3 F (37.9 C) (04/24 0545) Pulse Rate:  [92-102] 102 (04/24 0545) Resp:  [16-18] 18 (04/24 0545) BP: (158-173)/(74-78) 158/78 mmHg (04/24 0545) SpO2:  [96 %-98 %] 97 % (04/24 0545)  Intake/Output from previous day: 04/23 0701 - 04/24 0700 In: 220 [P.O.:220] Out: 450 [Urine:450] Intake/Output this shift:    Exam:  Neurovascular intact Sensation intact distally Intact pulses distally  Labs:  Recent Labs  04/08/13 1151 04/09/13 0859 04/10/13 0540  HGB 13.8 10.5* 10.2*    Recent Labs  04/09/13 0859 04/10/13 0540  WBC 10.8* 12.1*  RBC 3.33* 3.26*  HCT 29.3* 28.4*  PLT 158 151    Recent Labs  04/08/13 1151 04/09/13 0859  NA 141 135  K 4.0 3.5  CL 104 97  CO2 24 30  BUN 12 7  CREATININE 0.62 0.59  GLUCOSE 136* 252*  CALCIUM 9.7 8.3*    Recent Labs  04/09/13 0859 04/10/13 0540  INR 1.09 1.42    Assessment/Plan: Pt making expected progress - will need snf - possibly fri vs sat - also needs cpm 6 hours per day   Weslie Pretlow SCOTT 04/10/2013, 8:08 AM

## 2013-04-10 NOTE — Progress Notes (Signed)
ANTICOAGULATION CONSULT NOTE - Follow Up Consult  Pharmacy Consult for coumadin Indication: VTE prophylaxis  Allergies  Allergen Reactions  . Phenobarbital Other (See Comments)    seizures    Patient Measurements: Height: 5\' 2"  (157.5 cm) Weight: 189 lb 9.5 oz (86 kg) IBW/kg (Calculated) : 50.1   Vital Signs: Temp: 100.3 F (37.9 C) (04/24 0545) Temp src: Oral (04/24 0545) BP: 129/68 mmHg (04/24 1017) Pulse Rate: 115 (04/24 1017)  Labs:  Recent Labs  04/08/13 1117  04/08/13 1151 04/09/13 0859 04/10/13 0540  HGB  --   < > 13.8 10.5* 10.2*  HCT  --   --  39.0 29.3* 28.4*  PLT  --   --  182 158 151  APTT 29  --   --   --   --   LABPROT 13.3  --   --  14.0 17.0*  INR 1.02  --   --  1.09 1.42  CREATININE  --   --  0.62 0.59  --   < > = values in this interval not displayed.  Estimated Creatinine Clearance: 73.3 ml/min (by C-G formula based on Cr of 0.59).  Assessment: Patient is a 62 y.o F s/p left TKR on coumadin for VTE prophylaxis.  INR is subtherapeutic but is trending up.  No bleeding noted.  Goal of Therapy:  INR 2-3    Plan:  1) coumadin 7.5mg  PO x1  Ryer Asato P 04/10/2013,1:14 PM

## 2013-04-10 NOTE — Care Management Note (Signed)
CARE MANAGEMENT NOTE 04/10/2013  Patient:  Gloria Patterson, Gloria Patterson   Account Number:  [id number]  Date Initiated:  04/10/2013  Documentation initiated by:  Vance Peper  Subjective/Objective Assessment:   62 yr old female s/p left total knee arthroplasty.     Action/Plan:   Patient is for shortterm rehab at [facility].Social worker is aware. Waiting for  SNF to accept.   Anticipated DC Date:  04/11/2013   Anticipated DC Plan:           Choice offered to / List presented to:             Status of service:  In process, will continue to follow Medicare Important Message given?   (If response is "NO", the following Medicare IM given date fields will be blank) Date Medicare IM given:   Date Additional Medicare IM given:    Discharge Disposition:    Per UR Regulation:    If discussed at Long Length of Stay Meetings, dates discussed:    Comments:

## 2013-04-11 ENCOUNTER — Ambulatory Visit: Payer: Medicaid Other | Admitting: Internal Medicine

## 2013-04-11 LAB — CBC
MCH: 31.5 pg (ref 26.0–34.0)
MCHC: 35.4 g/dL (ref 30.0–36.0)
MCV: 89.2 fL (ref 78.0–100.0)
Platelets: 146 10*3/uL — ABNORMAL LOW (ref 150–400)
RDW: 13.6 % (ref 11.5–15.5)

## 2013-04-11 NOTE — Progress Notes (Signed)
Physical medicine and rehabilitation consult requested and chart reviewed. Patient does not meet medical necessity after elective joint replacement for inpatient rehabilitation services. Recommendations have been made by therapies for skilled nursing facility. Will hold on formal rehabilitation consult at this time with recommendations made for skilled nursing facility

## 2013-04-11 NOTE — Progress Notes (Signed)
ANTICOAGULATION CONSULT NOTE - Follow Up Consult  Pharmacy Consult for coumadin Indication: VTE prophylaxis  Allergies  Allergen Reactions  . Phenobarbital Other (See Comments)    seizures    Patient Measurements: Height: 5\' 2"  (157.5 cm) Weight: 189 lb 9.5 oz (86 kg) IBW/kg (Calculated) : 50.1 Heparin Dosing Weight:   Vital Signs: Temp: 99.7 F (37.6 C) (04/25 0623) BP: 110/50 mmHg (04/25 0623) Pulse Rate: 85 (04/25 0623)  Labs:  Recent Labs  04/08/13 1117  04/08/13 1151 04/09/13 0859 04/10/13 0540 04/11/13 0500  HGB  --   < > 13.8 10.5* 10.2* 9.9*  HCT  --   < > 39.0 29.3* 28.4* 28.0*  PLT  --   < > 182 158 151 146*  APTT 29  --   --   --   --   --   LABPROT 13.3  --   --  14.0 17.0* 32.5*  INR 1.02  --   --  1.09 1.42 3.41*  CREATININE  --   --  0.62 0.59  --   --   < > = values in this interval not displayed.  Estimated Creatinine Clearance: 73.3 ml/min (by C-G formula based on Cr of 0.59).   Medications:  Scheduled:  . coumadin book   Does not apply Once  . darifenacin  15 mg Oral Daily  . docusate sodium  100 mg Oral BID  . famotidine  20 mg Oral QPM  . gabapentin  100 mg Oral TID  . methadone  20 mg Oral Q6H  . mupirocin cream   Topical BID  . pantoprazole  40 mg Oral Daily  . sodium chloride  10-40 mL Intracatheter Q12H  . [COMPLETED] warfarin  7.5 mg Oral ONCE-1800  . warfarin   Does not apply Once  . Warfarin - Pharmacist Dosing Inpatient   Does not apply q1800   Infusions:    Assessment: 62 yo female s/p TKA is currently on supratherapeutic coumadin after 2 doses of 7.5mg  coumadin x2.  INR is 3.41 Goal of Therapy:  INR 2-3   Plan:  1) No coumadin tonight 2) INR in am  Eadie Repetto, Tsz-Yin 04/11/2013,9:48 AM

## 2013-04-11 NOTE — Discharge Summary (Addendum)
Physician Discharge Summary  Patient ID: Gloria Patterson MRN: [id number] DOB/AGE: Dec 01, 1951 62 y.o.  Admit date: 04/08/2013 Discharge date: 4/29 2014  Admission Diagnoses:  Principal Problem:   Osteoarthritis of left knee   Discharge Diagnoses:  Same  Surgeries: Procedure(s): TOTAL KNEE ARTHROPLASTY on 04/08/2013 plate fixation of lateral condyle fracture   Consultants:    Discharged Condition: Stable  Hospital Course: Gloria Patterson is an 62 y.o. female who was admitted 04/08/2013 with a chief complaint of left knee pain, and found to have a diagnosis of Osteoarthritis of left knee.  They were brought to the operating room on 04/08/2013 and underwent the above named procedures.  Although she had interop distal lateral condyle fracture treated with plate fixation - this did not slow down her rehab. She progressed with wbat and was slowly increasing on cpm at time of discharge. Anticipate 2 week snf stay before dc home  Antibiotics given:  Anti-infectives   Start     Dose/Rate Route Frequency Ordered Stop   04/09/13 0024  ceFAZolin (ANCEF) IVPB 1 g/50 mL premix     1 g 100 mL/hr over 30 Minutes Intravenous Every 6 hours 04/09/13 0025 04/09/13 0711   04/08/13 0600  ceFAZolin (ANCEF) IVPB 2 g/50 mL premix     2 g 100 mL/hr over 30 Minutes Intravenous On call to O.R. 04/07/13 1442 04/08/13 1846    .  Recent vital signs:  Filed Vitals:   04/11/13 0623  BP: 110/50  Pulse: 85  Temp: 99.7 F (37.6 C)  Resp: 18    Recent laboratory studies:  Results for orders placed during the hospital encounter of 04/08/13  SURGICAL PCR SCREEN      Result Value Range   MRSA, PCR NEGATIVE  NEGATIVE   Staphylococcus aureus POSITIVE (*) NEGATIVE  URINE CULTURE      Result Value Range   Specimen Description URINE, CLEAN CATCH     Special Requests NONE     Culture  Setup Time 04/09/2013 08:43     Colony Count NO GROWTH     Culture NO GROWTH     Report Status 04/10/2013 FINAL     APTT      Result Value Range   aPTT 29  24 - 37 seconds  PROTIME-INR      Result Value Range   Prothrombin Time 13.3  11.6 - 15.2 seconds   INR 1.02  0.00 - 1.49  CBC      Result Value Range   WBC 7.9  4.0 - 10.5 K/uL   RBC 4.39  3.87 - 5.11 MIL/uL   Hemoglobin 13.8  12.0 - 15.0 g/dL   HCT 40.9  81.1 - 91.4 %   MCV 88.8  78.0 - 100.0 fL   MCH 31.4  26.0 - 34.0 pg   MCHC 35.4  30.0 - 36.0 g/dL   RDW 78.2  95.6 - 21.3 %   Platelets 182  150 - 400 K/uL  URINALYSIS, ROUTINE W REFLEX MICROSCOPIC      Result Value Range   Color, Urine YELLOW  YELLOW   APPearance CLEAR  CLEAR   Specific Gravity, Urine 1.026  1.005 - 1.030   pH 6.0  5.0 - 8.0   Glucose, UA NEGATIVE  NEGATIVE mg/dL   Hgb urine dipstick NEGATIVE  NEGATIVE   Bilirubin Urine NEGATIVE  NEGATIVE   Ketones, ur NEGATIVE  NEGATIVE mg/dL   Protein, ur NEGATIVE  NEGATIVE mg/dL   Urobilinogen, UA 1.0  0.0 -  1.0 mg/dL   Nitrite NEGATIVE  NEGATIVE   Leukocytes, UA SMALL (*) NEGATIVE  COMPREHENSIVE METABOLIC PANEL      Result Value Range   Sodium 141  135 - 145 mEq/L   Potassium 4.0  3.5 - 5.1 mEq/L   Chloride 104  96 - 112 mEq/L   CO2 24  19 - 32 mEq/L   Glucose, Bld 136 (*) 70 - 99 mg/dL   BUN 12  6 - 23 mg/dL   Creatinine, Ser 1.61  0.50 - 1.10 mg/dL   Calcium 9.7  8.4 - 09.6 mg/dL   Total Protein 7.8  6.0 - 8.3 g/dL   Albumin 3.3 (*) 3.5 - 5.2 g/dL   AST 73 (*) 0 - 37 U/L   ALT 50 (*) 0 - 35 U/L   Alkaline Phosphatase 100  39 - 117 U/L   Total Bilirubin 0.4  0.3 - 1.2 mg/dL   GFR calc non Af Amer >90  >90 mL/min   GFR calc Af Amer >90  >90 mL/min  URINE MICROSCOPIC-ADD ON      Result Value Range   WBC, UA 0-2  <3 WBC/hpf   RBC / HPF 0-2  <3 RBC/hpf  PROTIME-INR      Result Value Range   Prothrombin Time 14.0  11.6 - 15.2 seconds   INR 1.09  0.00 - 1.49  CBC      Result Value Range   WBC 10.8 (*) 4.0 - 10.5 K/uL   RBC 3.33 (*) 3.87 - 5.11 MIL/uL   Hemoglobin 10.5 (*) 12.0 - 15.0 g/dL   HCT 04.5 (*) 40.9  - 46.0 %   MCV 88.0  78.0 - 100.0 fL   MCH 31.5  26.0 - 34.0 pg   MCHC 35.8  30.0 - 36.0 g/dL   RDW 81.1  91.4 - 78.2 %   Platelets 158  150 - 400 K/uL  BASIC METABOLIC PANEL      Result Value Range   Sodium 135  135 - 145 mEq/L   Potassium 3.5  3.5 - 5.1 mEq/L   Chloride 97  96 - 112 mEq/L   CO2 30  19 - 32 mEq/L   Glucose, Bld 252 (*) 70 - 99 mg/dL   BUN 7  6 - 23 mg/dL   Creatinine, Ser 9.56  0.50 - 1.10 mg/dL   Calcium 8.3 (*) 8.4 - 10.5 mg/dL   GFR calc non Af Amer >90  >90 mL/min   GFR calc Af Amer >90  >90 mL/min  PROTIME-INR      Result Value Range   Prothrombin Time 17.0 (*) 11.6 - 15.2 seconds   INR 1.42  0.00 - 1.49  CBC      Result Value Range   WBC 12.1 (*) 4.0 - 10.5 K/uL   RBC 3.26 (*) 3.87 - 5.11 MIL/uL   Hemoglobin 10.2 (*) 12.0 - 15.0 g/dL   HCT 21.3 (*) 08.6 - 57.8 %   MCV 87.1  78.0 - 100.0 fL   MCH 31.3  26.0 - 34.0 pg   MCHC 35.9  30.0 - 36.0 g/dL   RDW 46.9  62.9 - 52.8 %   Platelets 151  150 - 400 K/uL  PROTIME-INR      Result Value Range   Prothrombin Time 32.5 (*) 11.6 - 15.2 seconds   INR 3.41 (*) 0.00 - 1.49  CBC      Result Value Range   WBC 14.6 (*) 4.0 - 10.5  K/uL   RBC 3.14 (*) 3.87 - 5.11 MIL/uL   Hemoglobin 9.9 (*) 12.0 - 15.0 g/dL   HCT 40.9 (*) 81.1 - 91.4 %   MCV 89.2  78.0 - 100.0 fL   MCH 31.5  26.0 - 34.0 pg   MCHC 35.4  30.0 - 36.0 g/dL   RDW 78.2  95.6 - 21.3 %   Platelets 146 (*) 150 - 400 K/uL  TYPE AND SCREEN      Result Value Range   ABO/RH(D) B POS     Antibody Screen NEG     Sample Expiration 04/11/2013    ABO/RH      Result Value Range   ABO/RH(D) B POS      Discharge Medications:     Medication List    STOP taking these medications       methadone 10 MG/ML solution  Commonly known as:  DOLOPHINE      TAKE these medications       DSS 100 MG Caps  Take 100 mg by mouth 2 (two) times daily.     famotidine 20 MG tablet  Commonly known as:  PEPCID  Take 20 mg by mouth every evening.     gabapentin  100 MG capsule  Commonly known as:  NEURONTIN  Take 100 mg by mouth 3 (three) times daily.     methadone 10 MG tablet  Commonly known as:  DOLOPHINE  Take 2 tablets (20 mg total) by mouth every 6 (six) hours.     methocarbamol 500 MG tablet  Commonly known as:  ROBAXIN  Take 1 tablet (500 mg total) by mouth every 6 (six) hours as needed.     omeprazole 20 MG capsule  Commonly known as:  PRILOSEC  Take 20 mg by mouth every morning.     oxyCODONE 5 MG immediate release tablet  Commonly known as:  Oxy IR/ROXICODONE  Take 1-3 tablets (5-15 mg total) by mouth every 3 (three) hours as needed.     PROVENTIL HFA 108 (90 BASE) MCG/ACT inhaler  Generic drug:  albuterol  Inhale 2 puffs into the lungs every 6 (six) hours as needed for shortness of breath.     solifenacin 10 MG tablet  Commonly known as:  VESICARE  Take 10 mg by mouth daily.     warfarin 5 MG tablet  Commonly known as:  COUMADIN  Take 1 tablet (5 mg total) by mouth daily.        Diagnostic Studies: Dg Chest Port 1 View  04/08/2013  *RADIOLOGY REPORT*  Clinical Data: Status post central line placement.  Rule out pneumothorax.  PORTABLE CHEST - 1 VIEW  Comparison: None.  Findings: A left internal jugular central line terminates at the high SVC.  The patient is minimally rotated left. No pneumothorax.  Mild to moderate right hemidiaphragm elevation. Midline trachea. Borderline cardiomegaly.     No pleural fluid.  Mild bibasilar subsegmental atelectasis or scarring.  IMPRESSION: Left internal jugular line terminating over the high SVC, without pneumothorax.   Original Report Authenticated By: Jeronimo Greaves, M.D.    Dg Femur Left Port  04/08/2013  *RADIOLOGY REPORT*  Clinical Data: Postop for internal fixation.  PORTABLE LEFT FEMUR - 2 VIEW  Comparison: MRI 09/11/2012  Findings: AP and lateral views of the proximal and distal femur. The proximal femur is poorly evaluated secondary to overlying soft tissues on the AP view.  Plate  and screw fixation of the distal femur.  Status post total  knee arthroplasty with long segment femoral component.  Minimally displaced fracture is identified about the distal lateral aspect of the femur on the AP view.  Not well imaged on the lateral.  IMPRESSION: Status post total knee arthroplasty and lateral plate and screw fixation of the distal femur.   Original Report Authenticated By: Jeronimo Greaves, M.D.    Dg C-arm 61-120 Min-no Report  04/08/2013  CLINICAL DATA: surgery   C-ARM 61-120 MINUTES  Fluoroscopy was utilized by the requesting physician.  No radiographic  interpretation.      Disposition: 01-Home or Self Care      Discharge Orders   Future Appointments Provider Department Dept Phone   04/11/2013 11:45 AM Nyoka Cowden, MD [facility] [phone]   Future Orders Complete By Expires     Call MD / Call 911  As directed     Comments:      If you experience chest pain or shortness of breath, CALL 911 and be transported to the hospital emergency room.  If you develope a fever above 101 F, pus (white drainage) or increased drainage or redness at the wound, or calf pain, call your surgeon's office.    Constipation Prevention  As directed     Comments:      Drink plenty of fluids.  Prune juice may be helpful.  You may use a stool softener, such as Colace (over the counter) 100 mg twice a day.  Use MiraLax (over the counter) for constipation as needed.    Diet - low sodium heart healthy  As directed     Discharge instructions  As directed     Comments:      1. Weight bearing as tolerated left leg 2. CPM 8 hours per day minimum 3. Keep incision dry    Increase activity slowly as tolerated  As directed           Signed: DEAN,GREGORY SCOTT 04/11/2013, 7:47 AM

## 2013-04-11 NOTE — Progress Notes (Signed)
Physical Therapy Treatment Patient Details Name: Gloria Patterson MRN: [id number] DOB: 1951/03/16 Today's Date: 04/11/2013 Time: 8756-4332 PT Time Calculation (min): 23 min  PT Assessment / Plan / Recommendation Comments on Treatment Session  Slow to progress due to onset of dizziness after taking a few steps.  Needs rehab at SNF.  Slow to progress with knee strength and ROM    Follow Up Recommendations  SNF     Does the patient have the potential to tolerate intense rehabilitation     Barriers to Discharge        Equipment Recommendations  None recommended by PT    Recommendations for Other Services    Frequency 7X/week   Plan Discharge plan remains appropriate    Precautions / Restrictions Restrictions Weight Bearing Restrictions: Yes LLE Weight Bearing: Weight bearing as tolerated   Pertinent Vitals/Pain See vitals tab    Mobility  Bed Mobility Bed Mobility: Supine to Sit;Sitting - Scoot to Edge of Bed Supine to Sit: With rails;HOB elevated;3: Mod assist Sitting - Scoot to Edge of Bed: 4: Min assist Details for Bed Mobility Assistance: increased time, assist for left LE Transfers Transfers: Sit to Stand;Stand to Sit Sit to Stand: 4: Min assist Stand to Sit: 1: +2 Total assist Stand to Sit: Patient Percentage: 60% Details for Transfer Assistance: cues for technique and hand placement Ambulation/Gait Ambulation/Gait Assistance: 1: +2 Total assist Ambulation/Gait: Patient Percentage: 70% Ambulation Distance (Feet): 3 Feet Assistive device: Rolling walker Ambulation/Gait Assistance Details: Pt. says she became dizzy after taking 3 steps and was assisted to recliner chair.  Needed much cueing for erect posture and technique Gait Pattern: Step-to pattern Gait velocity: decreased    Exercises Total Joint Exercises Ankle Circles/Pumps: AROM;Both;10 reps Quad Sets: AROM;Left;5 reps;Supine Short Arc Quad: AAROM;Left;5 reps;Supine Knee Flexion: AAROM;5  reps;Seated;Left Goniometric ROM: 0-45   PT Diagnosis:    PT Problem List:   PT Treatment Interventions:     PT Goals Acute Rehab PT Goals Pt will go Supine/Side to Sit: with modified independence PT Goal: Supine/Side to Sit - Progress: Progressing toward goal Pt will go Sit to Stand: with modified independence PT Goal: Sit to Stand - Progress: Progressing toward goal Pt will go Stand to Sit: with modified independence PT Goal: Stand to Sit - Progress: Progressing toward goal Pt will Ambulate: >150 feet;with modified independence;with rolling walker PT Goal: Ambulate - Progress: Progressing toward goal Pt will Perform Home Exercise Program: Independently PT Goal: Perform Home Exercise Program - Progress: Progressing toward goal  Visit Information  Last PT Received On: 04/11/13 Assistance Needed: +2    Subjective Data  Subjective: reports dizziness and need to sit down after taking 3 steps   Cognition  Cognition Arousal/Alertness: Awake/alert Behavior During Therapy: WFL for tasks assessed/performed Overall Cognitive Status: Within Functional Limits for tasks assessed    Balance     End of Session PT - End of Session Equipment Utilized During Treatment: Gait belt;Left knee immobilizer Activity Tolerance: Treatment limited secondary to medical complications (Comment) (dizziness) Patient left: in chair;with call bell/phone within reach Nurse Communication: Mobility status;Other (comment) (pt's dizziness)   GP     Ferman Hamming 04/11/2013, 1:49 PM Weldon Picking PT Acute Rehab Services [phone] Beeper [phone]

## 2013-04-11 NOTE — Progress Notes (Signed)
Subjective: Pt stable - pain controlled - cpm ok   Objective: Vital signs in last 24 hours: Temp:  [99.6 F (37.6 C)-101.3 F (38.5 C)] 99.7 F (37.6 C) (04/25 0623) Pulse Rate:  [85-115] 85 (04/25 0623) Resp:  [18] 18 (04/25 0623) BP: (110-133)/(50-75) 110/50 mmHg (04/25 0623) SpO2:  [95 %-100 %] 95 % (04/25 0623)  Intake/Output from previous day: 04/24 0701 - 04/25 0700 In: 480 [P.O.:480] Out: 200 [Urine:200] Intake/Output this shift:    Exam:  Neurovascular intact Sensation intact distally Intact pulses distally  Labs:  Recent Labs  04/08/13 1151 04/09/13 0859 04/10/13 0540 04/11/13 0500  HGB 13.8 10.5* 10.2* 9.9*    Recent Labs  04/10/13 0540 04/11/13 0500  WBC 12.1* 14.6*  RBC 3.26* 3.14*  HCT 28.4* 28.0*  PLT 151 146*    Recent Labs  04/08/13 1151 04/09/13 0859  NA 141 135  K 4.0 3.5  CL 104 97  CO2 24 30  BUN 12 7  CREATININE 0.62 0.59  GLUCOSE 136* 252*  CALCIUM 9.7 8.3*    Recent Labs  04/10/13 0540 04/11/13 0500  INR 1.42 3.41*    Assessment/Plan: Ready for dc to snf - inr elevating quicky - dc summary done - rx on chart   Gloria Patterson 04/11/2013, 7:43 AM

## 2013-04-12 LAB — PROTIME-INR
INR: 3.2 — ABNORMAL HIGH (ref 0.00–1.49)
Prothrombin Time: 31 seconds — ABNORMAL HIGH (ref 11.6–15.2)

## 2013-04-12 NOTE — Progress Notes (Signed)
OT Cancellation Note  Patient Details Name: Gloria Patterson MRN: [id number] DOB: 1951/08/09   Cancelled Treatment:    Reason Eval/Treat Not Completed:  Met with pt and educated in the scope of OT.  Pt with plans for ST rehab in SNF. Agreed that ADL is appropriately met in next venue of care.  Will defer OT eval to SNF.  Evern Bio 04/12/2013, 2:27 PM [phone]

## 2013-04-12 NOTE — Progress Notes (Signed)
Physical Therapy Treatment Patient Details Name: CRESCENT GOTHAM MRN: [id number] DOB: [birth year] Today's Date: 04/12/2013 Time: 4098-1191 PT Time Calculation (min): 31 min  PT Assessment / Plan / Recommendation Comments on Treatment Session  Pt pleasant & willing to participate.  Motivated to progress with mobility.   Pt tolerated increased distance & activity today; still with reports of dizziness with standing but BP WFL therefore cont'd to ambulate with recliner following close behind. (135/49 sitting, 133/59 standing, 124/56 sitting after ambulating)       Follow Up Recommendations  SNF     Does the patient have the potential to tolerate intense rehabilitation     Barriers to Discharge        Equipment Recommendations  None recommended by PT    Recommendations for Other Services    Frequency 7X/week   Plan Discharge plan remains appropriate    Precautions / Restrictions Restrictions LLE Weight Bearing: Weight bearing as tolerated       Mobility  Bed Mobility Bed Mobility: Not assessed Transfers Transfers: Sit to Stand;Stand to Sit Sit to Stand: 4: Min assist;With upper extremity assist;With armrests;From chair/3-in-1 Stand to Sit: 4: Min assist;With upper extremity assist;With armrests;To chair/3-in-1 Details for Transfer Assistance: Cues for hand placement & L LE positioning.  Ambulation/Gait Ambulation/Gait Assistance: 4: Min guard (+2 for safety to follow with recliner) Ambulation Distance (Feet): 30 Feet (15' + 15' ) Assistive device: Rolling walker Ambulation/Gait Assistance Details: c/o dizziness again today but vitals WFL.  Encouraged increased distance with recliner following behind her for safety.  After ~15' pt requested seated rest break but then able to ambulate again.  2nd trial of ambulation pt denies dizziness & only c/o fatigue.  Cues for sequencing, tall posture, & body positioning inside RW.   Gait Pattern: Step-to pattern;Decreased step length -  left;Decreased step length - right;Decreased stance time - left;Decreased weight shift to left;Trunk flexed Gait velocity: decreased General Gait Details: c/o dizziness with 1st 94' but denies dizziness after seated rest break & ambulating another 15'.    Stairs: No Wheelchair Mobility Wheelchair Mobility: No    Exercises Total Joint Exercises Ankle Circles/Pumps: AROM;Both;10 reps Quad Sets: AROM;Both;10 reps Heel Slides: AAROM;Left;10 reps Straight Leg Raises: AAROM;Left;10 reps    PT Goals Acute Rehab PT Goals Time For Goal Achievement: 04/23/13 Potential to Achieve Goals: Good Pt will go Supine/Side to Sit: with modified independence Pt will go Sit to Supine/Side: with modified independence Pt will go Sit to Stand: with modified independence PT Goal: Sit to Stand - Progress: Progressing toward goal Pt will go Stand to Sit: with modified independence PT Goal: Stand to Sit - Progress: Progressing toward goal Pt will Ambulate: >150 feet;with modified independence;with rolling walker PT Goal: Ambulate - Progress: Progressing toward goal Pt will Go Up / Down Stairs: 1-2 stairs;with min assist;with rolling walker Pt will Perform Home Exercise Program: Independently PT Goal: Perform Home Exercise Program - Progress: Progressing toward goal  Visit Information  Last PT Received On: 04/12/13 Assistance Needed: +2 (safety)    Subjective Data      Cognition  Cognition Arousal/Alertness: Awake/alert Behavior During Therapy: WFL for tasks assessed/performed Overall Cognitive Status: Within Functional Limits for tasks assessed    Balance     End of Session PT - End of Session Equipment Utilized During Treatment: Gait belt;Left knee immobilizer Activity Tolerance: Patient tolerated treatment well Patient left: in chair;with call bell/phone within reach Nurse Communication: Mobility status CPM Left Knee CPM Left Knee: On  Verdell Face, [place] [phone] 04/12/2013

## 2013-04-12 NOTE — Progress Notes (Signed)
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  Rec to do coumadin 2.5mg  po qday starting on 04/27 and recheck INR by skill nursing facility on Monday to reassess dosing 2) Plan discharge today

## 2013-04-12 NOTE — Progress Notes (Signed)
Patient ID: Gloria Patterson, female   DOB: 11/03/1951, 62 y.o.   MRN: [id number] PATIENT ID: Gloria Patterson        MRN:  [id number]          DOB/AGE: Dec 22, 1950 / 62 y.o.    Norlene Campbell, MD   Jacqualine Code, PA-C [address] [place], [place]  [address]                             [phone]   PROGRESS NOTE  Subjective:  negative for Chest Pain  negative for Shortness of Breath  negative for Nausea/Vomiting   negative for Calf Pain    Tolerating Diet: yes         Patient reports pain as mild.     Comfortable, awaiting SNF placement Mon  Objective: Vital signs in last 24 hours:   Patient Vitals for the past 24 hrs:  BP Temp Temp src Pulse Resp SpO2  04/12/13 0837 - - - - 16 96 %  04/12/13 0627 126/85 mmHg 98.2 F (36.8 C) - 98 18 100 %  04/11/13 2100 118/67 mmHg 98.7 F (37.1 C) - 101 18 100 %  04/11/13 1600 - - - - 18 -  04/11/13 1333 119/58 mmHg 99.4 F (37.4 C) Oral 98 18 95 %  04/11/13 1200 - - - - 18 -      Intake/Output from previous day:   04/25 0701 - 04/26 0700 In: 240 [P.O.:240] Out: -    Intake/Output this shift:   04/26 0701 - 04/26 1900 In: 20 [I.V.:20] Out: -    Intake/Output     04/25 0701 - 04/26 0700 04/26 0701 - 04/27 0700   P.O. 240    I.V. (mL/kg)  20 (0.2)   Total Intake(mL/kg) 240 (2.8) 20 (0.2)   Urine (mL/kg/hr)     Total Output       Net +240 +20        Urine Occurrence  1 x      LABORATORY DATA:  Recent Labs  04/08/13 1151 04/09/13 0859 04/10/13 0540 04/11/13 0500  WBC 7.9 10.8* 12.1* 14.6*  HGB 13.8 10.5* 10.2* 9.9*  HCT 39.0 29.3* 28.4* 28.0*  PLT 182 158 151 146*    Recent Labs  04/08/13 1151 04/09/13 0859  NA 141 135  K 4.0 3.5  CL 104 97  CO2 24 30  BUN 12 7  CREATININE 0.62 0.59  GLUCOSE 136* 252*  CALCIUM 9.7 8.3*   Lab Results  Component Value Date   INR 3.20* 04/12/2013   INR 3.41* 04/11/2013   INR 1.42 04/10/2013    Recent Radiographic Studies :  Dg Chest Port 1  View  04/08/2013  *RADIOLOGY REPORT*  Clinical Data: Status post central line placement.  Rule out pneumothorax.  PORTABLE CHEST - 1 VIEW  Comparison: None.  Findings: A left internal jugular central line terminates at the high SVC.  The patient is minimally rotated left. No pneumothorax.  Mild to moderate right hemidiaphragm elevation. Midline trachea. Borderline cardiomegaly.     No pleural fluid.  Mild bibasilar subsegmental atelectasis or scarring.  IMPRESSION: Left internal jugular line terminating over the high SVC, without pneumothorax.   Original Report Authenticated By: Jeronimo Greaves, M.D.    Dg Femur Left Port  04/08/2013  *RADIOLOGY REPORT*  Clinical Data: Postop for internal fixation.  PORTABLE LEFT FEMUR - 2 VIEW  Comparison: MRI 09/11/2012  Findings:  AP and lateral views of the proximal and distal femur. The proximal femur is poorly evaluated secondary to overlying soft tissues on the AP view.  Plate and screw fixation of the distal femur.  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  Minimally displaced fracture is identified about the distal lateral aspect of the femur on the AP view.  Not well imaged on the lateral.  IMPRESSION: Status post total knee arthroplasty and lateral plate and screw fixation of the distal femur.   Original Report Authenticated By: Jeronimo Greaves, M.D.    Dg C-arm 61-120 Min-no Report  04/08/2013  CLINICAL DATA: surgery   C-ARM 61-120 MINUTES  Fluoroscopy was utilized by the requesting physician.  No radiographic  interpretation.       Examination:  General appearance: alert, cooperative and no distress  Wound Exam: clean, dry, intact   Drainage:  None: wound tissue dry  Motor Exam: EHL, FHL, Anterior Tibial and Posterior Tibial Intact  Sensory Exam: Superficial Peroneal, Deep Peroneal and Tibial normal  Vascular Exam: Normal  Assessment:    4 Days Post-Op  Procedure(s) (LRB): TOTAL KNEE ARTHROPLASTY (Left)  ADDITIONAL  DIAGNOSIS:  Principal Problem:   Osteoarthritis of left knee      DVT Prophylaxis:  Coumadin  DISCHARGE PLAN: Skilled Nursing Facility/Rehab  DISCHARGE NEEDS: Walker   awaiting SNF placement. WBC elevated without temp-will repeat    Valeria Batman 04/12/2013 10:58 AM

## 2013-04-13 LAB — CBC WITH DIFFERENTIAL/PLATELET
Basophils Absolute: 0 10*3/uL (ref 0.0–0.1)
Eosinophils Absolute: 0.5 10*3/uL (ref 0.0–0.7)
Eosinophils Relative: 5 % (ref 0–5)
Lymphocytes Relative: 39 % (ref 12–46)
Lymphs Abs: 4 10*3/uL (ref 0.7–4.0)
MCH: 31.2 pg (ref 26.0–34.0)
MCV: 89.9 fL (ref 78.0–100.0)
Neutrophils Relative %: 47 % (ref 43–77)
Platelets: 203 10*3/uL (ref 150–400)
RBC: 2.98 MIL/uL — ABNORMAL LOW (ref 3.87–5.11)
RDW: 13.8 % (ref 11.5–15.5)
WBC: 10.3 10*3/uL (ref 4.0–10.5)

## 2013-04-13 LAB — GLUCOSE, CAPILLARY: Glucose-Capillary: 116 mg/dL — ABNORMAL HIGH (ref 70–99)

## 2013-04-13 LAB — PROTIME-INR: Prothrombin Time: 25.2 seconds — ABNORMAL HIGH (ref 11.6–15.2)

## 2013-04-13 LAB — HEMOGLOBIN A1C
Hgb A1c MFr Bld: 8.3 % — ABNORMAL HIGH (ref <5.7)
Mean Plasma Glucose: 192 mg/dL — ABNORMAL HIGH (ref <117)

## 2013-04-13 MED ORDER — FLEET ENEMA 7-19 GM/118ML RE ENEM
1.0000 | ENEMA | Freq: Every day | RECTAL | Status: DC | PRN
  Administered 2013-04-15: 1 via RECTAL
  Filled 2013-04-13: qty 1

## 2013-04-13 MED ORDER — MAGNESIUM HYDROXIDE 400 MG/5ML PO SUSP
30.0000 mL | Freq: Every day | ORAL | Status: DC | PRN
  Administered 2013-04-13 – 2013-04-14 (×2): 30 mL via ORAL
  Filled 2013-04-13 (×2): qty 30

## 2013-04-13 MED ORDER — WARFARIN SODIUM 2.5 MG PO TABS
2.5000 mg | ORAL_TABLET | Freq: Once | ORAL | Status: AC
  Administered 2013-04-13: 2.5 mg via ORAL
  Filled 2013-04-13: qty 1

## 2013-04-13 MED ORDER — INSULIN ASPART 100 UNIT/ML ~~LOC~~ SOLN
0.0000 [IU] | Freq: Every day | SUBCUTANEOUS | Status: DC
Start: 2013-04-13 — End: 2013-04-16

## 2013-04-13 MED ORDER — WHITE PETROLATUM GEL
Status: AC
  Administered 2013-04-13: 0.2
  Filled 2013-04-13: qty 5

## 2013-04-13 MED ORDER — INSULIN ASPART 100 UNIT/ML ~~LOC~~ SOLN
0.0000 [IU] | Freq: Three times a day (TID) | SUBCUTANEOUS | Status: DC
  Administered 2013-04-13 – 2013-04-14 (×2): 2 [IU] via SUBCUTANEOUS
  Administered 2013-04-15: 3 [IU] via SUBCUTANEOUS

## 2013-04-13 NOTE — Progress Notes (Signed)
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 Awaiting placement  INR goal 2-3  Plan: 1) Coumadin 2.5mg  po x1 tonight.  Rec to do coumadin 2.5mg  po qday starting on 04/28 and recheck INR by skill nursing facility on [place] to reassess dosing if discharge today

## 2013-04-13 NOTE — Progress Notes (Signed)
Physical Therapy Treatment Patient Details Name: Gloria Patterson MRN: [id number] DOB: 03/29/1951 Today's Date: 04/13/2013 Time: 7829-5621 PT Time Calculation (min): 23 min  PT Assessment / Plan / Recommendation Comments on Treatment Session  Pt cont's to make steady progress with mobility but still feel pt would strongly benefit from ST-SNF to maximize functional recovery unless she has 24 hr (A)/(S)    Follow Up Recommendations  SNF     Does the patient have the potential to tolerate intense rehabilitation     Barriers to Discharge        Equipment Recommendations  None recommended by PT    Recommendations for Other Services    Frequency 7X/week   Plan Discharge plan remains appropriate    Precautions / Restrictions Precautions Precautions: Fall Required Braces or Orthoses: Knee Immobilizer - Left Restrictions LLE Weight Bearing: Weight bearing as tolerated       Mobility  Bed Mobility Bed Mobility: Supine to Sit;Sitting - Scoot to Edge of Bed Supine to Sit: 5: Supervision;HOB flat Details for Bed Mobility Assistance: Increased time but was able to perform without any physical (A).  Pt used hooking technique to manage LLE.   Transfers Transfers: Sit to Stand;Stand to Sit Sit to Stand: 4: Min assist;With upper extremity assist;With armrests;From bed;From chair/3-in-1 Stand to Sit: 4: Min assist;With upper extremity assist;With armrests;To chair/3-in-1 Details for Transfer Assistance: (A) to achieve standing & control descent.   Ambulation/Gait Ambulation/Gait Assistance: 4: Min guard Ambulation Distance (Feet): 60 Feet Assistive device: Rolling walker Ambulation/Gait Assistance Details: Cues for tall posture, & encouragement to increase WBing through L LE during Rt swing phase.   Gait Pattern: Step-to pattern;Decreased step length - right;Decreased step length - left;Decreased stance time - left;Decreased weight shift to left Stairs: No Wheelchair  Mobility Wheelchair Mobility: No    Exercises Total Joint Exercises Ankle Circles/Pumps: AROM;Both;20 reps Quad Sets: AROM;Both;15 reps Heel Slides: AAROM;Left;5 reps Straight Leg Raises: AAROM;Left;10 reps Knee Flexion: AAROM;Left;5 reps;Seated    PT Goals Acute Rehab PT Goals Time For Goal Achievement: 04/23/13 Potential to Achieve Goals: Good Pt will go Supine/Side to Sit: with modified independence PT Goal: Supine/Side to Sit - Progress: Progressing toward goal Pt will go Sit to Supine/Side: with modified independence Pt will go Sit to Stand: with modified independence PT Goal: Sit to Stand - Progress: Progressing toward goal Pt will go Stand to Sit: with modified independence PT Goal: Stand to Sit - Progress: Progressing toward goal Pt will Ambulate: >150 feet;with modified independence;with rolling walker PT Goal: Ambulate - Progress: Progressing toward goal Pt will Go Up / Down Stairs: 1-2 stairs;with min assist;with rolling walker Pt will Perform Home Exercise Program: Independently PT Goal: Perform Home Exercise Program - Progress: Progressing toward goal  Visit Information  Last PT Received On: 04/13/13 Assistance Needed: +1    Subjective Data      Cognition  Cognition Arousal/Alertness: Awake/alert Behavior During Therapy: WFL for tasks assessed/performed Overall Cognitive Status: Within Functional Limits for tasks assessed    Balance     End of Session PT - End of Session Equipment Utilized During Treatment: Gait belt;Left knee immobilizer Activity Tolerance: Patient tolerated treatment well Patient left: in chair;with call bell/phone within reach Nurse Communication: Mobility status     Verdell Face, [place] [phone] 04/13/2013

## 2013-04-13 NOTE — Progress Notes (Signed)
Patient ID: Gloria Patterson, female   DOB: [birth year], 62 y.o.   MRN: [id number] PATIENT ID: Gloria Patterson        MRN:  [id number]          DOB/AGE: 09/24/1951 / 62 y.o.    Norlene Campbell, MD   Jacqualine Code, PA-C [address] [place], [place]  [address]                             [phone]   PROGRESS NOTE  Subjective:  negative for Chest Pain  negative for Shortness of Breath  negative for Nausea/Vomiting   negative for Calf Pain    Tolerating Diet: yes         Patient reports pain as mild.     Minimal complaints  Objective: Vital signs in last 24 hours:   Patient Vitals for the past 24 hrs:  BP Temp Temp src Pulse Resp SpO2  04/13/13 0757 - - - - 20 97 %  04/13/13 0623 114/52 mmHg 98.9 F (37.2 C) - 78 16 96 %  04/12/13 2147 108/48 mmHg 100.1 F (37.8 C) - 86 16 96 %  04/12/13 1638 - - - - 16 95 %  04/12/13 1400 121/56 mmHg 98.7 F (37.1 C) Oral 87 20 97 %  04/12/13 1157 - - - - 20 95 %      Intake/Output from previous day:   04/26 0701 - 04/27 0700 In: 1060 [P.O.:1040; I.V.:20] Out: 0    Intake/Output this shift:       Intake/Output     04/26 0701 - 04/27 0700 04/27 0701 - 04/28 0700   P.O. 1040    I.V. (mL/kg) 20 (0.2)    Total Intake(mL/kg) 1060 (12.3)    Total Output 0     Net +1060          Urine Occurrence 4 x       LABORATORY DATA:  Recent Labs  27-Apr-2013 1151 04/09/13 0859 04/10/13 0540 04/11/13 0500 04/13/13 0630  WBC 7.9 10.8* 12.1* 14.6* 10.3  HGB 13.8 10.5* 10.2* 9.9* 9.3*  HCT 39.0 29.3* 28.4* 28.0* 26.8*  PLT 182 158 151 146* 203    Recent Labs  Apr 27, 2013 1151 04/09/13 0859  NA 141 135  K 4.0 3.5  CL 104 97  CO2 24 30  BUN 12 7  CREATININE 0.62 0.59  GLUCOSE 136* 252*  CALCIUM 9.7 8.3*   Lab Results  Component Value Date   INR 2.42* 04/13/2013   INR 3.20* 04/12/2013   INR 3.41* 04/11/2013    Recent Radiographic Studies :  Dg Chest Port 1 View  04/27/13  *RADIOLOGY REPORT*  Clinical Data:  Status post central line placement.  Rule out pneumothorax.  PORTABLE CHEST - 1 VIEW  Comparison: None.  Findings: A left internal jugular central line terminates at the high SVC.  The patient is minimally rotated left. No pneumothorax.  Mild to moderate right hemidiaphragm elevation. Midline trachea. Borderline cardiomegaly.     No pleural fluid.  Mild bibasilar subsegmental atelectasis or scarring.  IMPRESSION: Left internal jugular line terminating over the high SVC, without pneumothorax.   Original Report Authenticated By: Jeronimo Greaves, M.D.    Dg Femur Left Port  Apr 27, 2013  *RADIOLOGY REPORT*  Clinical Data: Postop for internal fixation.  PORTABLE LEFT FEMUR - 2 VIEW  Comparison: MRI 09/11/2012  Findings: AP and lateral views of the proximal and distal femur.  The proximal femur is poorly evaluated secondary to overlying soft tissues on the AP view.  Plate and screw fixation of the distal femur.  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  Minimally displaced fracture is identified about the distal lateral aspect of the femur on the AP view.  Not well imaged on the lateral.  IMPRESSION: Status post total knee arthroplasty and lateral plate and screw fixation of the distal femur.   Original Report Authenticated By: Jeronimo Greaves, M.D.    Dg C-arm 61-120 Min-no Report  04/08/2013  CLINICAL DATA: surgery   C-ARM 61-120 MINUTES  Fluoroscopy was utilized by the requesting physician.  No radiographic  interpretation.       Examination:  General appearance: alert, cooperative and mild distress  Wound Exam: clean, dry, intact   Drainage:  None: wound tissue dry  Motor Exam: EHL, FHL, Anterior Tibial and Posterior Tibial Intact  Sensory Exam: Superficial Peroneal, Deep Peroneal and Tibial normal  Vascular Exam: Left posterior tibial artery has trace pulse  Assessment:    5 Days Post-Op  Procedure(s) (LRB): TOTAL KNEE ARTHROPLASTY (Left)  ADDITIONAL DIAGNOSIS:    Principal Problem:   Osteoarthritis of left knee  Acute Blood Loss Anemia asymptomatic and Hyperglycemia   Plan: Physical Therapy as ordered Weight Bearing as Tolerated (WBAT)  DVT Prophylaxis:  Coumadin  DISCHARGE PLAN: Skilled Nursing Facility/Rehab  DISCHARGE NEEDS: HHPT, Walker and 3-in-1 comode seat  Awaiting SNF         [facility] 04/13/2013 9:56 AM

## 2013-04-14 LAB — GLUCOSE, CAPILLARY
Glucose-Capillary: 103 mg/dL — ABNORMAL HIGH (ref 70–99)
Glucose-Capillary: 123 mg/dL — ABNORMAL HIGH (ref 70–99)

## 2013-04-14 MED ORDER — WARFARIN SODIUM 5 MG PO TABS
5.0000 mg | ORAL_TABLET | Freq: Once | ORAL | Status: AC
  Administered 2013-04-14: 5 mg via ORAL
  Filled 2013-04-14: qty 1

## 2013-04-14 NOTE — Progress Notes (Signed)
Subjective: Pt stable - mobilizing well -    Objective: Vital signs in last 24 hours: Temp:  [97.9 F (36.6 C)-99.8 F (37.7 C)] 97.9 F (36.6 C) (04/28 1547) Pulse Rate:  [81-97] 88 (04/28 1547) Resp:  [18] 18 (04/28 1547) BP: (106-122)/(51-56) 116/56 mmHg (04/28 1547) SpO2:  [96 %-98 %] 96 % (04/28 1547)  Intake/Output from previous day: 04/27 0701 - 04/28 0700 In: 1320 [P.O.:1320] Out: 200 [Urine:200] Intake/Output this shift: Total I/O In: 240 [P.O.:240] Out: -   Exam:  Neurovascular intact Sensation intact distally Intact pulses distally  Labs:  Recent Labs  04/13/13 0630  HGB 9.3*    Recent Labs  04/13/13 0630  WBC 10.3  RBC 2.98*  HCT 26.8*  PLT 203   No results found for this basename: NA, K, CL, CO2, BUN, CREATININE, GLUCOSE, CALCIUM,  in the last 72 hours  Recent Labs  04/13/13 0630 04/14/13 0615  INR 2.42* 2.11*    Assessment/Plan: inr ok - dc to snf am - all paperwork done   [facility] SCOTT 04/14/2013, 5:26 PM

## 2013-04-14 NOTE — Progress Notes (Signed)
Physical Therapy Treatment Patient Details Name: Gloria Patterson MRN: [id number] DOB: 04/09/1951 Today's Date: 04/14/2013 Time: 4098-1191 PT Time Calculation (min): 31 min  PT Assessment / Plan / Recommendation Comments on Treatment Session  Reviewed stairs with patient.  Patient refused to practice.  Short ambulation b/c patient only wanted to use the commode.  Refused to increase ambulation.  Patient asked about exercises handout.  Will practice at home.    Follow Up Recommendations  SNF     Does the patient have the potential to tolerate intense rehabilitation     Barriers to Discharge        Equipment Recommendations  None recommended by PT    Recommendations for Other Services    Frequency 7X/week   Plan Discharge plan remains appropriate    Precautions / Restrictions Precautions Precautions: Fall Restrictions Weight Bearing Restrictions: Yes LLE Weight Bearing: Weight bearing as tolerated   Pertinent Vitals/Pain 7/10 L Knee Pain.    Mobility  Bed Mobility Bed Mobility: Supine to Sit;Sitting - Scoot to Edge of Bed Supine to Sit: 5: Supervision;HOB elevated Sitting - Scoot to Edge of Bed: 4: Min guard Details for Bed Mobility Assistance: Increased time but was able to perform without any physical (A).  Pt used hooking technique to manage LLE.   Transfers Sit to Stand: With upper extremity assist;With armrests;From bed;4: Min guard;From toilet Stand to Sit: With upper extremity assist;With armrests;To chair/3-in-1;4: Min guard;To toilet Details for Transfer Assistance: Min guard as patient is still slightly unsteady. Ambulation/Gait Ambulation/Gait Assistance: 4: Min guard Ambulation Distance (Feet): 30 Feet Assistive device: Rolling walker Ambulation/Gait Assistance Details: Patient soley ambulated b/c she needed to use the commode.  Went over the basics of stair training but patient refused to practice. Gait Pattern: Step-to pattern;Decreased step length -  right;Decreased step length - left;Decreased stance time - left;Decreased weight shift to left Gait velocity: decreased    Exercises     PT Diagnosis:    PT Problem List:   PT Treatment Interventions:     PT Goals Acute Rehab PT Goals PT Goal: Supine/Side to Sit - Progress: Progressing toward goal PT Goal: Sit to Stand - Progress: Progressing toward goal PT Goal: Stand to Sit - Progress: Progressing toward goal PT Goal: Ambulate - Progress: Progressing toward goal PT Goal: Perform Home Exercise Program - Progress: Progressing toward goal  Visit Information  Last PT Received On: 04/14/13 Assistance Needed: +1    Subjective Data      Cognition  Cognition Arousal/Alertness: Awake/alert Behavior During Therapy: WFL for tasks assessed/performed Overall Cognitive Status: Within Functional Limits for tasks assessed    Balance     End of Session PT - End of Session Equipment Utilized During Treatment: Gait belt Activity Tolerance: Patient tolerated treatment well Patient left: in chair;with call bell/phone within reach   GP     [facility] Yadira Hada JEAN SPTA 04/14/2013, 3:02 PM

## 2013-04-14 NOTE — Care Management Note (Signed)
CARE MANAGEMENT NOTE 04/14/2013  Patient:  Gloria Patterson, Gloria Patterson   Account Number:  [id number]  Date Initiated:  04/10/2013  Documentation initiated by:  Vance Peper  Subjective/Objective Assessment:   62 yr old female s/p left total knee arthroplasty.     Action/Plan:   Patient is for shortterm rehab at [facility].Social worker is aware. Waiting for  SNF to accept.   Anticipated DC Date:  04/15/2013   Anticipated DC Plan:  SKILLED NURSING FACILITY  In-house referral  Clinical Social Worker      DC Planning Services  CM consult      Choice offered to / List presented to:             Status of service:  Completed, signed off Medicare Important Message given?   (If response is "NO", the following Medicare IM given date fields will be blank) Date Medicare IM given:   Date Additional Medicare IM given:    Discharge Disposition:  SKILLED NURSING FACILITY  Per UR Regulation:    If discussed at Long Length of Stay Meetings, dates discussed:    Comments:  04/14/13  3:30pm Vance Peper, RN BSN NCM Patient will discharge to [facility].

## 2013-04-14 NOTE — Progress Notes (Signed)
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  No bleeding reported.  Awaiting placement for ST SNF.   INR goal 2-3  Plan: 1) Coumadin 5 mg po x1 tonight.  Rec to do coumadin 2.5mg  po TTSS and 5 mg MWF if discharged to skilled nursing facility today  Herby Abraham, Pharm.D. [phone] 04/14/2013 10:52 AM

## 2013-04-14 NOTE — Progress Notes (Signed)
Seen and agreed 04/14/2013 Mousa Prout Elizabeth PTA [phone] pager [phone] office    

## 2013-04-14 NOTE — Progress Notes (Signed)
Seen and agreed 04/14/2013 Stormee Duda Elizabeth PTA [phone] pager [phone] office    

## 2013-04-14 NOTE — Progress Notes (Signed)
Physical Therapy Treatment Patient Details Name: Gloria Patterson MRN: [id number] DOB: [birth year] Today's Date: 04/14/2013 Time: 7829-5621 PT Time Calculation (min): 30 min  PT Assessment / Plan / Recommendation Comments on Treatment Session  Pt remained steady with mobility.    Follow Up Recommendations  SNF     Does the patient have the potential to tolerate intense rehabilitation     Barriers to Discharge        Equipment Recommendations  None recommended by PT    Recommendations for Other Services    Frequency 7X/week   Plan Discharge plan remains appropriate    Precautions / Restrictions Precautions Precautions: Fall Required Braces or Orthoses: Knee Immobilizer - Left Knee Immobilizer - Left: Other (comment) Restrictions Weight Bearing Restrictions: Yes LLE Weight Bearing: Weight bearing as tolerated   Pertinent Vitals/Pain 9/10 L Knee Pain    Mobility  Bed Mobility Bed Mobility: Supine to Sit;Sitting - Scoot to Edge of Bed Supine to Sit: 5: Supervision;HOB elevated Details for Bed Mobility Assistance: Increased time but was able to perform without any physical (A).  Pt used hooking technique to manage LLE.   Transfers Transfers: Sit to Stand;Stand to Sit Sit to Stand: With upper extremity assist;With armrests;From bed;4: Min guard Stand to Sit: With upper extremity assist;With armrests;To chair/3-in-1;4: Min guard Details for Transfer Assistance: Min guard as patient is still slightly unsteady. Ambulation/Gait Ambulation/Gait Assistance: 4: Min guard Ambulation Distance (Feet): 70 Feet Assistive device: Rolling walker Ambulation/Gait Assistance Details: Cues for posture and encouargement to increase weight bearing.  Walk turned into a shuffle. C/O dizziness but drank water and was fine. Gait Pattern: Step-to pattern;Decreased step length - right;Decreased step length - left;Decreased stance time - left;Decreased weight shift to left Gait velocity:  decreased Stairs: No    Exercises Total Joint Exercises Quad Sets: AROM;Both;10 reps Heel Slides: AAROM;Left;10 reps Straight Leg Raises: AAROM;Left;10 reps Long Arc Quad: AAROM;5 reps;Seated   PT Diagnosis:    PT Problem List:   PT Treatment Interventions:     PT Goals Acute Rehab PT Goals PT Goal: Supine/Side to Sit - Progress: Progressing toward goal PT Goal: Sit to Stand - Progress: Progressing toward goal PT Goal: Stand to Sit - Progress: Progressing toward goal PT Goal: Ambulate - Progress: Progressing toward goal PT Goal: Perform Home Exercise Program - Progress: Progressing toward goal  Visit Information  Last PT Received On: 04/14/13 Assistance Needed: +1    Subjective Data      Cognition  Cognition Arousal/Alertness: Awake/alert Behavior During Therapy: WFL for tasks assessed/performed Overall Cognitive Status: Within Functional Limits for tasks assessed    Balance     End of Session PT - End of Session Equipment Utilized During Treatment: Gait belt;Left knee immobilizer Activity Tolerance: Patient tolerated treatment well Patient left: in chair;with call bell/phone within reach   GP     [facility], Hannie Shoe JEAN SPTA 04/14/2013, 9:06 AM

## 2013-04-15 LAB — GLUCOSE, CAPILLARY
Glucose-Capillary: 104 mg/dL — ABNORMAL HIGH (ref 70–99)
Glucose-Capillary: 163 mg/dL — ABNORMAL HIGH (ref 70–99)
Glucose-Capillary: 70 mg/dL (ref 70–99)

## 2013-04-15 MED ORDER — WARFARIN SODIUM 5 MG PO TABS
5.0000 mg | ORAL_TABLET | Freq: Every day | ORAL | Status: DC
  Administered 2013-04-15: 5 mg via ORAL
  Filled 2013-04-15 (×2): qty 1

## 2013-04-15 NOTE — Progress Notes (Signed)
Seen and agreed 04/15/2013 Bosten Newstrom Elizabeth PTA [phone] pager [phone] office    

## 2013-04-15 NOTE — Progress Notes (Signed)
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  No bleeding reported.  [facility]waiting [facility].   INR goal 2-3  Plan: 1) Coumadin 5 mg po daily  Herby Abraham, Pharm.D. [phone] 04/15/2013 10:15 AM

## 2013-04-15 NOTE — Progress Notes (Signed)
Subjective: 7 Days Post-Op Procedure(s) (LRB): TOTAL KNEE ARTHROPLASTY (Left) Patient reports pain as mild. Pt very comfortable and tolerated PT today.   Ready for NHP in am   Objective: Vital signs in last 24 hours: Temp:  [99 F (37.2 C)-100.4 F (38 C)] 99 F (37.2 C) (04/29 0635) Pulse Rate:  [75] 75 (04/29 0635) Resp:  [16-18] 16 (04/29 0635) BP: (125-130)/(60) 130/60 mmHg (04/29 0635) SpO2:  [95 %-96 %] 96 % (04/29 0635)  Intake/Output from previous day: 04/28 0701 - 04/29 0700 In: 480 [P.O.:480] Out: -  Intake/Output this shift: Total I/O In: 720 [P.O.:720] Out: -    Recent Labs  04/13/13 0630  HGB 9.3*    Recent Labs  04/13/13 0630  WBC 10.3  RBC 2.98*  HCT 26.8*  PLT 203   No results found for this basename: NA, K, CL, CO2, BUN, CREATININE, GLUCOSE, CALCIUM,  in the last 72 hours  Recent Labs  04/14/13 0615 04/15/13 0500  INR 2.11* 2.16*    Neurovascular intact Sensation intact distally Dorsiflexion/Plantar flexion intact Incision: dressing C/D/I  Assessment/Plan: 7 Days Post-Op Procedure(s) (LRB): TOTAL KNEE ARTHROPLASTY (Left) Discharge to SNF in am Dr August Saucer has completed paper work and pt is ready for discharge in am.  Gloria Patterson M 04/15/2013, 3:54 PM

## 2013-04-15 NOTE — Progress Notes (Signed)
Physical Therapy Treatment Patient Details Name: Gloria Patterson MRN: [id number] DOB: [birth year] Today's Date: 04/15/2013 Time: 9147-8295 PT Time Calculation (min): 39 min  PT Assessment / Plan / Recommendation Comments on Treatment Session  Didn't practice stairs as patient D/C to SNF.  Patient pushed herself to participate in therapy.  Patient was motivated and dizzy x1.      Follow Up Recommendations  SNF     Does the patient have the potential to tolerate intense rehabilitation     Barriers to Discharge        Equipment Recommendations  None recommended by PT    Recommendations for Other Services    Frequency     Plan Discharge plan remains appropriate    Precautions / Restrictions Precautions Precautions: Fall Required Braces or Orthoses: Knee Immobilizer - Left Knee Immobilizer - Left: Other (comment) Restrictions Weight Bearing Restrictions: Yes LLE Weight Bearing: Weight bearing as tolerated   Pertinent Vitals/Pain 5/10 L Knee Pain    Mobility  Bed Mobility Bed Mobility: Sit to Supine;Sitting - Scoot to Edge of Bed Sitting - Scoot to Edge of Bed: 4: Min guard Sit to Supine: 4: Min assist Details for Bed Mobility Assistance: Min A for LLE.  Min guard as patient is slightly unsteady. Transfers Transfers: Sit to Stand;Stand to Sit Sit to Stand: With upper extremity assist;With armrests;4: Min guard;From chair/3-in-1 Stand to Sit: With upper extremity assist;4: Min guard;To bed Details for Transfer Assistance: Min guard as patient is still slightly unsteady. Ambulation/Gait Ambulation/Gait Assistance: 4: Min guard Ambulation Distance (Feet): 40 Feet Assistive device: Rolling walker Ambulation/Gait Assistance Details: Patient ambulated farther than yesterday afternoon.  Pushed herself to ambulate farther.  Patient had one dizzy spell. Gait Pattern: Step-to pattern;Decreased step length - right;Decreased step length - left;Decreased stance time -  left;Decreased weight shift to left Gait velocity: decreased    Exercises Total Joint Exercises Heel Slides: AAROM;Left;10 reps Straight Leg Raises: AAROM;Left;10 reps Long Arc Quad: AAROM;Seated;10 reps   PT Diagnosis:    PT Problem List:   PT Treatment Interventions:     PT Goals Acute Rehab PT Goals PT Goal: Sit to Supine/Side - Progress: Progressing toward goal PT Goal: Sit to Stand - Progress: Progressing toward goal PT Goal: Stand to Sit - Progress: Progressing toward goal PT Goal: Ambulate - Progress: Progressing toward goal PT Goal: Perform Home Exercise Program - Progress: Progressing toward goal  Visit Information  Last PT Received On: 04/15/13 Assistance Needed: +1    Subjective Data      Cognition  Cognition Arousal/Alertness: Awake/alert Behavior During Therapy: WFL for tasks assessed/performed Overall Cognitive Status: Within Functional Limits for tasks assessed    Balance     End of Session PT - End of Session Equipment Utilized During Treatment: Gait belt Activity Tolerance: Patient tolerated treatment well Patient left: with call bell/phone within reach;in bed;in CPM CPM Left Knee CPM Left Knee: On Left Knee Flexion (Degrees): 50 Left Knee Extension (Degrees): 0   GP     Leya Paige JEAN SPTA 04/15/2013, 12:01 PM

## 2013-04-16 ENCOUNTER — Other Ambulatory Visit: Payer: Self-pay | Admitting: *Deleted

## 2013-04-16 MED ORDER — OXYCODONE HCL 5 MG PO TABS: ORAL_TABLET | ORAL | Status: DC

## 2013-04-16 MED ORDER — METHADONE HCL 10 MG PO TABS: ORAL_TABLET | ORAL | Status: DC

## 2013-04-16 NOTE — Progress Notes (Signed)
Clinical social worker assisted with patient discharge to skilled nursing facility, [facility].  CSW addressed all family questions and concerns. CSW copied chart and added all important documents. CSW also set up patient transportation with [facility]. Clinical Social Worker will sign off for now as social work intervention is no longer needed.   Kadejah Sandiford, MSW,  [phone] 

## 2013-04-18 ENCOUNTER — Non-Acute Institutional Stay (SKILLED_NURSING_FACILITY): Payer: Medicaid Other | Admitting: Adult Health

## 2013-04-18 DIAGNOSIS — F1123 Opioid dependence with withdrawal: Secondary | ICD-10-CM

## 2013-04-18 DIAGNOSIS — F1120 Opioid dependence, uncomplicated: Secondary | ICD-10-CM

## 2013-04-18 DIAGNOSIS — M1710 Unilateral primary osteoarthritis, unspecified knee: Secondary | ICD-10-CM

## 2013-04-18 DIAGNOSIS — M1712 Unilateral primary osteoarthritis, left knee: Secondary | ICD-10-CM

## 2013-04-18 DIAGNOSIS — Z7901 Long term (current) use of anticoagulants: Secondary | ICD-10-CM

## 2013-04-18 DIAGNOSIS — R32 Unspecified urinary incontinence: Secondary | ICD-10-CM

## 2013-04-22 ENCOUNTER — Non-Acute Institutional Stay (SKILLED_NURSING_FACILITY): Payer: Medicaid Other | Admitting: Internal Medicine

## 2013-04-22 DIAGNOSIS — J45909 Unspecified asthma, uncomplicated: Secondary | ICD-10-CM

## 2013-04-22 DIAGNOSIS — Z96652 Presence of left artificial knee joint: Secondary | ICD-10-CM

## 2013-04-22 DIAGNOSIS — Z96659 Presence of unspecified artificial knee joint: Secondary | ICD-10-CM

## 2013-04-22 DIAGNOSIS — K219 Gastro-esophageal reflux disease without esophagitis: Secondary | ICD-10-CM

## 2013-04-29 NOTE — Progress Notes (Signed)
Patient ID: Gloria Patterson, female   DOB: 05-29-1951, 62 y.o.   MRN: [id number]           HISTORY & PHYSICAL  DATE:  04/22/2013  FACILITY: [facility]   LEVEL OF CARE:   SNF   CHIEF COMPLAINT:  Admission to SNF, post stay at [facility] from 04/08/2013 through 04/15/2013.     HISTORY OF PRESENT ILLNESS:  This is a 62 year-old woman who had severe left knee pain secondary to osteoarthritis, who failed conservative management.  She underwent a total knee arthroplasty on April 22nd.  She appears to have had a distal lateral condyle fracture, treated with plate fixation.  Otherwise, she does not seem to have had any particular difficulties.  She is here for rehabilitation and already doing quite well, able to mobilize, transfer.     PAST MEDICAL HISTORY:  History of narcotic abuse, on methadone for this, [facility].    Gastroesophageal reflux disease.    Urinary frequency.    Vaginal prolapse.    History of nocturia.    History of hepatitis, diagnosed in 1973.  I do not have much more information on this.    Chronic cough, followed by Dr. Sherene Sires, felt to be neurogenic cough and/or gastroesophageal reflux disease.    History of asthma dating back to the 1970s, although this does not seem to have been too significant.     CURRENT MEDICATIONS:  Discharge medications:    Colace 100 b.i.d.   Pepcid 20 p.o. q.h.s.   Neurontin 100  t.i.d.   Methadone 10 mg, 2 tablets equaling 20 mg p.o. q.6.  Robaxin 1 p.o. q.6 p.r.n.   Prilosec 20 p.o. q.a.m.   OxyIR 5 mg p.o. q.3 h p.r.n. pain and 10 mg p.o. q.3 h severe pain.    Proventil 2 puffs q.6 h p.r.n.   VESIcare 10 mg p.o. q.a.m.   Coumadin 5 mg p.o. q.d.  INR on 04/18/2013 was 2.68.  SOCIAL HISTORY: HOUSING:  The patient tells me that she lives in [place] with her daughter.   FUNCTIONAL STATUS:  Previously independent in ADLs and IADLs.  Used a cane.   TOBACCO USE:  Non-smoker.    FAMILY  HISTORY:  None, according to the patient.    REVIEW OF SYSTEMS:   CHEST/RESPIRATORY:  No cough.  No shortness of breath.  CARDIAC:   No chest pain.   GI:  No nausea,  vomiting or abdominal pain.  GU:  No dysuria.  Urinary frequency, but not different from her premorbid status.    PHYSICAL EXAMINATION:   VITAL SIGNS:   PULSE:   68.  RESPIRATIONS:  16.   GENERAL APPEARANCE:  The patient is in absolutely no distress.   CHEST/RESPIRATORY:  Clear air entry bilaterally without crackles or wheezes.  CARDIOVASCULAR:  CARDIAC:   Heart sounds are normal.  There are no murmurs.   GASTROINTESTINAL:  ABDOMEN:   Obese.  No tenderness.  No masses.   GENITOURINARY:  BLADDER:   Not distended.   MUSCULOSKELETAL:   EXTREMITIES:   RIGHT LOWER EXTREMITY:  Nice clean incision over her left knee.  No evidence of infection.  This is well approximated.  There is no evidence of a DVT.  PSYCHIATRIC:   MENTAL STATUS:   Cognizant, conversational.    ASSESSMENT/PLAN:  Status post left total knee replacement.  Really no problems here at all.  She is on Coumadin for DVT prophylaxis.  I do not think  this will need to be present at discharge.    Asthma.  This seems clinically stable at the moment.    Chronic cough, multifactorial.  However, she did not cough while I was in the room with her and this does not mostly appear to be nocturnal.    Gastroesophageal reflux disease, which the patient states is quite severe and prolonged.    History of narcotic abuse.  On methadone.   We have also found that we cannot prescribe this in long-term care.  For this reason, [facility].    I suspect this patient's stay here will actually be reasonably short.  She is already transferring, ambulating.    CPT CODE: 47829

## 2013-05-08 ENCOUNTER — Other Ambulatory Visit: Payer: Self-pay | Admitting: *Deleted

## 2013-05-08 MED ORDER — OXYCODONE HCL 5 MG PO TABS: ORAL_TABLET | ORAL | Status: DC

## 2013-05-23 ENCOUNTER — Non-Acute Institutional Stay (SKILLED_NURSING_FACILITY): Payer: Medicare Other | Admitting: Adult Health

## 2013-05-23 ENCOUNTER — Encounter: Payer: Self-pay | Admitting: Adult Health

## 2013-05-23 DIAGNOSIS — F1120 Opioid dependence, uncomplicated: Secondary | ICD-10-CM

## 2013-05-23 DIAGNOSIS — K219 Gastro-esophageal reflux disease without esophagitis: Secondary | ICD-10-CM

## 2013-05-23 DIAGNOSIS — J209 Acute bronchitis, unspecified: Secondary | ICD-10-CM

## 2013-05-23 DIAGNOSIS — M1712 Unilateral primary osteoarthritis, left knee: Secondary | ICD-10-CM

## 2013-05-23 DIAGNOSIS — M1710 Unilateral primary osteoarthritis, unspecified knee: Secondary | ICD-10-CM

## 2013-05-23 DIAGNOSIS — F1123 Opioid dependence with withdrawal: Secondary | ICD-10-CM | POA: Insufficient documentation

## 2013-05-23 NOTE — Assessment & Plan Note (Signed)
Is worse; will make her albuterol inhaler 2 puffs every 6 hours routine for 2 weeks then reduce back to every 6 hours as needed; will begin her on a zpack and will begin mucinex twice daily for 2 weeks and will monitor her status

## 2013-05-23 NOTE — Assessment & Plan Note (Signed)
She continues to go to the methadone clinic on a daily basis in order to receive her medication and is doing well at this time; will continue to monitor her status

## 2013-05-23 NOTE — Assessment & Plan Note (Signed)
Is stable will continue prilosec 20 mg in the am and pepcid 20 mg in the pm.

## 2013-05-23 NOTE — Progress Notes (Signed)
Patient ID: Gloria Patterson, female   DOB: Aug 27, 1951, 62 y.o.   MRN: [id number]  FACILITY: [facility]   Allergies  Allergen Reactions  . Phenobarbital Other (See Comments)    seizures     Chief Complaint  Patient presents with  . Medical Managment of Chronic Issues    HPI: She is being seen for the management of chronic illnesses; overall she is doing well with therapy. She is complaining of cough with green sputum; cough with shortness of breath. She denies any fever present there are no reports of any change in appetite   Past Medical History  Diagnosis Date  . 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 GERD (gastroesophageal reflux disease)   . Frequency of urination   . Urgency of urination   . Vaginal vault prolapse     ANTERIOR  . Nocturia   . History of hepatitis DX 1973  SECONDARY TO DRUG ABUSE--  UNKNOWN TYPE PER PT--  WAS TX'D     NO ISSURES OR S & S SINCE  . Chronic cough   . Asthma     MILD    Past Surgical History  Procedure Laterality Date  . Anterior and posterior repair N/A 03/10/2013    Procedure: ANTERIOR (CYSTOCELE) AND POSTERIOR REPAIR (RECTOCELE);  Surgeon: Kathi Ludwig, MD;  Location: [facility];  Service: Urology;  Laterality: N/A;   [employer] SCI[employer]IFIC UPHOLD LITE ANTERIOR VAULT REPAIR  POSSIBLE OP WITH OBSERVATION POSSIBLE FLOOR BED  Uphold LITE w/Capio SLIM Catalog number [id number] qty 2 Xenform soft tissue repair matrix 6x7 cm [id number]   . Cystocele repair N/A 03/10/2013    Procedure:  Anterior vaginal vault prolapse repair, with [employer] uphold light sacrospinous fixation using U. device and mesh repair with [name] plication   ;  Surgeon: Kathi Ludwig, MD;  Location: [facility];  Service: Urology;  Laterality: N/A;  . Breast surgery      B/L removal of benign cysts  . Tubal ligation    . 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    Procedure: TOTAL KNEE  ARTHROPLASTY;  Surgeon: Cammy Copa, MD;  Location: [facility] OR;  Service: Orthopedics;  Laterality: Left;  Left total knee arthroplasty    VITAL SIGNS BP 124/78  Pulse 68  Ht 4\' 9"  (1.448 m)  Wt 186 lb (84.369 kg)  BMI 40.24 kg/m2   Patient's Medications  New Prescriptions   No medications on file  Previous Medications   ALBUTEROL (PROVENTIL HFA) 108 (90 BASE) MCG/ACT INHALER    Inhale 2 puffs into the lungs every 6 (six) hours as needed for shortness of breath.    DOCUSATE SODIUM 100 MG CAPS    Take 100 mg by mouth 2 (two) times daily.   FAMOTIDINE (PEPCID) 20 MG TABLET    Take 20 mg by mouth every evening.   GABAPENTIN (NEURONTIN) 100 MG CAPSULE    Take 100 mg by mouth 3 (three) times daily.   METHOCARBAMOL (ROBAXIN) 500 MG TABLET    Take 1 tablet (500 mg total) by mouth every 6 (six) hours as needed.   OMEPRAZOLE (PRILOSEC) 20 MG CAPSULE    Take 20 mg by mouth every morning.   OXYCODONE (OXY IR/ROXICODONE) 5 MG IMMEDIATE RELEASE TABLET    Take 1 tablet every 3 hours as needed for moderate pain, Take 2 tablets every three hours as needed for severe pain   SOLIFENACIN (VESICARE) 10 MG  TABLET    Take 10 mg by mouth daily.  Modified Medications   Modified Medication Previous Medication   METHADONE (DOLOPHINE) 10 MG TABLET methadone (DOLOPHINE) 10 MG tablet      Take 70 mg by mouth daily. Receives at methadone clinic    Take 2 tablets every 6 hours  Discontinued Medications   OXYCODONE (OXY IR/ROXICODONE) 5 MG IMMEDIATE RELEASE TABLET    Take 1-3 tablets (5-15 mg total) by mouth every 3 (three) hours as needed.   WARFARIN (COUMADIN) 5 MG TABLET    Take 1 tablet (5 mg total) by mouth daily.    SIGNIFICANT DIAGNOSTIC EXAMS     Component Value Date/Time   ALBUMIN 3.3* 04/08/2013 1151   AST 73* 04/08/2013 1151   ALT 50* 04/08/2013 1151   ALKPHOS 100 04/08/2013 1151   BILITOT 0.4 04/08/2013 1151       Component Value Date/Time   BUN 7 04/09/2013 0859   GLUCOSE 252* 04/09/2013 0859    CREATININE 0.59 04/09/2013 0859   K 3.5 04/09/2013 0859   NA 135 04/09/2013 0859       Component Value Date/Time   WBC 10.3 04/13/2013 0630   RBC 2.98* 04/13/2013 0630   HGB 9.3* 04/13/2013 0630   HCT 26.8* 04/13/2013 0630   PLT 203 04/13/2013 0630   MCV 89.9 04/13/2013 0630     Review of Systems  Constitutional: Negative for fever and malaise/fatigue.  HENT: Negative for congestion and sore throat.   Respiratory: Positive for cough, shortness of breath and wheezing.   Cardiovascular: Positive for leg swelling. Negative for chest pain and palpitations.  Gastrointestinal: Negative for heartburn, abdominal pain and constipation.  Musculoskeletal: Positive for joint pain. Negative for myalgias.       Left knee is managed  Skin: Negative.   Neurological: Negative for headaches.  Psychiatric/Behavioral: The patient does not have insomnia.      Physical Exam  Constitutional: She is oriented to person, place, and time. She appears well-developed and well-nourished.  overweight  Neck: Normal range of motion. Neck supple. No JVD present. No thyromegaly present.  Cardiovascular: Normal rate, regular rhythm and intact distal pulses.   Respiratory: Effort normal. She has wheezes.  Has wheezes and rhonchi throughout.   GI: Soft. Bowel sounds are normal. She exhibits no distension. There is no tenderness.  Musculoskeletal: She exhibits no edema.  Has limited range of motion in her left knee is using a walker  Lymphadenopathy:    She has no cervical adenopathy.  Neurological: She is alert and oriented to person, place, and time.  Skin: Skin is warm and dry.  Psychiatric: She has a normal mood and affect.       ASSESSMENT/ PLAN:  GERD (gastroesophageal reflux disease) Is stable will continue prilosec 20 mg in the am and pepcid 20 mg in the pm.   Narcotic withdrawal She continues to go to the methadone clinic on a daily basis in order to receive her medication and is doing well at  this time; will continue to monitor her status   Osteoarthritis of left knee She is stable will continue her neurontin 100 mg three times daily oxycodone 5 or 10 mg every 3 hours as needed for pain and robaxin 500 mg every 6 hours as needed   Acute bronchitis Is worse; will make her albuterol inhaler 2 puffs every 6 hours routine for 2 weeks then reduce back to every 6 hours as needed; will begin her on a zpack and  will begin mucinex twice daily for 2 weeks and will monitor her status    Time spent with patient 45 minutes

## 2013-05-23 NOTE — Assessment & Plan Note (Signed)
She is stable will continue her neurontin 100 mg three times daily oxycodone 5 or 10 mg every 3 hours as needed for pain and robaxin 500 mg every 6 hours as needed

## 2013-06-17 ENCOUNTER — Ambulatory Visit: Payer: Medicaid Other | Admitting: Physical Therapy

## 2013-06-24 ENCOUNTER — Ambulatory Visit: Payer: Medicare Other | Attending: Orthopedic Surgery | Admitting: Physical Therapy

## 2013-06-24 DIAGNOSIS — R609 Edema, unspecified: Secondary | ICD-10-CM | POA: Insufficient documentation

## 2013-06-24 DIAGNOSIS — IMO0001 Reserved for inherently not codable concepts without codable children: Secondary | ICD-10-CM | POA: Insufficient documentation

## 2013-06-24 DIAGNOSIS — Z96659 Presence of unspecified artificial knee joint: Secondary | ICD-10-CM | POA: Insufficient documentation

## 2013-06-24 DIAGNOSIS — M25569 Pain in unspecified knee: Secondary | ICD-10-CM | POA: Insufficient documentation

## 2013-06-24 DIAGNOSIS — M6281 Muscle weakness (generalized): Secondary | ICD-10-CM | POA: Insufficient documentation

## 2013-06-24 DIAGNOSIS — R269 Unspecified abnormalities of gait and mobility: Secondary | ICD-10-CM | POA: Insufficient documentation

## 2013-06-24 DIAGNOSIS — M25669 Stiffness of unspecified knee, not elsewhere classified: Secondary | ICD-10-CM | POA: Insufficient documentation

## 2013-07-01 ENCOUNTER — Ambulatory Visit: Payer: Medicare Other | Admitting: Rehabilitation

## 2013-07-08 ENCOUNTER — Ambulatory Visit: Payer: Medicare Other | Admitting: Rehabilitation

## 2013-07-14 ENCOUNTER — Ambulatory Visit: Payer: Medicare Other | Admitting: Physical Therapy

## 2013-07-15 ENCOUNTER — Other Ambulatory Visit: Payer: Self-pay

## 2013-07-16 ENCOUNTER — Ambulatory Visit: Payer: Medicare Other | Admitting: Rehabilitation

## 2013-07-23 ENCOUNTER — Ambulatory Visit: Payer: Medicaid Other | Attending: Orthopedic Surgery | Admitting: Physical Therapy

## 2013-07-23 DIAGNOSIS — M25669 Stiffness of unspecified knee, not elsewhere classified: Secondary | ICD-10-CM | POA: Insufficient documentation

## 2013-07-23 DIAGNOSIS — IMO0001 Reserved for inherently not codable concepts without codable children: Secondary | ICD-10-CM | POA: Insufficient documentation

## 2013-07-23 DIAGNOSIS — R609 Edema, unspecified: Secondary | ICD-10-CM | POA: Insufficient documentation

## 2013-07-23 DIAGNOSIS — R269 Unspecified abnormalities of gait and mobility: Secondary | ICD-10-CM | POA: Insufficient documentation

## 2013-07-23 DIAGNOSIS — Z96659 Presence of unspecified artificial knee joint: Secondary | ICD-10-CM | POA: Insufficient documentation

## 2013-07-23 DIAGNOSIS — M6281 Muscle weakness (generalized): Secondary | ICD-10-CM | POA: Insufficient documentation

## 2013-07-23 DIAGNOSIS — M25569 Pain in unspecified knee: Secondary | ICD-10-CM | POA: Insufficient documentation

## 2013-07-29 ENCOUNTER — Ambulatory Visit: Payer: Medicaid Other | Admitting: Rehabilitation

## 2013-07-31 ENCOUNTER — Ambulatory Visit: Payer: Medicaid Other | Admitting: Physical Therapy

## 2013-08-07 ENCOUNTER — Ambulatory Visit: Payer: Medicaid Other | Admitting: Physical Therapy

## 2013-08-14 ENCOUNTER — Ambulatory Visit: Payer: Medicaid Other | Admitting: Rehabilitation

## 2013-08-19 ENCOUNTER — Ambulatory Visit: Payer: Medicaid Other | Attending: Orthopedic Surgery | Admitting: Rehabilitation

## 2013-08-19 ENCOUNTER — Ambulatory Visit: Payer: Medicaid Other | Admitting: Internal Medicine

## 2013-08-19 DIAGNOSIS — M6281 Muscle weakness (generalized): Secondary | ICD-10-CM | POA: Insufficient documentation

## 2013-08-19 DIAGNOSIS — Z96659 Presence of unspecified artificial knee joint: Secondary | ICD-10-CM | POA: Insufficient documentation

## 2013-08-19 DIAGNOSIS — M25669 Stiffness of unspecified knee, not elsewhere classified: Secondary | ICD-10-CM | POA: Insufficient documentation

## 2013-08-19 DIAGNOSIS — R609 Edema, unspecified: Secondary | ICD-10-CM | POA: Insufficient documentation

## 2013-08-19 DIAGNOSIS — IMO0001 Reserved for inherently not codable concepts without codable children: Secondary | ICD-10-CM | POA: Insufficient documentation

## 2013-08-19 DIAGNOSIS — M25569 Pain in unspecified knee: Secondary | ICD-10-CM | POA: Insufficient documentation

## 2013-08-19 DIAGNOSIS — R269 Unspecified abnormalities of gait and mobility: Secondary | ICD-10-CM | POA: Insufficient documentation

## 2013-08-21 ENCOUNTER — Ambulatory Visit: Payer: Medicaid Other | Admitting: Physical Therapy

## 2013-08-23 ENCOUNTER — Other Ambulatory Visit: Payer: Self-pay | Admitting: Adult Health

## 2013-08-25 ENCOUNTER — Ambulatory Visit (INDEPENDENT_AMBULATORY_CARE_PROVIDER_SITE_OTHER): Payer: PRIVATE HEALTH INSURANCE | Admitting: Internal Medicine

## 2013-08-25 ENCOUNTER — Encounter: Payer: Self-pay | Admitting: Internal Medicine

## 2013-08-25 VITALS — BP 176/94 | HR 79 | Temp 98.4°F | Ht 61.0 in | Wt 189.2 lb

## 2013-08-25 DIAGNOSIS — I10 Essential (primary) hypertension: Secondary | ICD-10-CM | POA: Insufficient documentation

## 2013-08-25 DIAGNOSIS — R05 Cough: Secondary | ICD-10-CM

## 2013-08-25 MED ORDER — OMEPRAZOLE 20 MG PO CPDR: 20.0000 mg | DELAYED_RELEASE_CAPSULE | Freq: Every morning | ORAL | Status: DC

## 2013-08-25 MED ORDER — CLONIDINE HCL 0.1 MG PO TABS: 0.1000 mg | ORAL_TABLET | Freq: Two times a day (BID) | ORAL | Status: DC

## 2013-08-25 MED ORDER — GABAPENTIN 100 MG PO CAPS: 100.0000 mg | ORAL_CAPSULE | Freq: Three times a day (TID) | ORAL | Status: DC

## 2013-08-25 MED ORDER — PREDNISONE (PAK) 10 MG PO TABS: ORAL_TABLET | ORAL | Status: DC

## 2013-08-25 MED ORDER — FAMOTIDINE 20 MG PO TABS: 20.0000 mg | ORAL_TABLET | Freq: Every evening | ORAL | Status: DC

## 2013-08-25 NOTE — Patient Instructions (Addendum)
Take delsym two tsp every 12 hours every 12 hours as needed for cough or throat clearing   Clonidine 0.1 mg twice daily   neurontin 100mg  three times a day   Resume prilosec 20 mg Take 30-60 min before first meal of the day and pepcid 20 mg one at bedtime  Prednisone 10 mg take  4 each am x 2 days,   2 each am x 2 days,  1 each am x2days and stop   GERD (REFLUX)  is an extremely common cause of respiratory symptoms, many times with no significant heartburn at all.    It can be treated with medication, but also with lifestyle changes including avoidance of late meals, excessive alcohol, smoking cessation, and avoid fatty foods, chocolate, peppermint, colas, red wine, and acidic juices such as orange juice.  NO MINT OR MENTHOL PRODUCTS SO NO COUGH DROPS  USE SUGARLESS CANDY INSTEAD (jolley ranchers or Stover's)  NO OIL BASED VITAMINS - use powdered substitutes.   See Tammy NP w/in 2 weeks with all your medications, even over the counter meds, separated in two separate bags, the ones you take no matter what vs the ones you stop once you feel better and take only as needed when you feel you need them.   Tammy  will generate for you a new user friendly medication calendar that will put [place] all on the same page re: your medication use.     Without this process, it simply isn't possible to assure that we are providing  your outpatient care  with  the attention to detail we feel you deserve.   If we cannot assure that you're getting that kind of care,  then we cannot manage your problem effectively from this clinic.  Once you have seen Tammy and we are sure that we're all on the same page with your medication use she will arrange follow up with me.  Late add:  Pt may be medically illiterate or just difficulty without reading glasses but could not read instructions back at [facility]

## 2013-08-25 NOTE — Assessment & Plan Note (Signed)
-   Sinus CT 01/02/13  1. Negative paranasal sinuses. 2. 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   These are the same pts (now being labeled as having "irritable larynx syndrome" by some cough centers) who not infrequently have a history of having failed to tolerate ace inhibitors,  dry powder inhalers or biphosphonates or report having atypical reflux symptoms that don't respond to standard doses of PPI , and are easily confused as having aecopd or asthma flares by even experienced allergists/ pulmonologists.    At this point it's not clear whether her cough meds have failed or she's failed to take them  rec rechallenge with rx directed at [facility] and then regroup using a trust but verify approach.    To keep things simple, I have asked the patient to first separate medicines that are perceived as maintenance, that is to be taken daily "no matter what", from those medicines that are taken on only on an as-needed basis and I have given the patient examples of both, and then return to see our NP to generate a  detailed  medication calendar which should be followed until the next physician sees the patient and updates it.

## 2013-08-25 NOTE — Assessment & Plan Note (Signed)
Best choice is clonidine based on narcotic dependency   rx clonidine 0.1 mg twice daily

## 2013-08-25 NOTE — Progress Notes (Signed)
Subjective:    Patient ID: Gloria Patterson, female    DOB: [birth year]   MRN: [id number]    Brief patient profile:  89 yobf never smoker  dx with asthma 1980's= wheeze and sob and required daily inhalers until around 2000 when stopped taking IV drugs and onset of cough after stopped drugs daily since and worse since 2011 so referred [facility] 10/16/2012 to pulmonary clinic for cough.  HPI 10/16/2012 1st pulmonary eval cc daily cough x 13 years worse x 2 years daily x year round prod min mucus white better on cough suppression, inhalers with proventil and qvar > no better.  Night when lie down worse and has to sit up eventually goes away and sleeps ok without am exac.  Did best with prednisone x 2 weeks after  Using cough drops and having food sticks in throat rec Stop qvar and just yellow inhaler proventil if needed > did not need any Prednisone 10 mg take  4 each am x 2 days,   2 each am x 2 days,  1 each am x2days and stop  Try dexilant 60 mg   Take one 30-60 min before first meal of the day and Pepcid 20 mg one bedtime until return GERD diet  10/30/2012 f/u ov/Mannix Kroeker cc completely better p prednisone then cough recurred about half as severe the day after prednisone completed, but no use of inhalers at all since last ov and no excess mucus produciton.  rec Omeprazole  20mg   Take 30-60 min before first meal of the day and Pepcid 20 mg one bedtime until  Return Prednisone 10 mg take  4 each am x 2 days,   2 each am x 2 days,  1 each am x2days and stop  GERD diet   12/05/2012 f/u ov/Tsering Leaman cc much better to her satisfaction but Still with urge to clear throat however.  rec Sinus ct > neg Allergy profile > declined  01/16/2013 f/u ov/Jeremie Abdelaziz cc cough recurred when ran out of meds because she couldn't afford them, not clear which meds helped the most. No sob rec Take delsym two tsp every 12 hours and supplement if needed with  tramadol 50 mg up to 2 every 4 hours to suppress the urge to  cough. Swallowing water or using ice chips/non mint and menthol containing candies (such as lifesavers or sugarless jolly ranchers) are also effective.  You should rest your voice and avoid activities that you know make you cough. Once you have eliminated the cough for 3 straight days try reducing the tramadol first,  then the delsym as tolerated.   Resume omeprazole 20 mg Take 30-60 min before first meal of the day and pepcid 20 mg one at bedtime GERD  03/06/2013 f/u ov/Elidia Bonenfant cc cough worse during day, dry, hacking, could not take tramadol, prednisone seemed to help rec Take delsym two tsp every 12 hours every 12 hours Neurontin 100mg  three time a day  Resume prilosec 20 mg Take 30-60 min before first meal of the day and pepcid 20 mg one at bedtime Prednisone 10 mg take  4 each am x 2 days,   2 each am x 2 days,  1 each am x2days and stop  GERD (REFLUX) diet  Please schedule a follow up office visit in 4 weeks, sooner if needed with all active active medications in your hand > did not do    08/25/2013 f/u ov/Mamie Hundertmark confused with meds Chief Complaint  Patient presents with  .  Follow-up    Pt states her cough has improved some since last visit, but is still present- mainly non prod but sometimes produces minimal yellow sputum. Clearing her throat alot. Using ventolin at least twice per day.      No obvious pattern to day to day or  daytime variabilty or assoc or cp or chest tightness, subjective wheeze overt sinus or hb symptoms. No unusual exp hx.      Sleeping ok without nocturnal  or early am exacerbation  of respiratory  c/o's or need for noct saba. Also denies any obvious fluctuation of symptoms with weather or environmental changes or other aggravating or alleviating factors except as outlined above    Current Medications, Allergies, Complete Past Medical History, Past Surgical History, Family History, and Social History were reviewed in [employer] record.  ROS   The following are not active complaints unless bolded sore throat, dysphagia, dental problems, itching, sneezing,  nasal congestion or excess/ purulent secretions, ear ache,   fever, chills, sweats, unintended wt loss, pleuritic or exertional cp, hemoptysis,  orthopnea pnd or leg swelling, presyncope, palpitations, heartburn, abdominal pain, anorexia, nausea, vomiting, diarrhea  or change in bowel or urinary habits, change in stools or urine, dysuria,hematuria,  rash, arthralgias, visual complaints, headache, numbness weakness or ataxia or problems with walking or coordination,  change in mood/affect or memory.                Objective:   Physical Exam  edentuous amb bf nad with min pseudowheeze on insp only   and continuous vigorous throat clearing   Wt 193 10/16/2012  > 192 10/30/2012 > 193 12/05/2012 > 01/15/2013  194 > 03/06/2013  195 > 08/25/2013 189   HEENT: edentulous  turbinates, and orophanx. Nl external ear canals without cough reflex   NECK :  without JVD/Nodes/TM/ nl carotid upstrokes bilaterally   LUNGS: no acc muscle use, clear to A and P bilaterally without cough on insp or exp maneuvers   CV:  RRR  no s3 or murmur or increase in P2, no edema   ABD:  soft and nontender with nl excursion in the supine position. No bruits or organomegaly, bowel sounds nl  MS:  warm without deformities, calf tenderness, cyanosis or clubbing      cxr 09/30/12 report reviewed No active dz    Assessment & Plan:

## 2013-08-29 ENCOUNTER — Encounter: Payer: Self-pay | Admitting: *Deleted

## 2013-08-29 ENCOUNTER — Telehealth: Payer: Self-pay | Admitting: Internal Medicine

## 2013-08-29 NOTE — Telephone Encounter (Signed)
Spoke with the pt to verify the msg Letter faxed confirming her appt was kept- [phone] Nothing further needed per pt

## 2013-09-03 ENCOUNTER — Ambulatory Visit: Payer: PRIVATE HEALTH INSURANCE | Admitting: Adult Health

## 2013-09-03 ENCOUNTER — Ambulatory Visit (INDEPENDENT_AMBULATORY_CARE_PROVIDER_SITE_OTHER)
Admission: RE | Admit: 2013-09-03 | Discharge: 2013-09-03 | Disposition: A | Discharge status: Home / Self Care | Payer: PRIVATE HEALTH INSURANCE | Source: Ambulatory Visit | Attending: Adult Health | Admitting: Adult Health

## 2013-09-03 ENCOUNTER — Encounter: Payer: Self-pay | Admitting: Adult Health

## 2013-09-03 VITALS — BP 138/84 | HR 75 | Temp 98.7°F | Wt 190.8 lb

## 2013-09-03 DIAGNOSIS — R05 Cough: Secondary | ICD-10-CM

## 2013-09-03 DIAGNOSIS — J209 Acute bronchitis, unspecified: Secondary | ICD-10-CM

## 2013-09-03 MED ORDER — PREDNISONE 10 MG PO TABS: ORAL_TABLET | ORAL | Status: DC

## 2013-09-03 MED ORDER — AMOXICILLIN-POT CLAVULANATE 875-125 MG PO TABS: 1.0000 | ORAL_TABLET | Freq: Two times a day (BID) | ORAL | Status: AC

## 2013-09-03 NOTE — Assessment & Plan Note (Signed)
Flare with associated bronchitis  Cough seems to be steroid responsive  ? Asthma component , needs spirometry /PFT pre/post in future once flare is over.   Patient's medications were reviewed today and patient education was given. Computerized medication calendar was adjusted/completed Check cxr today   Plan  Augmentin 875mg  Twice daily  For days  Prednisone taper over next week.  Follow med calendar closely and bring to each visit.  follow up Dr. Sherene Sires  In 6 weeks and As needed   I will call with xray results  Please contact office for sooner follow up if symptoms do not improve or worsen or seek emergency care

## 2013-09-03 NOTE — Patient Instructions (Addendum)
Augmentin 875mg  Twice daily  For days  Prednisone taper over next week.  Follow med calendar closely and bring to each visit.  follow up Dr. Sherene Sires  In 6 weeks and As needed   I will call with xray results  Please contact office for sooner follow up if symptoms do not improve or worsen or seek emergency care

## 2013-09-03 NOTE — Progress Notes (Signed)
Subjective:    Patient ID: Gloria Patterson, female    DOB: 1951-12-17   MRN: [id number]   Brief patient profile:  60 yobf never smoker  dx with asthma 1980's= wheeze and sob and required daily inhalers until around 2000 when stopped taking IV drugs and onset of cough after stopped drugs daily since and worse since 2011 so referred [facility] 10/16/2012 to pulmonary clinic for cough.  HPI 10/16/2012 1st pulmonary eval cc daily cough x 13 years worse x 2 years daily x year round prod min mucus white better on cough suppression, inhalers with proventil and qvar > no better.  Night when lie down worse and has to sit up eventually goes away and sleeps ok without am exac.  Did best with prednisone x 2 weeks after  Using cough drops and having food sticks in throat rec Stop qvar and just yellow inhaler proventil if needed > did not need any Prednisone 10 mg take  4 each am x 2 days,   2 each am x 2 days,  1 each am x2days and stop  Try dexilant 60 mg   Take one 30-60 min before first meal of the day and Pepcid 20 mg one bedtime until return GERD diet  10/30/2012 f/u ov/Wert cc completely better p prednisone then cough recurred about half as severe the day after prednisone completed, but no use of inhalers at all since last ov and no excess mucus produciton.  rec Omeprazole  20mg   Take 30-60 min before first meal of the day and Pepcid 20 mg one bedtime until  Return Prednisone 10 mg take  4 each am x 2 days,   2 each am x 2 days,  1 each am x2days and stop  GERD diet   12/05/2012 f/u ov/Wert cc much better to her satisfaction but Still with urge to clear throat however.  rec Sinus ct > neg Allergy profile > declined  01/16/2013 f/u ov/Wert cc cough recurred when ran out of meds because she couldn't afford them, not clear which meds helped the most. No sob rec Take delsym two tsp every 12 hours and supplement if needed with  tramadol 50 mg up to 2 every 4 hours to suppress the urge to  cough. Swallowing water or using ice chips/non mint and menthol containing candies (such as lifesavers or sugarless jolly ranchers) are also effective.  You should rest your voice and avoid activities that you know make you cough. Once you have eliminated the cough for 3 straight days try reducing the tramadol first,  then the delsym as tolerated.   Resume omeprazole 20 mg Take 30-60 min before first meal of the day and pepcid 20 mg one at bedtime GERD  03/06/2013 f/u ov/Wert cc cough worse during day, dry, hacking, could not take tramadol, prednisone seemed to help rec Take delsym two tsp every 12 hours every 12 hours Neurontin 100mg  three time a day  Resume prilosec 20 mg Take 30-60 min before first meal of the day and pepcid 20 mg one at bedtime Prednisone 10 mg take  4 each am x 2 days,   2 each am x 2 days,  1 each am x2days and stop  GERD (REFLUX) diet  Please schedule a follow up office visit in 4 weeks, sooner if needed with all active active medications in your hand > did not do  08/25/2013 f/u ov/Wert confused with meds Chief Complaint  Patient presents with  . Follow-up  Pt states her cough has improved some since last visit, but is still present- mainly non prod but sometimes produces minimal yellow sputum. Clearing her throat alot. Using ventolin at least twice per day.   >>pred taper  09/03/2013 Follow up and med review   new med calendar - pt brought all meds with her today.  Reviewed all her meds and organized them into med calendar with pt education.  Unclear if she is taking correctly, daughter helps her with her meds and she does not know the names well.   does report some increased cough x6 days with small amount of light yellow mucus, wheezing, increased SOB. Says her cough got much better on prednsione but came back when she finished steroid dose.   She denies any hemoptysis, orthopnea, PND, leg swelling    Current Medications, Allergies, Complete Past Medical  History, Past Surgical History, Family History, and Social History were reviewed in [employer] record.  ROS  The following are not active complaints unless bolded sore throat, dysphagia, dental problems, itching, sneezing,  nasal congestion or excess/ purulent secretions, ear ache,   fever, chills, sweats, unintended wt loss, pleuritic or exertional cp, hemoptysis,  orthopnea pnd or leg swelling, presyncope, palpitations, heartburn, abdominal pain, anorexia, nausea, vomiting, diarrhea  or change in bowel or urinary habits, change in stools or urine, dysuria,hematuria,  rash, arthralgias, visual complaints, headache, numbness weakness or ataxia or problems with walking or coordination,  change in mood/affect or memory.                Objective:   Physical Exam  edentuous amb bf nad with min pseudowheeze on insp only   and continuous vigorous throat clearing   Wt 193 10/16/2012  > 192 10/30/2012 > 193 12/05/2012 > 01/15/2013  194 > 03/06/2013  195 > 08/25/2013 189>190    HEENT: edentulous  turbinates, and orophanx. Nl external ear canals without cough reflex   NECK :  without JVD/Nodes/TM/ nl carotid upstrokes bilaterally   LUNGS: no acc muscle use, clear to A and P bilaterally without cough on insp or exp maneuvers +psuedowheezing   CV:  RRR  no s3 or murmur or increase in P2, no edema   ABD:  soft and nontender with nl excursion in the supine position. No bruits or organomegaly, bowel sounds nl  MS:  warm without deformities, calf tenderness, cyanosis or clubbing      cxr 09/30/12 report reviewed No active dz    Assessment & Plan:

## 2013-09-03 NOTE — Assessment & Plan Note (Signed)
Flare with underlying cyclical cough   Plan  Augmentin 875mg  Twice daily  For days  Prednisone taper over next week.  Follow med calendar closely and bring to each visit.  follow up Dr. Sherene Sires  In 6 weeks and As needed   I will call with xray results  Please contact office for sooner follow up if symptoms do not improve or worsen or seek emergency care

## 2013-09-04 NOTE — Progress Notes (Signed)
Patient ID: Gloria Patterson, female   DOB: Sep 01, 1951, 62 y.o.   MRN: [id number]  [place]  Allergies  Allergen Reactions  . Phenobarbital Other (See Comments)    seizures    Chief Complaint  Patient presents with  . Hospitalization Follow-up    HPI  She has been hospitalized for a left knee replacement. She is here for short term rehab and will return back home. She is also being followed by the methadone clinic; she will need to go on a daily basis to the clinic in order for her to receive this medication. She is aware of this and is in agreement; the facility is aware as well.   Past Medical History  Diagnosis Date  . 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 GERD (gastroesophageal reflux disease)   . Frequency of urination   . Urgency of urination   . Vaginal vault prolapse     ANTERIOR  . Nocturia   . History of hepatitis DX 1973  SECONDARY TO DRUG ABUSE--  UNKNOWN TYPE PER PT--  WAS TX'D     NO ISSURES OR S & S SINCE  . Chronic cough   . Asthma     MILD    Past Surgical History  Procedure Laterality Date  . Anterior and posterior repair N/A 03/10/2013    Procedure: ANTERIOR (CYSTOCELE) AND POSTERIOR REPAIR (RECTOCELE);  Surgeon: Kathi Ludwig, MD;  Location: [facility];  Service: Urology;  Laterality: N/A;   [employer] UPHOLD LITE ANTERIOR VAULT REPAIR  POSSIBLE OP WITH OBSERVATION POSSIBLE FLOOR BED  Uphold LITE w/[employer] SLIM Catalog number [id number] qty 2 Xenform soft tissue repair matrix 6x7 cm [id number]   . Cystocele repair N/A 03/10/2013    Procedure:  Anterior vaginal vault prolapse repair, with [employer] uphold light sacrospinous fixation using U. device and mesh repair with [name] plication   ;  Surgeon: Kathi Ludwig, MD;  Location: [facility];  Service: Urology;  Laterality: N/A;  . Breast surgery      B/L removal of benign cysts  . Tubal ligation    .  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    Procedure: TOTAL KNEE ARTHROPLASTY;  Surgeon: Cammy Copa, MD;  Location: [facility] OR;  Service: Orthopedics;  Laterality: Left;  Left total knee arthroplasty   MEDICATIONS:  Methadone clinic Colace 100 mg twice daily pepcid 20 mg daily neurontin 100 mg three times daily Robaxin 500 mg every 6 hours as needed pprilosec mg every am Oxycodone 5 or 10 mg every 3 hours as needed for pain Albuterol 2 puffs every 6 hours as needed vesicare 10 mg daily Coumadin 5 mg daily  Filed Vitals:   04/18/13 1004  BP: 144/62  Pulse: 86  Height: 4\' 9"  (1.448 m)  Weight: 194 lb (87.998 kg)    Review of Systems  Constitutional: Negative for malaise/fatigue.  Respiratory: Negative for cough and shortness of breath.   Cardiovascular: Negative for chest pain, palpitations and leg swelling.  Gastrointestinal: Negative for heartburn, abdominal pain and constipation.  Musculoskeletal: Positive for joint pain. Negative for myalgias.       From joint replacement; pain is being managed  Skin: Negative.   Neurological: Negative for dizziness and headaches.  Psychiatric/Behavioral: Negative for depression. The patient is not nervous/anxious.      Physical Exam  Constitutional: She appears well-developed and well-nourished.  overweight  Neck: Neck supple. No JVD present.  Cardiovascular: Normal rate, regular rhythm and intact distal pulses.   Respiratory: Effort normal and breath sounds normal.  GI: Soft. Bowel sounds are normal. She exhibits no distension. There is no tenderness.  Musculoskeletal: She exhibits no edema.  Able to move all extremities is out of bed with walker; limited range of motion due to joint replacement   Neurological: She is alert.  Skin: Skin is warm and dry.  Incision line without signs of infection present.   Psychiatric: She has a normal mood and affect.    ASSESSMENT AND PLAN  1. Left knee replacement: will continue therapy  as directed and will continue to monitor her status; will continue her neurontin 100 mg three times daily  oxycodone 5-10 mg every 3 hours as needed and robaxin 500 mg every 6 hours as needed for pain.    2. UI: will continue vesicare 10 mg daily  3. 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 Anticoagulation management: for her inr of 2.68 will continue coumadin 5 mg daily and will check inr in 2 weeks.    Time spent with patient 50 minutes.

## 2013-09-05 NOTE — Progress Notes (Signed)
Quick Note:  LMOM TCB x1. ______ 

## 2013-09-11 NOTE — Progress Notes (Signed)
Quick Note:  Called spoke with patient, advised of cxr results / recs as stated by TP. Pt verbalized her understanding and denied any questions. ______ 

## 2013-09-18 NOTE — Addendum Note (Signed)
Addended by: Boone Master E on: 09/18/2013 01:20 PM   Modules accepted: Orders

## 2013-09-19 ENCOUNTER — Ambulatory Visit: Payer: Medicaid Other | Admitting: Internal Medicine

## 2013-10-07 ENCOUNTER — Encounter: Payer: Self-pay | Admitting: Internal Medicine

## 2013-10-07 ENCOUNTER — Ambulatory Visit (INDEPENDENT_AMBULATORY_CARE_PROVIDER_SITE_OTHER): Payer: PRIVATE HEALTH INSURANCE | Admitting: Internal Medicine

## 2013-10-07 VITALS — BP 130/70 | HR 66 | Temp 97.9°F | Ht 61.0 in | Wt 176.4 lb

## 2013-10-07 DIAGNOSIS — R05 Cough: Secondary | ICD-10-CM

## 2013-10-07 NOTE — Assessment & Plan Note (Addendum)
-   Sinus CT 01/02/13 1. Negative paranasal sinuses. 2. Evidence of remote right mastoid inflammation. 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   I had an extended discussion with the patient today lasting 15 to 20 minutes of a 25 minute visit on the following issues:    Each maintenance medication was reviewed in detail including most importantly the difference between maintenance and as needed and under what circumstances the prns are to be used. This was done in the context of a medication calendar review which provided the patient with a user-friendly unambiguous mechanism for medication administration and reconciliation and provides an action plan for all active problems. It is critical that this be shown to every doctor  for modification during the office visit if necessary so the patient can use it as a working document.

## 2013-10-07 NOTE — Progress Notes (Signed)
Subjective:    Patient ID: Gloria Patterson, female    DOB: July 06, 1951   MRN: [id number]   Brief patient profile:  63 yobf never smoker  dx with asthma 1980's= wheeze and sob and required daily inhalers until around 2000 when stopped taking IV drugs and onset of cough after stopped drugs daily since and worse since 2011 so referred [facility] 10/16/2012 to pulmonary clinic for cough.  HPI 10/16/2012 1st pulmonary eval cc daily cough x 13 years worse x 2 years daily x year round prod min mucus white better on cough suppression, inhalers with proventil and qvar > no better.  Night when lie down worse and has to sit up eventually goes away and sleeps ok without am exac.  Did best with prednisone x 2 weeks after  Using cough drops and having food sticks in throat rec Stop qvar and just yellow inhaler proventil if needed > did not need any Prednisone 10 mg take  4 each am x 2 days,   2 each am x 2 days,  1 each am x2days and stop  Try dexilant 60 mg   Take one 30-60 min before first meal of the day and Pepcid 20 mg one bedtime until return GERD diet  10/30/2012 f/u ov/Wert cc completely better p prednisone then cough recurred about half as severe the day after prednisone completed, but no use of inhalers at all since last ov and no excess mucus produciton.  rec Omeprazole  20mg   Take 30-60 min before first meal of the day and Pepcid 20 mg one bedtime until  Return Prednisone 10 mg take  4 each am x 2 days,   2 each am x 2 days,  1 each am x2days and stop  GERD diet   12/05/2012 f/u ov/Wert cc much better to her satisfaction but Still with urge to clear throat however.  rec Sinus ct > neg Allergy profile > declined  01/16/2013 f/u ov/Wert cc cough recurred when ran out of meds because she couldn't afford them, not clear which meds helped the most. No sob rec Take delsym two tsp every 12 hours and supplement if needed with  tramadol 50 mg up to 2 every 4 hours to suppress the urge to  cough. Swallowing water or using ice chips/non mint and menthol containing candies (such as lifesavers or sugarless jolly ranchers) are also effective.  You should rest your voice and avoid activities that you know make you cough. Once you have eliminated the cough for 3 straight days try reducing the tramadol first,  then the delsym as tolerated.   Resume omeprazole 20 mg Take 30-60 min before first meal of the day and pepcid 20 mg one at bedtime GERD  03/06/2013 f/u ov/Wert cc cough worse during day, dry, hacking, could not take tramadol, prednisone seemed to help rec Take delsym two tsp every 12 hours every 12 hours Neurontin 100mg  three time a day  Resume prilosec 20 mg Take 30-60 min before first meal of the day and pepcid 20 mg one at bedtime Prednisone 10 mg take  4 each am x 2 days,   2 each am x 2 days,  1 each am x2days and stop  GERD (REFLUX) diet  Please schedule a follow up office visit in 4 weeks, sooner if needed with all active active medications in your hand > did not do  08/25/2013 f/u ov/Wert confused with meds Chief Complaint  Patient presents with  . Follow-up  Pt states her cough has improved some since last visit, but is still present- mainly non prod but sometimes produces minimal yellow sputum. Clearing her throat alot. Using ventolin at least twice per day.   rec Take delsym two tsp every 12 hours every 12 hours as needed for cough or throat clearing  Clonidine 0.1 mg twice daily  neurontin 100mg  three times a day  Resume prilosec 20 mg Take 30-60 min before first meal of the day and pepcid 20 mg one at bedtime Prednisone 10 mg take  4 each am x 2 days,   2 each am x 2 days,  1 each am x2days and stop  GERD diet    09/03/2013 Follow up and med review   new med calendar - pt brought all meds with her today.  Reviewed all her meds and organized them into med calendar with pt education.  Unclear if she is taking correctly, daughter helps her with her meds and she  does not know the names well.   does report some increased cough x6 days with small amount of light yellow mucus, wheezing, increased SOB. Says her cough got much better on prednsione but came back when she finished steroid dose.   rec Augmentin 875mg  Twice daily  For days  Prednisone taper over next week.  Follow med calendar closely and bring to each visit.   10/07/2013 f/u ov/Wert re: chronic cough x 10 years / did not bring med calendar  Chief Complaint  Patient presents with  . Follow-up    Pt states that her cough is some better since last visit. Cough only bothers her in the am  now and is non prod. No new co's today.         Cough the best it's been in 10 years  No obvious day to day or daytime variabilty or assoc sob  or cp or chest tightness, subjective wheeze overt sinus or hb symptoms. No unusual exp hx or h/o childhood pna/ asthma or knowledge of premature birth.  Sleeping ok without nocturnal  or early am exacerbation  of respiratory  c/o's or need for noct saba. Also denies any obvious fluctuation of symptoms with weather or environmental changes or other aggravating or alleviating factors except as outlined above   Current Medications, Allergies, Complete Past Medical History, Past Surgical History, Family History, and Social History were reviewed in [employer] record.  ROS  The following are not active complaints unless bolded sore throat, dysphagia, dental problems, itching, sneezing,  nasal congestion or excess/ purulent secretions, ear ache,   fever, chills, sweats, unintended wt loss, pleuritic or exertional cp, hemoptysis,  orthopnea pnd or leg swelling, presyncope, palpitations, heartburn, abdominal pain, anorexia, nausea, vomiting, diarrhea  or change in bowel or urinary habits, change in stools or urine, dysuria,hematuria,  rash, arthralgias, visual complaints, headache, numbness weakness or ataxia or problems with walking or coordination,   change in mood/affect or memory.                 Objective:   Physical Exam  edentuous amb bf nad with min pseudowheeze on insp only      Wt 193 10/16/2012  > 192 10/30/2012 > 193 12/05/2012 > 01/15/2013  194 > 03/06/2013  195 > 08/25/2013 189> 176 10/07/2013    HEENT: edentulous  turbinates, and orophanx. Nl external ear canals without cough reflex   NECK :  without JVD/Nodes/TM/ nl carotid upstrokes bilaterally   LUNGS: no acc  muscle use, clear to A and P bilaterally without cough on insp or exp maneuvers +psuedowheezing   CV:  RRR  no s3 or murmur or increase in P2, no edema   ABD:  soft and nontender with nl excursion in the supine position. No bruits or organomegaly, bowel sounds nl  MS:  warm without deformities, calf tenderness, cyanosis or clubbing      cxr  09/11/13 No acute disease.   Assessment & Plan:

## 2013-10-07 NOTE — Patient Instructions (Addendum)
See calendar for specific medication instructions and bring it back for each and every office visit for every healthcare provider you see.  Without it,  you may not receive the best quality medical care that we feel you deserve.  You will note that the calendar groups together  your maintenance  medications that are timed at particular times of the day.  Think of this as your checklist for what your doctor has instructed you to do until your next evaluation to see what benefit  there is  to staying on a consistent group of medications intended to keep you well.  The other group at the bottom is entirely up to you to use as you see fit  for specific symptoms that may arise between visits that require you to treat them on an as needed basis.  Think of this as your action plan or "what if" list.   Separating the top medications from the bottom group is fundamental to providing you adequate care going forward.    Please schedule a follow up office visit in 6 weeks, call sooner if needed bring bottles and your med calendar with you

## 2013-10-16 ENCOUNTER — Ambulatory Visit: Payer: Medicaid Other | Admitting: Internal Medicine

## 2013-11-18 ENCOUNTER — Ambulatory Visit: Payer: Medicare Other | Admitting: Internal Medicine

## 2013-11-24 ENCOUNTER — Other Ambulatory Visit: Payer: Self-pay | Admitting: Internal Medicine

## 2013-11-25 ENCOUNTER — Encounter: Payer: Self-pay | Admitting: Internal Medicine

## 2013-11-25 ENCOUNTER — Ambulatory Visit (INDEPENDENT_AMBULATORY_CARE_PROVIDER_SITE_OTHER): Payer: PRIVATE HEALTH INSURANCE | Admitting: Internal Medicine

## 2013-11-25 VITALS — BP 126/78 | HR 70 | Temp 98.2°F | Ht 61.0 in | Wt 191.0 lb

## 2013-11-25 DIAGNOSIS — K219 Gastro-esophageal reflux disease without esophagitis: Secondary | ICD-10-CM

## 2013-11-25 DIAGNOSIS — R05 Cough: Secondary | ICD-10-CM

## 2013-11-25 DIAGNOSIS — R059 Cough, unspecified: Secondary | ICD-10-CM

## 2013-11-25 MED ORDER — OMEPRAZOLE 40 MG PO CPDR: 40.0000 mg | DELAYED_RELEASE_CAPSULE | Freq: Every day | ORAL | Status: DC

## 2013-11-25 NOTE — Patient Instructions (Signed)
Increase the omeprazole to 40 mg Take 30-60 min before first meal of the day and continue the pepcid 20 mg at bedtime   If you are satisfied with your treatment plan let your doctor know and he/she can either refill your medications or you can return here when your prescription runs out.     If in any way you are not 100% satisfied,  please tell us.  If 100% better, tell your friends!

## 2013-11-25 NOTE — Progress Notes (Signed)
Subjective:    Patient ID: Gloria Patterson, female    DOB: 06-Nov-1951   MRN: [id number]  Brief patient profile:  1 yobf never smoker  dx with asthma 1980's= wheeze and sob and required daily inhalers until around 2000 when stopped taking IV drugs and onset of cough after stopped drugs daily since and worse since 2011 so referred [facility] 10/16/2012 to pulmonary clinic for cough.    HPI 10/16/2012 1st pulmonary eval cc daily cough x 13 years worse x 2 years daily x year round prod min mucus white better on cough suppression, inhalers with proventil and qvar > no better.  Night when lie down worse and has to sit up eventually goes away and sleeps ok without am exac.  Did best with prednisone x 2 weeks after  Using cough drops and having food sticks in throat rec Stop qvar and just yellow inhaler proventil if needed > did not need any Prednisone 10 mg take  4 each am x 2 days,   2 each am x 2 days,  1 each am x2days and stop  Try dexilant 60 mg   Take one 30-60 min before first meal of the day and Pepcid 20 mg one bedtime until return GERD diet  10/30/2012 f/u ov/Mckinzie Saksa cc completely better p prednisone then cough recurred about half as severe the day after prednisone completed, but no use of inhalers at all since last ov and no excess mucus produciton.  rec Omeprazole  20mg   Take 30-60 min before first meal of the day and Pepcid 20 mg one bedtime until  Return Prednisone 10 mg take  4 each am x 2 days,   2 each am x 2 days,  1 each am x2days and stop  GERD diet   12/05/2012 f/u ov/Patience Nuzzo cc much better to her satisfaction but Still with urge to clear throat however. rec Sinus ct > neg Allergy profile > declined  01/16/2013 f/u ov/Aida Lemaire cc cough recurred when ran out of meds because she couldn't afford them, not clear which meds helped the most. No sob rec Take delsym two tsp every 12 hours and supplement if needed with  tramadol 50 mg up to 2 every 4 hours to suppress the urge to  cough. Swallowing water or using ice chips/non mint and menthol containing candies (such as lifesavers or sugarless jolly ranchers) are also effective.  You should rest your voice and avoid activities that you know make you cough. Once you have eliminated the cough for 3 straight days try reducing the tramadol first,  then the delsym as tolerated.   Resume omeprazole 20 mg Take 30-60 min before first meal of the day and pepcid 20 mg one at bedtime GERD  03/06/2013 f/u ov/Lewis Grivas cc cough worse during day, dry, hacking, could not take tramadol, prednisone seemed to help rec Take delsym two tsp every 12 hours every 12 hours Neurontin 100mg  three time a day  Resume prilosec 20 mg Take 30-60 min before first meal of the day and pepcid 20 mg one at bedtime Prednisone 10 mg take  4 each am x 2 days,   2 each am x 2 days,  1 each am x2days and stop  GERD (REFLUX) diet  Please schedule a follow up office visit in 4 weeks, sooner if needed with all active active medications in your hand > did not do  08/25/2013 f/u ov/Daleigh Pollinger confused with meds Chief Complaint  Patient presents with  . Follow-up  Pt states her cough has improved some since last visit, but is still present- mainly non prod but sometimes produces minimal yellow sputum. Clearing her throat alot. Using ventolin at least twice per day.   rec Take delsym two tsp every 12 hours every 12 hours as needed for cough or throat clearing  Clonidine 0.1 mg twice daily  neurontin 100mg  three times a day  Resume prilosec 20 mg Take 30-60 min before first meal of the day and pepcid 20 mg one at bedtime Prednisone 10 mg take  4 each am x 2 days,   2 each am x 2 days,  1 each am x2days and stop  GERD diet    09/03/2013 Follow up and med review   new med calendar - pt brought all meds with her today.  Reviewed all her meds and organized them into med calendar with pt education.  Unclear if she is taking correctly, daughter helps her with her meds and she  does not know the names well.   does report some increased cough x6 days with small amount of light yellow mucus, wheezing, increased SOB. Says her cough got much better on prednsione but came back when she finished steroid dose.   rec Augmentin 875mg  Twice daily  For days  Prednisone taper over next week.  Follow med calendar closely and bring to each visit.   10/07/2013 f/u ov/Demondre Aguas re: chronic cough x 10 years / did not bring med calendar  Chief Complaint  Patient presents with  . Follow-up    Pt states that her cough is some better since last visit. Cough only bothers her in the am  now and is non prod. No new co's today.     Cough the best it's been in 10 years rec See med cal/ no change rx/ bring med cal to ov   11/25/2013 f/u ov/Nakenya Theall re: chronic cough x 10 years/ still no med calendar Chief Complaint  Patient presents with  . Follow-up    Pt states cough continues to improve. She is still only coughing in the am- prod with clear sputum.    not needing saba at all   No obvious day to day or daytime variabilty or assoc sob  or cp or chest tightness, subjective wheeze overt sinus or hb symptoms. No unusual exp hx or h/o childhood pna/ asthma or knowledge of premature birth.  Sleeping ok without nocturnal  or early am exacerbation  of respiratory  c/o's or need for noct saba. Also denies any obvious fluctuation of symptoms with weather or environmental changes or other aggravating or alleviating factors except as outlined above   Current Medications, Allergies, Complete Past Medical History, Past Surgical History, Family History, and Social History were reviewed in [employer] record.  ROS  The following are not active complaints unless bolded sore throat, dysphagia, dental problems, itching, sneezing,  nasal congestion or excess/ purulent secretions, ear ache,   fever, chills, sweats, unintended wt loss, pleuritic or exertional cp, hemoptysis,  orthopnea pnd  or leg swelling, presyncope, palpitations, heartburn, abdominal pain, anorexia, nausea, vomiting, diarrhea  or change in bowel or urinary habits, change in stools or urine, dysuria,hematuria,  rash, arthralgias, visual complaints, headache, numbness weakness or ataxia or problems with walking or coordination,  change in mood/affect or memory.                 Objective:   Physical Exam  edentuous amb slt hoarse obese  bf nad  with min pseudowheeze on insp only      Wt 193 10/16/2012  > 192 10/30/2012 > 193 12/05/2012 > 01/15/2013  194 > 03/06/2013  195 > 08/25/2013 189> 176 10/07/2013 > 11/25/2013 191    HEENT: edentulous  turbinates, and orophanx. Nl external ear canals without cough reflex   NECK :  without JVD/Nodes/TM/ nl carotid upstrokes bilaterally   LUNGS: no acc muscle use, clear to A and P bilaterally without cough on insp or exp maneuvers + trace/very min pseudowheezing    CV:  RRR  no s3 or murmur or increase in P2, no edema   ABD:  soft and nontender with nl excursion in the supine position. No bruits or organomegaly, bowel sounds nl  MS:  warm without deformities, calf tenderness, cyanosis or clubbing      cxr  09/11/13 No acute disease.   Assessment & Plan:   Outpatient Encounter Prescriptions as of 11/25/2013  Medication Sig  . albuterol (PROVENTIL HFA) 108 (90 BASE) MCG/ACT inhaler Inhale 2 puffs into the lungs every 4 (four) hours as needed for wheezing or shortness of breath.   . cloNIDine (CATAPRES) 0.1 MG tablet Take 1 tablet (0.1 mg total) by mouth 2 (two) times daily.  Marland Kitchen dextromethorphan (DELSYM) 30 MG/5ML liquid 2 tsp twice daily as needed for cough  . famotidine (PEPCID) 20 MG tablet Take 1 tablet (20 mg total) by mouth every evening.  . gabapentin (NEURONTIN) 100 MG capsule Take 1 capsule (100 mg total) by mouth 3 (three) times daily.  . methadone (DOLOPHINE) 10 MG/5ML solution Take 78 mg by mouth daily.  Marland Kitchen omeprazole (PRILOSEC) 40 MG capsule Take 1  capsule (40 mg total) by mouth daily.  . [DISCONTINUED] docusate sodium 100 MG CAPS Take 100 mg by mouth 2 (two) times daily.  . [DISCONTINUED] methadone (DOLOPHINE) 10 MG tablet Take 10 mg by mouth daily. Receives at methadone clinic  . [DISCONTINUED] omeprazole (PRILOSEC) 20 MG capsule TAKE 1 CAPSULE (20 MG TOTAL) BY MOUTH EVERY MORNING.

## 2013-11-26 NOTE — Assessment & Plan Note (Signed)
Response to gerd rx off all asthma rx is typical of  Classic Upper airway cough syndrome, so named because it's frequently impossible to sort out how much is  CR/sinusitis with freq throat clearing (which can be related to primary GERD)   vs  causing  secondary (" extra esophageal")  GERD from wide swings in gastric pressure that occur with throat clearing, often  promoting self use of mint and menthol lozenges that reduce the lower esophageal sphincter tone and exacerbate the problem further in a cyclical fashion.   These are the same pts (now being labeled as having "irritable larynx syndrome" by some cough centers) who not infrequently have a history of having failed to tolerate ace inhibitors,  dry powder inhalers or biphosphonates or report having atypical reflux symptoms that don't respond to standard doses of PPI , and are easily confused as having aecopd or asthma flares by even experienced allergists/ pulmonologists.  rx max gerd rx/ pulmonary f/u prn

## 2013-11-26 NOTE — Assessment & Plan Note (Signed)
Change ppi to max omeprazole Take 30-60 min before first meal of the day and continue pepcid at hs plus diet  F/u per primary care

## 2013-12-09 ENCOUNTER — Other Ambulatory Visit: Payer: Self-pay | Admitting: Internal Medicine

## 2014-03-06 ENCOUNTER — Other Ambulatory Visit: Payer: Self-pay | Admitting: Internal Medicine

## 2014-04-01 ENCOUNTER — Other Ambulatory Visit: Payer: Self-pay | Admitting: Internal Medicine

## 2014-04-14 ENCOUNTER — Other Ambulatory Visit: Payer: Self-pay | Admitting: Internal Medicine

## 2014-05-04 ENCOUNTER — Other Ambulatory Visit: Payer: Self-pay | Admitting: Internal Medicine

## 2014-06-04 ENCOUNTER — Other Ambulatory Visit: Payer: Self-pay | Admitting: Internal Medicine

## 2014-06-28 ENCOUNTER — Other Ambulatory Visit: Payer: Self-pay | Admitting: Internal Medicine

## 2014-07-01 ENCOUNTER — Other Ambulatory Visit: Payer: Self-pay | Admitting: Internal Medicine

## 2014-07-02 ENCOUNTER — Other Ambulatory Visit: Payer: Self-pay | Admitting: Internal Medicine

## 2014-11-30 ENCOUNTER — Other Ambulatory Visit: Payer: Self-pay | Admitting: Internal Medicine

## 2017-05-04 ENCOUNTER — Ambulatory Visit (INDEPENDENT_AMBULATORY_CARE_PROVIDER_SITE_OTHER): Payer: Medicare Other | Admitting: Orthopedic Surgery

## 2017-05-04 ENCOUNTER — Ambulatory Visit (INDEPENDENT_AMBULATORY_CARE_PROVIDER_SITE_OTHER): Payer: Medicare Other

## 2017-05-04 ENCOUNTER — Encounter (INDEPENDENT_AMBULATORY_CARE_PROVIDER_SITE_OTHER): Payer: Self-pay | Admitting: Orthopedic Surgery

## 2017-05-04 DIAGNOSIS — M1711 Unilateral primary osteoarthritis, right knee: Secondary | ICD-10-CM

## 2017-05-04 NOTE — Progress Notes (Signed)
Office Visit Note   Patient: Gloria Patterson           Date of Birth: 23-Aug-1951           MRN: [id number] Visit Date: 05/04/2017 Requested by: Ananias PilgrimAsres, [place], MD [id number] [place], [place] [id number] PCP: System, Pcp Not In  Subjective: Chief Complaint  Patient presents with  . Right Knee - Pain    HPI: Gloria CellaDorothy is a [age] year old patient with right knee pain.  Her pain is severe and is interfering with her activities of daily living.  She describes pain for over a year with weakness and giving way.  Denies a history of injury.  States the pain is constant.  She will wake with the pain.  The pain is getting worse.  She is unable to get relief.  She is on methadone.  Last time she had her left knee replaced she was on pain medicine and hospital and went back on methadone postop.  She does have people who can look after her at home.  Does not have any stairs at her house.  No family history of DVT or pulmonary embolism.              ROS: All systems reviewed are negative as they relate to the chief complaint within the history of present illness.  Patient denies  fevers or chills.   Assessment & Plan: Visit Diagnoses:  1. Arthritis of right knee     Plan: Impression is right knee arthritis.  Plan right total knee replacement.  Risks and benefits discussed including but limited to infection or vessel damage incomplete pain relief.  Hemoglobin A1c 6% that's in good control.  We'll plan for perioperative pain management with opioids and then back to methadone when she goes to the nursing home.  Patient stands the risks and benefits.  All questions answered.  Follow-Up Instructions: No Follow-up on file.   Orders:  Orders Placed This Encounter  Procedures  . XR KNEE 3 VIEW RIGHT   No orders of the defined types were placed in this encounter.     Procedures: No procedures performed   Clinical Data: No additional findings.  Objective: Vital Signs: There  were no vitals taken for this visit.  Physical Exam:   Constitutional: Patient appears well-developed HEENT:  Head: Normocephalic Eyes:EOM are normal Neck: Normal range of motion Cardiovascular: Normal rate Pulmonary/chest: Effort normal Neurologic: Patient is alert Skin: Skin is warm Psychiatric: Patient has normal mood and affect    Ortho Exam: Orthopedic exam demonstrates that the patient in place with a cane.  Slight varus alignment right lower chrie.  Pedal pulses intact.  Ankle dorsi and plantar flexion is intact.  She has full extension to about 110 of flexion.  Has medial and lateral joint line tenderness along with patella femoral crepitus.  On the left knee she's got full extension to about 95 of flexion.  No effusion in that left knee trace effusion in the right knee.  No groin pain with internal/external rotation of that right leg.  Specialty Comments:  No specialty comments available.  Imaging: Xr Knee 3 View Right  Result Date: 05/04/2017 AP lateral right knee reviewed.  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  Slight varus alignment is present.  Total knee replacement and plate on the femur in good position on the left knee.  Posterior spurring present.    PMFS History: Patient Active Problem List  Diagnosis Date Noted  . HBP (high blood pressure) 08/25/2013  . GERD (gastroesophageal reflux disease) 05/23/2013  . Narcotic withdrawal (HCC) 05/23/2013  . Acute bronchitis 05/23/2013  . Osteoarthritis of left knee 04/08/2013    Class: Diagnosis of  . Upper airway cough syndrome 10/16/2012   Past Medical History:  Diagnosis Date  . Asthma    MILD  . Chronic cough   . Frequency of urination   . GERD (gastroesophageal reflux disease)   . 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 Nocturia   . Urgency of urination   . Vaginal vault prolapse    ANTERIOR    Family History  Problem Relation Age of Onset  . Prostate cancer Father   . Breast cancer Sister     Past Surgical History:  Procedure Laterality Date  . ANTERIOR AND POSTERIOR REPAIR N/A 03/10/2013   Procedure: ANTERIOR (CYSTOCELE) AND POSTERIOR REPAIR (RECTOCELE);  Surgeon: Kathi Ludwig, MD;  Location: [facility];  Service: Urology;  Laterality: N/A;   [employer] SCIENTIFIC UPHOLD LITE ANTERIOR VAULT REPAIR  POSSIBLE OP WITH OBSERVATION POSSIBLE FLOOR BED  Uphold LITE w/Capio SLIM Catalog number [id number] qty 2 Xenform soft tissue repair matrix 6x7 cm [id number]   . BREAST SURGERY     B/L removal of benign cysts  . CYSTOCELE REPAIR N/A 03/10/2013   Procedure:  Anterior vaginal vault prolapse repair, with [employer] uphold light sacrospinous fixation using U. device and mesh repair with [name] plication   ;  Surgeon: Kathi Ludwig, MD;  Location: [facility];  Service: Urology;  Laterality: N/A;  . TOTAL KNEE ARTHROPLASTY Left 04/08/2013   Procedure: TOTAL KNEE ARTHROPLASTY;  Surgeon: Cammy Copa, MD;  Location: [facility] OR;  Service: Orthopedics;  Laterality: Left;  Left total knee arthroplasty  . TUBAL LIGATION     Social History   Occupational History  . Not on file.   Social History Main Topics  . Smoking status: Never Smoker  . Smokeless tobacco: Never Used     Comment: CANNOT HAVE NARCOTIC MEDICATIONS PER PT  . Alcohol use No  . Drug use: No     Comment: RECOVERING HEROIN & COCAINE ADDICT  ( QUIT 2011)  . Sexual activity: No

## 2017-05-11 ENCOUNTER — Other Ambulatory Visit (INDEPENDENT_AMBULATORY_CARE_PROVIDER_SITE_OTHER): Payer: Self-pay | Admitting: Orthopedic Surgery

## 2017-05-11 ENCOUNTER — Telehealth (INDEPENDENT_AMBULATORY_CARE_PROVIDER_SITE_OTHER): Payer: Self-pay

## 2017-05-11 DIAGNOSIS — M1711 Unilateral primary osteoarthritis, right knee: Secondary | ICD-10-CM

## 2017-05-11 NOTE — Telephone Encounter (Signed)
pls advise. thx  

## 2017-05-11 NOTE — Telephone Encounter (Signed)
Patient would like a Rx for pain for right knee.  CB# [phone].

## 2017-05-12 NOTE — Telephone Encounter (Signed)
I think she is on pain clinic meds pls check thx

## 2017-05-16 MED ORDER — TRAMADOL HCL 50 MG PO TABS: 50.0000 mg | ORAL_TABLET | Freq: Two times a day (BID) | ORAL | 0 refills | Status: DC

## 2017-05-16 NOTE — Addendum Note (Signed)
Addended byCherre Huger: Jamere Stidham on: 05/16/2017 05:48 PM   Modules accepted: Orders

## 2017-05-16 NOTE — Telephone Encounter (Signed)
IC patient and advised, IC pharm LMVM with Rx.  

## 2017-05-16 NOTE — Telephone Encounter (Signed)
Ok to rf tramadol 1 po q 8 # 45 pls clal thx

## 2017-05-25 ENCOUNTER — Encounter (HOSPITAL_COMMUNITY): Payer: Self-pay

## 2017-05-25 ENCOUNTER — Encounter (HOSPITAL_COMMUNITY)
Admission: RE | Admit: 2017-05-25 | Discharge: 2017-05-25 | Disposition: A | Discharge status: Home / Self Care | Payer: Medicare Other | Source: Ambulatory Visit | Attending: Orthopedic Surgery | Admitting: Orthopedic Surgery

## 2017-05-25 ENCOUNTER — Ambulatory Visit (HOSPITAL_COMMUNITY)
Admission: RE | Admit: 2017-05-25 | Discharge: 2017-05-25 | Disposition: A | Discharge status: Home / Self Care | Payer: Medicare Other | Source: Ambulatory Visit | Attending: Orthopedic Surgery | Admitting: Orthopedic Surgery

## 2017-05-25 DIAGNOSIS — Z419 Encounter for procedure for purposes other than remedying health state, unspecified: Secondary | ICD-10-CM | POA: Diagnosis present

## 2017-05-25 DIAGNOSIS — R9431 Abnormal electrocardiogram [ECG] [EKG]: Secondary | ICD-10-CM | POA: Diagnosis not present

## 2017-05-25 DIAGNOSIS — M1711 Unilateral primary osteoarthritis, right knee: Secondary | ICD-10-CM | POA: Insufficient documentation

## 2017-05-25 HISTORY — DX: Essential (primary) hypertension: I10

## 2017-05-25 HISTORY — DX: Unspecified osteoarthritis, unspecified site: M19.90

## 2017-05-25 HISTORY — DX: Constipation, unspecified: K59.00

## 2017-05-25 HISTORY — DX: Type 2 diabetes mellitus without complications: E11.9

## 2017-05-25 HISTORY — DX: Inflammatory liver disease, unspecified: K75.9

## 2017-05-25 HISTORY — DX: Personal history of urinary calculi: Z87.442

## 2017-05-25 HISTORY — DX: Unspecified convulsions: R56.9

## 2017-05-25 LAB — COMPREHENSIVE METABOLIC PANEL
ALK PHOS: 86 U/L (ref 38–126)
ALT: 40 U/L (ref 14–54)
AST: 53 U/L — AB (ref 15–41)
Albumin: 3.8 g/dL (ref 3.5–5.0)
Anion gap: 7 (ref 5–15)
BILIRUBIN TOTAL: 0.8 mg/dL (ref 0.3–1.2)
BUN: 7 mg/dL (ref 6–20)
CO2: 28 mmol/L (ref 22–32)
CREATININE: 0.72 mg/dL (ref 0.44–1.00)
Calcium: 9.5 mg/dL (ref 8.9–10.3)
Chloride: 105 mmol/L (ref 101–111)
GFR calc Af Amer: >60 mL/min (ref >60)
Glucose, Bld: 93 mg/dL (ref 65–99)
Potassium: 3.9 mmol/L (ref 3.5–5.1)
Sodium: 140 mmol/L (ref 135–145)
TOTAL PROTEIN: 7.6 g/dL (ref 6.5–8.1)

## 2017-05-25 LAB — CBC
HEMATOCRIT: 41.2 % (ref 36.0–46.0)
Hemoglobin: 13.4 g/dL (ref 12.0–15.0)
MCH: 30.5 pg (ref 26.0–34.0)
MCHC: 32.5 g/dL (ref 30.0–36.0)
MCV: 93.6 fL (ref 78.0–100.0)
Platelets: 244 10*3/uL (ref 150–400)
RBC: 4.4 MIL/uL (ref 3.87–5.11)
RDW: 13.5 % (ref 11.5–15.5)
WBC: 8.9 10*3/uL (ref 4.0–10.5)

## 2017-05-25 LAB — URINALYSIS, ROUTINE W REFLEX MICROSCOPIC
Bilirubin Urine: NEGATIVE
GLUCOSE, UA: NEGATIVE mg/dL
Hgb urine dipstick: NEGATIVE
KETONES UR: 5 mg/dL — AB
NITRITE: NEGATIVE
PH: 5 (ref 5.0–8.0)
Protein, ur: NEGATIVE mg/dL
Specific Gravity, Urine: 1.025 (ref 1.005–1.030)

## 2017-05-25 LAB — GLUCOSE, CAPILLARY: Glucose-Capillary: 115 mg/dL — ABNORMAL HIGH (ref 65–99)

## 2017-05-25 LAB — SURGICAL PCR SCREEN
MRSA, PCR: NEGATIVE
Staphylococcus aureus: NEGATIVE

## 2017-05-25 NOTE — Pre-Procedure Instructions (Signed)
Gloria HeadsDorothy J Patterson  05/25/2017     Your procedure is scheduled on Thursday, June 14.  [facility] at 10:30 AM               Your surgery or procedure is scheduled for 12:30 PM    Call this number if you have problems the morning of surgery: [phone]                For any other questions, please call [phone], Monday - Friday 8 AM - 4 PM.    Remember:  Do not eat food or drink liquids after midnight Wednesday, June 13  Take these medicines the morning of surgery with A SIP OF WATER: cloNIDine (CATAPRES), METHADONE, omeprazole (PRILOSEC). May use Abuterol Inhaler and bring it to the hospital with you.                  May take Tramadol if needed.                DO NOT take Metformin the morning of surgery.            STOP taking Aspirin, Aspirin Products (Goody Powder, Excedrin Migraine), Ibuprofen (Advil), Naproxen (Aleve),meloxicam (MOBIC, Vitamins and Herbal Products (ie Fish Oil)  How to Manage Your Diabetes Before and After Surgery  Why is it important to control my blood sugar before and after surgery? . Improving blood sugar levels before and after surgery helps healing and can limit problems. . A way of improving blood sugar control is eating a healthy diet by: o  Eating less sugar and carbohydrates o  Increasing activity/exercise o  Talking with your doctor about reaching your blood sugar goals . High blood sugars (greater than 180 mg/dL) can raise your risk of infections and slow your recovery, so you will need to focus on controlling your diabetes during the weeks before surgery. . Make sure that the doctor who takes care of your diabetes knows about your planned surgery including the date and location.  How do I manage my blood sugar before surgery? . Check your blood sugar at least 4 times a day, starting 2 days before surgery, to make sure that the level is not too high or low. o Check your blood sugar the morning of your  surgery when you wake up and every 2 hours until you get to the Short Stay unit. . If your blood sugar is less than 70 mg/dL, you will need to treat for low blood sugar: o Do not take insulin. o Treat a low blood sugar (less than 70 mg/dL) with  cup of clear juice (cranberry or apple), 4 glucose tablets, OR glucose gel. o Recheck blood sugar in 15 minutes after treatment (to make sure it is greater than 70 mg/dL). If your blood sugar is not greater than 70 mg/dL on recheck, call [phone] for further instructions. . Report your blood sugar to the short stay nurse when you get to Short Stay.  . If you are admitted to the hospital after surgery: o Your blood sugar will be checked by the staff and you will probably be given insulin after surgery (instead of oral diabetes medicines) to make sure you have good blood sugar levels. o The goal for blood sugar control after surgery is 80-180 mg/dL.  WHAT DO I DO ABOUT MY DIABETES MEDICATION?  Marland Kitchen. Do not take oral diabetes medicines (pills) the morning of surgery.      .Marland Kitchen  The day of surgery, do not take other diabetes injectables, including Byetta (exenatide), Bydureon (exenatide ER), Victoza (liraglutide), or Trulicity (dulaglutide).   Patient Signature:  Date:   Nurse Signature:  Date:     Do not wear jewelry, make-up or nail polish.  Do not wear lotions, powders, or perfumes, or deodorant.  Do not shave 48 hours prior to surgery.  Men may shave face and neck.  Do not bring valuables to the hospital.  [facility] is not responsible for any belongings or valuables.  Contacts, dentures or bridgework may not be worn into surgery.  Leave your suitcase in the car.  After surgery it may be brought to your room.  For patients admitted to the hospital, discharge time will be determined by your treatment team.    Special instructions:  Review  [place] - Preparing For Surgery.  Please read over the following fact sheets that you were  given: [facility]- Preparing For Surgery and Patient Instructions for Mupirocin Application, Incentive Spirometry, Pain Booklet, Surgical Site Infections

## 2017-05-25 NOTE — Progress Notes (Signed)
  [place]- Preparing For Surgery  Before surgery, you can play an important role. Because skin is not sterile, your skin needs to be as free of germs as possible. You can reduce the number of germs on your skin by washing with CHG (chlorahexidine gluconate) Soap before surgery.  CHG is an antiseptic cleaner which kills germs and bonds with the skin to continue killing germs even after washing.  Please do not use if you have an allergy to CHG or antibacterial soaps. If your skin becomes reddened/irritated stop using the CHG.  Do not shave (including legs and underarms) for at least 48 hours prior to first CHG shower. It is OK to shave your face.  Please follow these instructions carefully.   1. Shower the NIGHT BEFORE SURGERY and the MORNING OF SURGERY with CHG.   2. If you chose to wash your hair, wash your hair first as usual with your normal shampoo.  3. After you shampoo, rinse your hair and body thoroughly to remove the shampoo. Wash your face and private area with your noraml soap  4. Use CHG as you would any other liquid soap. You can apply CHG directly to the skin and wash gently with a scrungie or a clean washcloth.   5. Apply the CHG Soap to your body ONLY FROM THE NECK DOWN.  Do not use on open wounds or open sores. Avoid contact with your eyes, ears, mouth and genitals (private parts). Wash genitals (private parts) with your normal soap.  6. Wash thoroughly, paying special attention to the area where your surgery will be performed.  7. Thoroughly rinse your body with warm water from the neck down.  8. DO NOT shower/wash with your normal soap after using and rinsing off the CHG Soap.  9. Pat yourself dry with a CLEAN TOWEL.   10. Wear CLEAN PAJAMAS   11. Place CLEAN SHEETS on your bed the night of your first shower and DO NOT SLEEP WITH PETS.  Day of Surgery: Do not apply any deodorants/lotions. Please wear clean clothes to the hospital/surgery center.

## 2017-05-26 LAB — URINE CULTURE: Culture: <10000 — AB

## 2017-05-26 LAB — HEMOGLOBIN A1C
Hgb A1c MFr Bld: 6.4 % — ABNORMAL HIGH (ref 4.8–5.6)
Mean Plasma Glucose: 137 mg/dL

## 2017-05-30 MED ORDER — CEFAZOLIN SODIUM-DEXTROSE 2-4 GM/100ML-% IV SOLN
2.0000 g | INTRAVENOUS | Status: AC
  Administered 2017-05-31: 2 g via INTRAVENOUS
  Filled 2017-05-30: qty 100

## 2017-05-30 NOTE — Progress Notes (Signed)
Dr Delman KittenAries office [phone] called for EKG tracing, Info given with telephone number and fax number.

## 2017-05-31 ENCOUNTER — Inpatient Hospital Stay (HOSPITAL_COMMUNITY)
Admission: AD | Admit: 2017-05-31 | Discharge: 2017-06-02 | DRG: 470 | Disposition: A | Discharge status: Skilled Nursing Facility | Payer: Medicare Other | Source: Ambulatory Visit | Attending: Orthopedic Surgery | Admitting: Orthopedic Surgery

## 2017-05-31 ENCOUNTER — Encounter (HOSPITAL_COMMUNITY)
Admission: AD | Disposition: A | Discharge status: Skilled Nursing Facility | Payer: Self-pay | Source: Ambulatory Visit | Attending: Orthopedic Surgery

## 2017-05-31 ENCOUNTER — Encounter (HOSPITAL_COMMUNITY): Payer: Self-pay | Admitting: *Deleted

## 2017-05-31 ENCOUNTER — Inpatient Hospital Stay (HOSPITAL_COMMUNITY): Payer: Medicare Other | Admitting: Anesthesiology

## 2017-05-31 DIAGNOSIS — I10 Essential (primary) hypertension: Secondary | ICD-10-CM | POA: Diagnosis present

## 2017-05-31 DIAGNOSIS — Z888 Allergy status to other drugs, medicaments and biological substances status: Secondary | ICD-10-CM | POA: Diagnosis not present

## 2017-05-31 DIAGNOSIS — F1123 Opioid dependence with withdrawal: Secondary | ICD-10-CM | POA: Diagnosis not present

## 2017-05-31 DIAGNOSIS — Z7984 Long term (current) use of oral hypoglycemic drugs: Secondary | ICD-10-CM | POA: Diagnosis not present

## 2017-05-31 DIAGNOSIS — Z96652 Presence of left artificial knee joint: Secondary | ICD-10-CM | POA: Diagnosis present

## 2017-05-31 DIAGNOSIS — E119 Type 2 diabetes mellitus without complications: Secondary | ICD-10-CM | POA: Diagnosis present

## 2017-05-31 DIAGNOSIS — K219 Gastro-esophageal reflux disease without esophagitis: Secondary | ICD-10-CM | POA: Diagnosis present

## 2017-05-31 DIAGNOSIS — M19019 Primary osteoarthritis, unspecified shoulder: Secondary | ICD-10-CM | POA: Diagnosis present

## 2017-05-31 DIAGNOSIS — Z79899 Other long term (current) drug therapy: Secondary | ICD-10-CM

## 2017-05-31 DIAGNOSIS — J189 Pneumonia, unspecified organism: Secondary | ICD-10-CM | POA: Diagnosis not present

## 2017-05-31 DIAGNOSIS — M1711 Unilateral primary osteoarthritis, right knee: Secondary | ICD-10-CM | POA: Diagnosis present

## 2017-05-31 DIAGNOSIS — J45909 Unspecified asthma, uncomplicated: Secondary | ICD-10-CM | POA: Diagnosis present

## 2017-05-31 DIAGNOSIS — M1710 Unilateral primary osteoarthritis, unspecified knee: Secondary | ICD-10-CM | POA: Diagnosis present

## 2017-05-31 HISTORY — PX: TOTAL KNEE ARTHROPLASTY: SHX125

## 2017-05-31 LAB — GLUCOSE, CAPILLARY
GLUCOSE-CAPILLARY: 86 mg/dL (ref 65–99)
GLUCOSE-CAPILLARY: 123 mg/dL — AB (ref 65–99)
Glucose-Capillary: 101 mg/dL — ABNORMAL HIGH (ref 65–99)
Glucose-Capillary: 62 mg/dL — ABNORMAL LOW (ref 65–99)

## 2017-05-31 SURGERY — ARTHROPLASTY, KNEE, TOTAL
Anesthesia: Monitor Anesthesia Care | Site: Knee | Laterality: Right

## 2017-05-31 MED ORDER — BUPIVACAINE HCL (PF) 0.25 % IJ SOLN
INTRAMUSCULAR | Status: DC | PRN
Start: 2017-05-31 — End: 2017-05-31
  Administered 2017-05-31: 30 mL

## 2017-05-31 MED ORDER — DOCUSATE SODIUM 100 MG PO CAPS
100.0000 mg | ORAL_CAPSULE | Freq: Two times a day (BID) | ORAL | Status: DC
  Administered 2017-05-31 – 2017-06-02 (×4): 100 mg via ORAL
  Filled 2017-05-31 (×4): qty 1

## 2017-05-31 MED ORDER — CLONIDINE HCL 0.1 MG PO TABS
0.1000 mg | ORAL_TABLET | Freq: Two times a day (BID) | ORAL | Status: DC
  Administered 2017-05-31 – 2017-06-02 (×4): 0.1 mg via ORAL
  Filled 2017-05-31 (×4): qty 1

## 2017-05-31 MED ORDER — SODIUM CHLORIDE 0.9% FLUSH
INTRAVENOUS | Status: DC | PRN
  Administered 2017-05-31: 20 mL

## 2017-05-31 MED ORDER — ATORVASTATIN CALCIUM 20 MG PO TABS
20.0000 mg | ORAL_TABLET | Freq: Every day | ORAL | Status: DC
  Administered 2017-05-31 – 2017-06-01 (×2): 20 mg via ORAL
  Filled 2017-05-31 (×2): qty 1

## 2017-05-31 MED ORDER — METHOCARBAMOL 1000 MG/10ML IJ SOLN
500.0000 mg | Freq: Four times a day (QID) | INTRAVENOUS | Status: DC | PRN
  Filled 2017-05-31: qty 5

## 2017-05-31 MED ORDER — HYDROMORPHONE HCL 1 MG/ML IJ SOLN
1.0000 mg | INTRAMUSCULAR | Status: DC | PRN
  Administered 2017-05-31 – 2017-06-02 (×4): 1 mg via INTRAVENOUS
  Filled 2017-05-31 (×4): qty 1

## 2017-05-31 MED ORDER — PROPOFOL 500 MG/50ML IV EMUL
INTRAVENOUS | Status: AC
  Filled 2017-05-31: qty 100

## 2017-05-31 MED ORDER — ARTIFICIAL TEARS OPHTHALMIC OINT
TOPICAL_OINTMENT | OPHTHALMIC | Status: AC
  Filled 2017-05-31: qty 3.5

## 2017-05-31 MED ORDER — BUPIVACAINE LIPOSOME 1.3 % IJ SUSP
20.0000 mL | INTRAMUSCULAR | Status: AC
  Administered 2017-05-31: 20 mL
  Filled 2017-05-31: qty 20

## 2017-05-31 MED ORDER — BUPIVACAINE-EPINEPHRINE (PF) 0.25% -1:200000 IJ SOLN
INTRAMUSCULAR | Status: AC
  Filled 2017-05-31: qty 30

## 2017-05-31 MED ORDER — PROPOFOL 10 MG/ML IV BOLUS
INTRAVENOUS | Status: AC
  Filled 2017-05-31: qty 20

## 2017-05-31 MED ORDER — CHLORHEXIDINE GLUCONATE 4 % EX LIQD: 60.0000 mL | Freq: Once | CUTANEOUS | Status: DC

## 2017-05-31 MED ORDER — CLONIDINE HCL (ANALGESIA) 100 MCG/ML EP SOLN
150.0000 ug | Freq: Once | EPIDURAL | Status: AC
  Administered 2017-05-31: 100 ug via INTRA_ARTICULAR
  Filled 2017-05-31: qty 1.5

## 2017-05-31 MED ORDER — METFORMIN HCL 500 MG PO TABS
500.0000 mg | ORAL_TABLET | Freq: Every day | ORAL | Status: DC
  Administered 2017-06-01 – 2017-06-02 (×2): 500 mg via ORAL
  Filled 2017-05-31 (×2): qty 1

## 2017-05-31 MED ORDER — RIVAROXABAN 10 MG PO TABS
10.0000 mg | ORAL_TABLET | Freq: Every day | ORAL | Status: DC
  Administered 2017-06-01 – 2017-06-02 (×2): 10 mg via ORAL
  Filled 2017-05-31 (×2): qty 1

## 2017-05-31 MED ORDER — KETOROLAC TROMETHAMINE 15 MG/ML IJ SOLN
15.0000 mg | Freq: Four times a day (QID) | INTRAMUSCULAR | Status: AC
  Administered 2017-06-01: 15 mg via INTRAVENOUS
  Filled 2017-05-31: qty 1

## 2017-05-31 MED ORDER — HYDROMORPHONE HCL 1 MG/ML IJ SOLN: 0.2500 mg | INTRAMUSCULAR | Status: DC | PRN

## 2017-05-31 MED ORDER — ALBUTEROL SULFATE (2.5 MG/3ML) 0.083% IN NEBU: 3.0000 mL | INHALATION_SOLUTION | RESPIRATORY_TRACT | Status: DC | PRN

## 2017-05-31 MED ORDER — CEFAZOLIN SODIUM-DEXTROSE 2-4 GM/100ML-% IV SOLN
2.0000 g | Freq: Four times a day (QID) | INTRAVENOUS | Status: AC
  Administered 2017-05-31 – 2017-06-01 (×2): 2 g via INTRAVENOUS
  Filled 2017-05-31 (×3): qty 100

## 2017-05-31 MED ORDER — FENTANYL CITRATE (PF) 100 MCG/2ML IJ SOLN
INTRAMUSCULAR | Status: AC
  Filled 2017-05-31: qty 2

## 2017-05-31 MED ORDER — LACTATED RINGERS IV SOLN
INTRAVENOUS | Status: DC
  Administered 2017-05-31: 12:00:00 via INTRAVENOUS

## 2017-05-31 MED ORDER — METOCLOPRAMIDE HCL 5 MG PO TABS: 5.0000 mg | ORAL_TABLET | Freq: Three times a day (TID) | ORAL | Status: DC | PRN

## 2017-05-31 MED ORDER — METHOCARBAMOL 500 MG PO TABS
500.0000 mg | ORAL_TABLET | Freq: Four times a day (QID) | ORAL | Status: DC | PRN
  Administered 2017-05-31 – 2017-06-02 (×5): 500 mg via ORAL
  Filled 2017-05-31 (×5): qty 1

## 2017-05-31 MED ORDER — PHENOL 1.4 % MT LIQD: 1.0000 | OROMUCOSAL | Status: DC | PRN

## 2017-05-31 MED ORDER — ACETAMINOPHEN 325 MG PO TABS
650.0000 mg | ORAL_TABLET | Freq: Four times a day (QID) | ORAL | Status: DC | PRN
  Administered 2017-05-31 – 2017-06-02 (×4): 650 mg via ORAL
  Filled 2017-05-31 (×4): qty 2

## 2017-05-31 MED ORDER — OXYCODONE HCL 5 MG PO TABS
5.0000 mg | ORAL_TABLET | ORAL | Status: DC | PRN
  Administered 2017-05-31 – 2017-06-02 (×9): 10 mg via ORAL
  Filled 2017-05-31 (×9): qty 2

## 2017-05-31 MED ORDER — EPHEDRINE 5 MG/ML INJ
INTRAVENOUS | Status: AC
  Filled 2017-05-31: qty 10

## 2017-05-31 MED ORDER — BUPIVACAINE IN DEXTROSE 0.75-8.25 % IT SOLN
INTRATHECAL | Status: DC | PRN
  Administered 2017-05-31: 1.8 mL via INTRATHECAL

## 2017-05-31 MED ORDER — BUPIVACAINE-EPINEPHRINE 0.25% -1:200000 IJ SOLN
INTRAMUSCULAR | Status: DC | PRN
  Administered 2017-05-31: 20 mL

## 2017-05-31 MED ORDER — MIDAZOLAM HCL 2 MG/2ML IJ SOLN
INTRAMUSCULAR | Status: AC
  Administered 2017-05-31: 2 mg via INTRAVENOUS
  Filled 2017-05-31: qty 2

## 2017-05-31 MED ORDER — ONDANSETRON HCL 4 MG PO TABS: 4.0000 mg | ORAL_TABLET | Freq: Four times a day (QID) | ORAL | Status: DC | PRN

## 2017-05-31 MED ORDER — MORPHINE SULFATE (PF) 4 MG/ML IV SOLN
INTRAVENOUS | Status: DC | PRN
  Administered 2017-05-31: 8 mg via INTRAMUSCULAR

## 2017-05-31 MED ORDER — ROPIVACAINE HCL 2 MG/ML IJ SOLN
14.0000 mL/h | INTRAMUSCULAR | Status: DC
  Administered 2017-06-01 (×2): 14 mL/h
  Filled 2017-05-31 (×7): qty 200

## 2017-05-31 MED ORDER — 0.9 % SODIUM CHLORIDE (POUR BTL) OPTIME
TOPICAL | Status: DC | PRN
  Administered 2017-05-31: 1000 mL

## 2017-05-31 MED ORDER — ROPIVACAINE HCL 5 MG/ML IJ SOLN
INTRAMUSCULAR | Status: DC | PRN
  Administered 2017-05-31: 20 mL via PERINEURAL

## 2017-05-31 MED ORDER — ROPIVACAINE HCL 2 MG/ML IJ SOLN: 14.0000 mL | INTRAMUSCULAR | Status: DC

## 2017-05-31 MED ORDER — ALPRAZOLAM 0.5 MG PO TABS: 0.5000 mg | ORAL_TABLET | Freq: Three times a day (TID) | ORAL | Status: DC | PRN

## 2017-05-31 MED ORDER — SODIUM CHLORIDE 0.9 % IR SOLN
Status: DC | PRN
  Administered 2017-05-31: 3000 mL

## 2017-05-31 MED ORDER — BUPIVACAINE HCL (PF) 0.25 % IJ SOLN
INTRAMUSCULAR | Status: AC
  Filled 2017-05-31: qty 30

## 2017-05-31 MED ORDER — GABAPENTIN 300 MG PO CAPS
300.0000 mg | ORAL_CAPSULE | Freq: Three times a day (TID) | ORAL | Status: DC
  Administered 2017-05-31 – 2017-06-02 (×7): 300 mg via ORAL
  Filled 2017-05-31 (×7): qty 1

## 2017-05-31 MED ORDER — PHENYLEPHRINE 40 MCG/ML (10ML) SYRINGE FOR IV PUSH (FOR BLOOD PRESSURE SUPPORT)
PREFILLED_SYRINGE | INTRAVENOUS | Status: AC
  Filled 2017-05-31: qty 10

## 2017-05-31 MED ORDER — LACTATED RINGERS IV SOLN
INTRAVENOUS | Status: DC | PRN
  Administered 2017-05-31 (×2): via INTRAVENOUS

## 2017-05-31 MED ORDER — LIDOCAINE HCL (CARDIAC) 20 MG/ML IV SOLN
INTRAVENOUS | Status: DC | PRN
  Administered 2017-05-31: 20 mg via INTRAVENOUS

## 2017-05-31 MED ORDER — MORPHINE SULFATE (PF) 4 MG/ML IV SOLN
INTRAVENOUS | Status: AC
  Filled 2017-05-31: qty 2

## 2017-05-31 MED ORDER — TRANEXAMIC ACID 1000 MG/10ML IV SOLN
1000.0000 mg | INTRAVENOUS | Status: AC
  Administered 2017-05-31: 1000 mg via INTRAVENOUS
  Filled 2017-05-31: qty 10

## 2017-05-31 MED ORDER — POTASSIUM CHLORIDE IN NACL 20-0.9 MEQ/L-% IV SOLN
INTRAVENOUS | Status: AC
  Administered 2017-05-31: 20:00:00 via INTRAVENOUS
  Filled 2017-05-31: qty 1000

## 2017-05-31 MED ORDER — MIDAZOLAM HCL 2 MG/2ML IJ SOLN
INTRAMUSCULAR | Status: AC
  Filled 2017-05-31: qty 2

## 2017-05-31 MED ORDER — ADULT MULTIVITAMIN W/MINERALS CH
1.0000 | ORAL_TABLET | Freq: Every day | ORAL | Status: DC
  Administered 2017-05-31 – 2017-06-02 (×3): 1 via ORAL
  Filled 2017-05-31 (×3): qty 1

## 2017-05-31 MED ORDER — OXYCODONE HCL ER 15 MG PO T12A
15.0000 mg | EXTENDED_RELEASE_TABLET | Freq: Two times a day (BID) | ORAL | Status: DC
  Administered 2017-05-31 – 2017-06-02 (×4): 15 mg via ORAL
  Filled 2017-05-31 (×4): qty 1

## 2017-05-31 MED ORDER — MENTHOL 3 MG MT LOZG: 1.0000 | LOZENGE | OROMUCOSAL | Status: DC | PRN

## 2017-05-31 MED ORDER — ONDANSETRON HCL 4 MG/2ML IJ SOLN
INTRAMUSCULAR | Status: AC
  Filled 2017-05-31: qty 2

## 2017-05-31 MED ORDER — PROPOFOL 500 MG/50ML IV EMUL
INTRAVENOUS | Status: DC | PRN
  Administered 2017-05-31: 50 ug/kg/min via INTRAVENOUS

## 2017-05-31 MED ORDER — METOCLOPRAMIDE HCL 5 MG/ML IJ SOLN: 5.0000 mg | Freq: Three times a day (TID) | INTRAMUSCULAR | Status: DC | PRN

## 2017-05-31 MED ORDER — MIDAZOLAM HCL 2 MG/2ML IJ SOLN
2.0000 mg | Freq: Once | INTRAMUSCULAR | Status: AC
  Administered 2017-05-31: 2 mg via INTRAVENOUS
  Filled 2017-05-31: qty 2

## 2017-05-31 MED ORDER — ROPIVACAINE HCL 2 MG/ML IJ SOLN
INTRAMUSCULAR | Status: DC | PRN
  Administered 2017-05-31: 10 mL/h via EPIDURAL

## 2017-05-31 MED ORDER — TRANEXAMIC ACID 1000 MG/10ML IV SOLN
2000.0000 mg | INTRAVENOUS | Status: AC
  Administered 2017-05-31: 2000 mg via TOPICAL
  Filled 2017-05-31: qty 20

## 2017-05-31 MED ORDER — ONDANSETRON HCL 4 MG/2ML IJ SOLN: 4.0000 mg | Freq: Four times a day (QID) | INTRAMUSCULAR | Status: DC | PRN

## 2017-05-31 MED ORDER — PANTOPRAZOLE SODIUM 40 MG PO TBEC
40.0000 mg | DELAYED_RELEASE_TABLET | Freq: Every day | ORAL | Status: DC
  Administered 2017-06-01 – 2017-06-02 (×2): 40 mg via ORAL
  Filled 2017-05-31 (×2): qty 1

## 2017-05-31 MED ORDER — FENTANYL CITRATE (PF) 250 MCG/5ML IJ SOLN
INTRAMUSCULAR | Status: AC
  Filled 2017-05-31: qty 5

## 2017-05-31 MED ORDER — KETOROLAC TROMETHAMINE 15 MG/ML IJ SOLN
15.0000 mg | Freq: Four times a day (QID) | INTRAMUSCULAR | Status: DC
  Administered 2017-05-31: 15 mg via INTRAVENOUS
  Filled 2017-05-31: qty 1

## 2017-05-31 MED ORDER — ROPIVACAINE HCL-NACL 0.2-0.9 % IJ SOLN
10.0000 mL | INTRAMUSCULAR | Status: DC
  Filled 2017-05-31: qty 200

## 2017-05-31 MED ORDER — ACETAMINOPHEN 650 MG RE SUPP: 650.0000 mg | Freq: Four times a day (QID) | RECTAL | Status: DC | PRN

## 2017-05-31 SURGICAL SUPPLY — 82 items
BAG DECANTER FOR FLEXI CONT (MISCELLANEOUS) ×3 IMPLANT
BANDAGE ACE 4X5 VEL STRL LF (GAUZE/BANDAGES/DRESSINGS) ×3 IMPLANT
BANDAGE ESMARK 6X9 LF (GAUZE/BANDAGES/DRESSINGS) ×1 IMPLANT
BLADE SAG 18X100X1.27 (BLADE) ×3 IMPLANT
BLADE SAW SGTL 13.0X1.19X90.0M (BLADE) IMPLANT
BLADE SURG 10 STRL SS (BLADE) ×3 IMPLANT
BNDG COHESIVE 6X5 TAN STRL LF (GAUZE/BANDAGES/DRESSINGS) ×3 IMPLANT
BNDG ELASTIC 6X10 VLCR STRL LF (GAUZE/BANDAGES/DRESSINGS) ×3 IMPLANT
BNDG ELASTIC 6X15 VLCR STRL LF (GAUZE/BANDAGES/DRESSINGS) ×9 IMPLANT
BNDG ESMARK 6X9 LF (GAUZE/BANDAGES/DRESSINGS) ×3
BOWL SMART MIX CTS (DISPOSABLE) IMPLANT
CAPT KNEE TRIATH TK-4 ×3 IMPLANT
CEMENT BONE SIMPLEX SPEEDSET (Cement) ×3 IMPLANT
CLOSURE STERI-STRIP 1/2X4 (GAUZE/BANDAGES/DRESSINGS) ×1
CLOSURE WOUND 1/2 X4 (GAUZE/BANDAGES/DRESSINGS) ×2
CLSR STERI-STRIP ANTIMIC 1/2X4 (GAUZE/BANDAGES/DRESSINGS) ×2 IMPLANT
COVER SURGICAL LIGHT HANDLE (MISCELLANEOUS) ×3 IMPLANT
CUFF TOURNIQUET SINGLE 34IN LL (TOURNIQUET CUFF) ×3 IMPLANT
CUFF TOURNIQUET SINGLE 44IN (TOURNIQUET CUFF) IMPLANT
DECANTER SPIKE VIAL GLASS SM (MISCELLANEOUS) ×9 IMPLANT
DRAPE INCISE IOBAN 66X45 STRL (DRAPES) IMPLANT
DRAPE ORTHO SPLIT 77X108 STRL (DRAPES) ×6
DRAPE SURG ORHT 6 SPLT 77X108 (DRAPES) ×3 IMPLANT
DRAPE U-SHAPE 47X51 STRL (DRAPES) ×3 IMPLANT
DRSG AQUACEL AG ADV 3.5X10 (GAUZE/BANDAGES/DRESSINGS) ×3 IMPLANT
DRSG AQUACEL AG ADV 3.5X14 (GAUZE/BANDAGES/DRESSINGS) ×3 IMPLANT
DRSG PAD ABDOMINAL 8X10 ST (GAUZE/BANDAGES/DRESSINGS) ×6 IMPLANT
DURAPREP 26ML APPLICATOR (WOUND CARE) ×6 IMPLANT
ELECT CAUTERY BLADE 6.4 (BLADE) ×6 IMPLANT
ELECT REM PT RETURN 9FT ADLT (ELECTROSURGICAL) ×3
ELECTRODE REM PT RTRN 9FT ADLT (ELECTROSURGICAL) ×1 IMPLANT
GAUZE SPONGE 4X4 12PLY STRL (GAUZE/BANDAGES/DRESSINGS) ×3 IMPLANT
GLOVE BIOGEL PI IND STRL 7.5 (GLOVE) ×1 IMPLANT
GLOVE BIOGEL PI IND STRL 8 (GLOVE) ×1 IMPLANT
GLOVE BIOGEL PI INDICATOR 7.5 (GLOVE) ×2
GLOVE BIOGEL PI INDICATOR 8 (GLOVE) ×2
GLOVE ECLIPSE 7.0 STRL STRAW (GLOVE) ×3 IMPLANT
GLOVE SURG ORTHO 8.0 STRL STRW (GLOVE) ×3 IMPLANT
GOWN STRL REUS W/ TWL LRG LVL3 (GOWN DISPOSABLE) ×3 IMPLANT
GOWN STRL REUS W/TWL LRG LVL3 (GOWN DISPOSABLE) ×6
HANDPIECE INTERPULSE COAX TIP (DISPOSABLE)
HOOD PEEL AWAY FLYTE STAYCOOL (MISCELLANEOUS) ×9 IMPLANT
IMMOBILIZER KNEE 20 (SOFTGOODS) IMPLANT
IMMOBILIZER KNEE 22 (SOFTGOODS) ×3 IMPLANT
IMMOBILIZER KNEE 22 UNIV (SOFTGOODS) IMPLANT
IMMOBILIZER KNEE 24 THIGH 36 (MISCELLANEOUS) IMPLANT
IMMOBILIZER KNEE 24 UNIV (MISCELLANEOUS)
KIT BASIN OR (CUSTOM PROCEDURE TRAY) ×3 IMPLANT
KIT ROOM TURNOVER OR (KITS) ×3 IMPLANT
MANIFOLD NEPTUNE II (INSTRUMENTS) ×3 IMPLANT
NDL SAFETY ECLIPSE 18X1.5 (NEEDLE) ×1 IMPLANT
NEEDLE 18GX1X1/2 (RX/OR ONLY) (NEEDLE) ×6 IMPLANT
NEEDLE 22X1 1/2 (OR ONLY) (NEEDLE) ×9 IMPLANT
NEEDLE HYPO 18GX1.5 SHARP (NEEDLE) ×2
NEEDLE HYPO 22GX1.5 SAFETY (NEEDLE) ×3 IMPLANT
NEEDLE SPNL 18GX3.5 QUINCKE PK (NEEDLE) ×3 IMPLANT
NS IRRIG 1000ML POUR BTL (IV SOLUTION) ×9 IMPLANT
PACK TOTAL JOINT (CUSTOM PROCEDURE TRAY) ×3 IMPLANT
PAD ARMBOARD 7.5X6 YLW CONV (MISCELLANEOUS) ×6 IMPLANT
PAD CAST 4YDX4 CTTN HI CHSV (CAST SUPPLIES) ×1 IMPLANT
PADDING CAST COTTON 4X4 STRL (CAST SUPPLIES) ×2
PADDING CAST COTTON 6X4 STRL (CAST SUPPLIES) ×3 IMPLANT
PENCIL BUTTON HOLSTER BLD 10FT (ELECTRODE) IMPLANT
SET HNDPC FAN SPRY TIP SCT (DISPOSABLE) IMPLANT
SPONGE LAP 18X18 X RAY DECT (DISPOSABLE) IMPLANT
STRIP CLOSURE SKIN 1/2X4 (GAUZE/BANDAGES/DRESSINGS) ×4 IMPLANT
SUCTION FRAZIER HANDLE 10FR (MISCELLANEOUS) ×2
SUCTION TUBE FRAZIER 10FR DISP (MISCELLANEOUS) ×1 IMPLANT
SUT MNCRL AB 3-0 PS2 18 (SUTURE) ×3 IMPLANT
SUT VIC AB 0 CT1 27 (SUTURE) ×6
SUT VIC AB 0 CT1 27XBRD ANBCTR (SUTURE) ×3 IMPLANT
SUT VIC AB 1 CT1 27 (SUTURE) ×10
SUT VIC AB 1 CT1 27XBRD ANBCTR (SUTURE) ×5 IMPLANT
SUT VIC AB 2-0 CT1 27 (SUTURE) ×8
SUT VIC AB 2-0 CT1 TAPERPNT 27 (SUTURE) ×4 IMPLANT
SYR 30ML LL (SYRINGE) ×9 IMPLANT
SYR 5ML LL (SYRINGE) ×6 IMPLANT
SYR TB 1ML LUER SLIP (SYRINGE) ×6 IMPLANT
TOWEL OR 17X24 6PK STRL BLUE (TOWEL DISPOSABLE) ×6 IMPLANT
TOWEL OR 17X26 10 PK STRL BLUE (TOWEL DISPOSABLE) ×6 IMPLANT
TRAY CATH 16FR W/PLASTIC CATH (SET/KITS/TRAYS/PACK) IMPLANT
WATER STERILE IRR 1000ML POUR (IV SOLUTION) IMPLANT

## 2017-05-31 NOTE — Op Note (Signed)
[name], HUN NO.:  [id number]  MEDICAL RECORD NO.:  [id number]  LOCATION:  5N01C                        FACILITY:  [facility]  PHYSICIAN:  Burnard Bunting, M.D.    DATE OF BIRTH:  27-Sep-1951  DATE OF PROCEDURE: DATE OF DISCHARGE:                              OPERATIVE REPORT   PREOPERATIVE DIAGNOSIS:  Right knee arthritis.  POSTOPERATIVE DIAGNOSIS:  Right knee arthritis.  PROCEDURE:  Right total knee replacement utilizing Stryker triathlon size 2 press-fit knee, size 3 cemented tibia, 9 mm polyethylene insert with 32 mm cemented 3 PEG patella.  SURGEON:  Burnard Bunting, M.D.  ASSISTANT:  Patrick Jupiter, RNFA.  INDICATIONS:  Gloria Patterson is a 66 year old patient with end-stage right knee arthritis, who presents for operative management after explanation of risks and benefits.  PROCEDURE IN DETAIL:  The patient was brought to the operating room where spinal anesthetic was induced.  Preoperative antibiotics administered.  Time-out was called.  Right leg was then prescrubbed with alcohol and Betadine and allowed to air dry, prepped with DuraPrep solution and draped in sterile manner.  Collier Flowers was used to cover the operative field.  Time-out was called.  Leg elevated and exsanguinated with Esmarch wrap.  Tourniquet was inflated.  Anterior approach to knee was made.  Skin and subcu tissue were sharply divided.  A median parapatellar approach was made.  This was precisely marked with a #1 Vicryl suture.  The fat pad was partially excised.  Lateral patellofemoral ligament was released.  Minimal soft tissue medial dissection was performed, but enough to allow placement of a Z retractor.  The ACL was released.  Significant tricompartmental arthritis was noted, worse in the medial and patellofemoral compartments.  Osteophytes were removed as well as the tissue on the anterior distal femur.  At this time, intramedullary alignment was utilized to make a 3-degree posterior  slope cut perpendicular to the mechanical axis of the tibia with the collaterals and posterior neurovascular structures well protected.  At this time, the PCL was also protected.  A 9-mm cut was made off the least affected lateral tibial plateau.  This was later revised 2 mm more.  Good quality bone was present; however, posterolaterally, the bone quality was less robust and thus, it was decided to perform cemented tibial component on this side. The femur was then prepared using intramedullary alignment.  An 8-mm distal cut was made and the extension spacing was checked with a 9-mm spacer, which achieved full extension and good stability to varus and valgus stress at 0 and 30 degrees.  The femur was sized to 2 and the anterior, posterior, and chamfer cuts were made.  PCL was preserved.  At this time, the tibia was then keel punched.  Patella prepared by cutting down from 22 to 12 mm, and a trial patellar button was placed.  With trial components in position including the 9-mm spacer, the patient achieved about 3 degrees of hyperextension, full flexion, excellent patellar tracking using no thumbs technique.  Trial components removed. True components placed.  Excess cement removed from the tibia and patella.  Same stability parameters and range of motion parameters maintained.  It should be noted that prior to cementing, solution of  Exparel, Marcaine, and saline was injected into the capsule.  Tranexamic acid sponge was also utilized to prevent postop hematoma.  Prior to cementing, thorough irrigation was also performed with pouring irrigation before placement of the Exparel and tranexamic acid.  After the cement had hardened, thorough irrigation again performed. Tourniquet released.  Incision was closed over bolster by first closing the arthrotomy with a #1 Vicryl suture followed by interrupted inverted 0 Vicryl suture, 2-0 Vicryl suture, and 3-0 Monocryl.  Steri-Strips and Aquacel  dressing placed along with Ace wrap and knee immobilizer.  The patient tolerated the procedure well without immediate complications. Transferred to the recovery room in stable condition.     Gloria Patterson, M.D.     GSD/MEDQ  D:  05/31/2017  T:  05/31/2017  Job:  [id number]

## 2017-05-31 NOTE — Progress Notes (Signed)
Called Dr. Maple HudsonMoser per Dr. Diamantina Providenceean's request to place a nerve block for patient. Dr. Maple HudsonMoser unavailable at this time and told RN to notify another anesthesiologist.  OR desk notified.

## 2017-05-31 NOTE — Anesthesia Postprocedure Evaluation (Signed)
Anesthesia Post Note  Patient: Anson CroftsDorothy J Cathell  Procedure(s) Performed: Procedure(s) (LRB): RIGHT TOTAL KNEE ARTHROPLASTY (Right)     Patient location during evaluation: PACU Anesthesia Type: Regional Level of consciousness: awake Pain management: pain level controlled Vital Signs Assessment: post-procedure vital signs reviewed and stable Respiratory status: spontaneous breathing Cardiovascular status: stable Anesthetic complications: no    Last Vitals:  Vitals:   05/31/17 1738 05/31/17 1745  BP: (!) 148/75   Pulse: (!) 59 (!) 59  Resp: 10 (!) 8  Temp:  36.1 C    Last Pain:  Vitals:   05/31/17 1730  TempSrc:   PainSc: 0-No pain    LLE Motor Response: Purposeful movement;Responds to commands (05/31/17 1745) LLE Sensation: Full sensation (05/31/17 1745) RLE Motor Response: Purposeful movement;Responds to commands (05/31/17 1745) RLE Sensation: Full sensation (05/31/17 1745) L Sensory Level: S1-Sole of foot, small toes (05/31/17 1745) R Sensory Level: S1-Sole of foot, small toes (05/31/17 1745)  Graylin Sperling

## 2017-05-31 NOTE — Progress Notes (Signed)
Orthopedic Tech Progress Note Patient Details:  Threasa HeadsDorothy J Rueth 1951/05/30 [id number]  CPM Right Knee CPM Right Knee: On Right Knee Flexion (Degrees): 40 Right Knee Extension (Degrees): 10 Additional Comments: applied cpm at 10 to 40 degrees on right leg/knee. pt tolerated well.  Provided bone foam zero degree at bedside.   Right knee/leg.   Alvina ChouWilliams, Atwell Mcdanel C 05/31/2017, 5:49 PM

## 2017-05-31 NOTE — Brief Op Note (Signed)
05/31/2017  7:35 PM  PATIENT:  Gloria Patterson  66 y.o. female  PRE-OPERATIVE DIAGNOSIS:  RIGHT KNEE OSTEOARTHRITIS  POST-OPERATIVE DIAGNOSIS:  RIGHT KNEE OSTEOARTHRITIS  PROCEDURE:  Procedure(s): RIGHT TOTAL KNEE ARTHROPLASTY  SURGEON:  Surgeon(s): Cammy Copaean, Scott Gregory, MD  ASSISTANT: Patrick Jupiterarla Bethune rnfa  ANESTHESIA:   spinal  EBL: 50 ml    No intake/output data recorded.  BLOOD ADMINISTERED: none  DRAINS: none   LOCAL MEDICATIONS USED:  Marcaine mso4 clonidine exparel  SPECIMEN:  No Specimen  COUNTS:  YES  TOURNIQUET:   Total Tourniquet Time Documented: Thigh (Right) - 92 minutes Total: Thigh (Right) - 92 minutes   DICTATION: .Other Dictation: Dictation Number [id number]  PLAN OF CARE: Admit to inpatient   PATIENT DISPOSITION:  PACU - hemodynamically stable

## 2017-05-31 NOTE — Anesthesia Preprocedure Evaluation (Signed)
Anesthesia Evaluation  Patient identified by MRN, date of birth, ID band Patient awake    Reviewed: Allergy & Precautions, NPO status , Patient's Chart, lab work & pertinent test results  History of Anesthesia Complications Negative for: history of anesthetic complications  Airway Mallampati: II  TM Distance: >3 FB Neck ROM: Full    Dental  (+) Edentulous Lower, Edentulous Upper   Pulmonary asthma ,    breath sounds clear to auscultation       Cardiovascular hypertension, Pt. on medications (-) angina(-) Past MI and (-) CHF  Rhythm:Regular     Neuro/Psych Seizures -,  PSYCHIATRIC DISORDERS    GI/Hepatic GERD  Medicated and Controlled,(+) Hepatitis -  Endo/Other  diabetes, Type 2  Renal/GU      Musculoskeletal  (+) Arthritis ,   Abdominal   Peds  Hematology negative hematology ROS (+)   Anesthesia Other Findings   Reproductive/Obstetrics                            Anesthesia Physical Anesthesia Plan  ASA: III  Anesthesia Plan: MAC, Regional and Spinal   Post-op Pain Management:    Induction:   PONV Risk Score and Plan: 2 and Ondansetron and Dexamethasone  Airway Management Planned: Nasal Cannula  Additional Equipment:   Intra-op Plan:   Post-operative Plan:   Informed Consent: I have reviewed the patients History and Physical, chart, labs and discussed the procedure including the risks, benefits and alternatives for the proposed anesthesia with the patient or authorized representative who has indicated his/her understanding and acceptance.   Dental advisory given  Plan Discussed with: CRNA and Surgeon  Anesthesia Plan Comments:         Anesthesia Quick Evaluation

## 2017-05-31 NOTE — Transfer of Care (Signed)
Immediate Anesthesia Transfer of Care Note  Patient: Gloria CroftsDorothy J Tourigny  Procedure(s) Performed: Procedure(s): RIGHT TOTAL KNEE ARTHROPLASTY (Right)  Patient Location: PACU  Anesthesia Type:Spinal  Level of Consciousness: awake, alert  and oriented  Airway & Oxygen Therapy: Patient Spontanous Breathing and Patient connected to nasal cannula oxygen  Post-op Assessment: Report given to RN and Post -op Vital signs reviewed and stable  Post vital signs: Reviewed and stable  Last Vitals:  Vitals:   05/31/17 1053 05/31/17 1701  BP: (!) 152/71   Pulse: 81 (P) 67  Resp: (!) 0 (P) 10  Temp: 36.6 C (P) 36.3 C    Last Pain:  Vitals:   05/31/17 1127  TempSrc:   PainSc: 9          Complications: No apparent anesthesia complications

## 2017-05-31 NOTE — Anesthesia Procedure Notes (Signed)
Date/Time: 05/31/2017 1:30 PM Performed by: Carmela RimaMARTINELLI, Nabil Bubolz F Pre-anesthesia Checklist: Timeout performed, Patient being monitored, Suction available, Emergency Drugs available and Patient identified Patient Re-evaluated:Patient Re-evaluated prior to inductionOxygen Delivery Method: Simple face mask

## 2017-05-31 NOTE — Anesthesia Procedure Notes (Signed)
Spinal  Patient location during procedure: OR Start time: 05/31/2017 1:46 PM End time: 05/31/2017 1:49 PM Staffing Anesthesiologist: Val EagleMOSER, Glenetta Kiger Preanesthetic Checklist Completed: patient identified, surgical consent, pre-op evaluation, timeout performed, IV checked, risks and benefits discussed and monitors and equipment checked Spinal Block Patient position: sitting Prep: site prepped and draped and DuraPrep Patient monitoring: heart rate, cardiac monitor, continuous pulse ox and blood pressure Approach: midline Location: L4-5 Injection technique: single-shot Needle Needle type: Pencan  Needle gauge: 24 G Needle length: 10 cm Assessment Sensory level: T6

## 2017-05-31 NOTE — Anesthesia Procedure Notes (Addendum)
Anesthesia Regional Block: Femoral nerve block   Pre-Anesthetic Checklist: ,, timeout performed, Correct Patient, Correct Site, Correct Laterality, Correct Procedure, Correct Position, site marked, Risks and benefits discussed,  Surgical consent,  Pre-op evaluation,  At surgeon's request and post-op pain management  Laterality: Lower and Right  Prep: chloraprep       Needles:  Injection technique: Catheter  Needle Type: Echogenic Stimulator Needle        Needle insertion depth: 6 cm  Catheter at skin depth: 15 cm  Additional Needles:   Procedures: ultrasound guided, nerve stimulator,,,,,,   Nerve Stimulator or Paresthesia:  Response: quad, 0.5 mA,   Additional Responses:   Narrative:  Start time: 05/31/2017 1:18 PM End time: 05/31/2017 1:36 PM Injection made incrementally with aspirations every 5 mL.  Performed by: Personally  Anesthesiologist: Kammie Scioli  Additional Notes: H+P and labs reviewed, risks and benefits discussed with patient, procedure tolerated well without complications

## 2017-05-31 NOTE — Progress Notes (Signed)
Pt has been screaming in pain since 1942 when dilaudid was given. Dr. Okey Dupreose in anesthesiology notified. He increased ripivocaine to 14cc/hr. Dr. Cleophas DunkerWhitfield notified of ongoing screaming pain, and toradol and xanax were added to her medication orders. Dr. Okey Dupreose contacted again because order for continuous infusion of ripivocaine was not in the orders. New verbal orders received from Dr. Okey Dupreose to continue infusion throughout the night at 14cc/hr. Anesthesia will evaluate her in the morning. Pharmacy [facility]) also notified of this medication since they were not familiar with it being administered on the floor. Pt finally calmed down at 2100. She reported that she had taken methadone this morning.

## 2017-05-31 NOTE — H&P (Signed)
TOTAL KNEE ADMISSION H&P  Patient is being admitted for right total knee arthroplasty.  Subjective:  Chief Complaint:right knee pain.  HPI: Gloria Patterson, 66 y.o. female, has a history of pain and functional disability in the right knee due to arthritis and has failed non-surgical conservative treatments for greater than 12 weeks to includeNSAID's and/or analgesics, corticosteriod injections, use of assistive devices and activity modification.  Onset of symptoms was gradual, starting 8 years ago with gradually worsening course since that time. The patient noted no past surgery on the right knee(s).  Patient currently rates pain in the right knee(s) at 10 out of 10 with activity. Patient has night pain, worsening of pain with activity and weight bearing, pain that interferes with activities of daily living, pain with passive range of motion, crepitus and joint swelling.  Patient has evidence of subchondral sclerosis and joint space narrowing by imaging studies. This patient has had A good result with her left total knee replacement. There is no active infection.  Patient Active Problem List   Diagnosis Date Noted  . HBP (high blood pressure) 08/25/2013  . GERD (gastroesophageal reflux disease) 05/23/2013  . Narcotic withdrawal (HCC) 05/23/2013  . Acute bronchitis 05/23/2013  . Osteoarthritis of left knee 04/08/2013    Class: Diagnosis of  . Upper airway cough syndrome 10/16/2012   Past Medical History:  Diagnosis Date  . Arthritis    knee, shouder  . Asthma    MILD  . Chronic cough   . Constipation   . Diabetes mellitus without complication (HCC)    Type II  . Frequency of urination   . GERD (gastroesophageal reflux disease)   . Hepatitis    Hepatitis C treated   . 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 History of kidney  stones    passed  . Hypertension   . Nocturia   . 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  Patient not sure why she was given Phenobarital,   " before 1978"  . Urgency of urination   . Vaginal vault prolapse    ANTERIOR    Past Surgical History:  Procedure Laterality Date  . ANTERIOR AND POSTERIOR REPAIR N/A 03/10/2013   Procedure: ANTERIOR (CYSTOCELE) AND POSTERIOR REPAIR (RECTOCELE);  Surgeon: Kathi Ludwig, MD;  Location: [facility];  Service: Urology;  Laterality: N/A;   [employer] UPHOLD LITE ANTERIOR VAULT REPAIR  POSSIBLE OP WITH OBSERVATION POSSIBLE FLOOR BED  Uphold LITE w/Capio SLIM Catalog number [id number] qty 2 Xenform soft tissue repair matrix 6x7 cm [id number]   . BREAST REDUCTION SURGERY    . BREAST SURGERY     B/L removal of benign cysts  . COLONOSCOPY W/ POLYPECTOMY    . CYSTOCELE REPAIR N/A 03/10/2013   Procedure:  Anterior vaginal vault prolapse repair, with [employer] uphold light sacrospinous fixation using U. device and mesh repair with [name] plication   ;  Surgeon: Kathi Ludwig, MD;  Location: [facility];  Service: Urology;  Laterality: N/A;  . ESOPHAGOGASTRODUODENOSCOPY    . TOTAL KNEE ARTHROPLASTY Left 04/08/2013   Procedure: TOTAL KNEE ARTHROPLASTY;  Surgeon: Cammy Copa, MD;  Location: [facility] OR;  Service: Orthopedics;  Laterality: Left;  Left total knee arthroplasty  . TUBAL LIGATION  Prescriptions Prior to Admission  Medication Sig Dispense Refill Last Dose  . cloNIDine (CATAPRES) 0.1 MG tablet Take 1 tablet (0.1 mg total) by mouth 2 (two) times daily. 60 tablet 11 05/31/2017 at 0730  . meloxicam (MOBIC) 15 MG tablet Take 15 mg by mouth daily.    Past Week at Unknown time  . metFORMIN (GLUCOPHAGE) 500 MG tablet TAKE 1 TABLET BY MOUTH EVERY DAY WITH FOOD - OFFICE VISIT BEFORE NEXT REFILL  5 05/30/2017 at Unknown time  . METHADONE HCL PO Take 88 mg by mouth daily. Liquid unit doses from  ADS [phone].   05/31/2017 at 0730  . Multiple Vitamin (MULTIVITAMIN WITH MINERALS) TABS tablet Take 1 tablet by mouth daily.   Past Week at Unknown time  . Nutritional Supplements (ESTROVEN PO) Take 1 tablet by mouth daily.   Past Week at Unknown time  . omeprazole (PRILOSEC) 40 MG capsule Take 1 capsule (40 mg total) by mouth daily. 30 capsule 11 05/31/2017 at 0730  . polyethylene glycol (MIRALAX / GLYCOLAX) packet Take 17 g by mouth daily as needed.   Past Week at Unknown time  . traMADol (ULTRAM) 50 MG tablet Take 1 tablet (50 mg total) by mouth 2 (two) times daily. (Patient taking differently: Take 50 mg by mouth 2 (two) times daily as needed (pain). ) 30 tablet 0 05/31/2017 at 0730  . triamcinolone cream (KENALOG) 0.1 % Apply 1 application topically daily as needed for itching or rash.  1 Past Week at Unknown time  . albuterol (PROVENTIL HFA) 108 (90 BASE) MCG/ACT inhaler Inhale 2 puffs into the lungs every 4 (four) hours as needed for wheezing or shortness of breath.    More than a month at Unknown time  . atorvastatin (LIPITOR) 20 MG tablet Take 20 mg by mouth at bedtime.   Unknown at Unknown time  . famotidine (PEPCID) 20 MG tablet TAKE 1 TABLET (20 MG TOTAL) BY MOUTH EVERY EVENING. (Patient not taking: Reported on 05/24/2017) 30 tablet 0 Not Taking at Unknown time  . gabapentin (NEURONTIN) 100 MG capsule TAKE 1 CAPSULE (100 MG TOTAL) BY MOUTH 3 (THREE) TIMES DAILY. (Patient not taking: Reported on 05/24/2017) 90 capsule 0 Not Taking at Unknown time   Allergies  Allergen Reactions  . Phenobarbital Other (See Comments)     ? SEIZURES ?  [ QUESTION IF SZs RESULT FROM PHENOBARB ? ]     Social History  Substance Use Topics  . Smoking status: Never Smoker  . Smokeless tobacco: Never Used     Comment: CANNOT HAVE NARCOTIC MEDICATIONS PER PT  . Alcohol use No    Family History  Problem Relation Age of Onset  . Prostate cancer Father   . Breast cancer Sister      Review of Systems   Musculoskeletal: Positive for joint pain.  All other systems reviewed and are negative.   Objective:  Physical Exam  Constitutional: She appears well-developed.  HENT:  Head: Normocephalic.  Eyes: Pupils are equal, round, and reactive to light.  Neck: Normal range of motion.  Cardiovascular: Normal rate.   Respiratory: Effort normal.  Neurological: She is alert.  Skin: Skin is warm.  Psychiatric: She has a normal mood and affect.  Right knee demonstrates reasonable range of motion with medial joint line tenderness palpable pedal pulses intact extensor mechanism no groin pain with internal/external rotation of the leg stable collateral and cruciate ligaments mild effusion is present.  No other masses lymph adenopathy or skin changes  noted in the right knee region.  Pre-significant pain and tenderness to palpation along the medial and lateral joint line.  Vital signs in last 24 hours: Temp:  [97.8 F (36.6 C)] 97.8 F (36.6 C) (06/14 1053) Pulse Rate:  [81] 81 (06/14 1053) Resp:  [0] 0 (06/14 1053) BP: (152)/(71) 152/71 (06/14 1053) SpO2:  [97 %] 97 % (06/14 1053) Weight:  [166 lb (75.3 kg)] 166 lb (75.3 kg) (06/14 1053)  Labs:   Estimated body mass index is 30.36 kg/m as calculated from the following:   Height as of 05/25/17: 5\' 2"  (1.575 m).   Weight as of this encounter: 166 lb (75.3 kg).   Imaging Review Plain radiographs demonstrate moderate degenerative joint disease of the right knee(s). The overall alignment ismild varus. The bone quality appears to be good for age and reported activity level.  Assessment/Plan:  End stage arthritis, right knee   The patient history, physical examination, clinical judgment of the provider and imaging studies are consistent with end stage degenerative joint disease of the right knee(s) and total knee arthroplasty is deemed medically necessary. The treatment options including medical management, injection therapy arthroscopy and  arthroplasty were discussed at length. The risks and benefits of total knee arthroplasty were presented and reviewed. The risks due to aseptic loosening, infection, stiffness, patella tracking problems, thromboembolic complications and other imponderables were discussed. The patient acknowledged the explanation, agreed to proceed with the plan and consent was signed. Patient is being admitted for inpatient treatment for surgery, pain control, PT, OT, prophylactic antibiotics, VTE prophylaxis, progressive ambulation and ADL's and discharge planning. The patient is planning to be discharged to skilled nursing facility

## 2017-06-01 ENCOUNTER — Encounter (HOSPITAL_COMMUNITY): Payer: Self-pay | Admitting: Orthopedic Surgery

## 2017-06-01 LAB — GLUCOSE, CAPILLARY
Glucose-Capillary: 160 mg/dL — ABNORMAL HIGH (ref 65–99)
Glucose-Capillary: 137 mg/dL — ABNORMAL HIGH (ref 65–99)
Glucose-Capillary: 152 mg/dL — ABNORMAL HIGH (ref 65–99)
Glucose-Capillary: 169 mg/dL — ABNORMAL HIGH (ref 65–99)

## 2017-06-01 MED ORDER — RIVAROXABAN 10 MG PO TABS: 10.0000 mg | ORAL_TABLET | Freq: Every day | ORAL | 0 refills | Status: DC

## 2017-06-01 MED ORDER — METHOCARBAMOL 500 MG PO TABS: 500.0000 mg | ORAL_TABLET | Freq: Four times a day (QID) | ORAL | 0 refills | Status: DC | PRN

## 2017-06-01 NOTE — Social Work (Signed)
[facility] Weekend admission staff is Jill Sidelison [phone].

## 2017-06-01 NOTE — Progress Notes (Signed)
Orthopedic Tech Progress Note Patient Details:  Gloria Patterson 07/23/1951 [id number] On cpm at 1810 Patient ID: Gloria Headsorothy J Patterson, female   DOB: 07/23/1951, 66 y.o.   MRN: [id number]   Gloria Patterson, Gloria Patterson 06/01/2017, 6:10 PM

## 2017-06-01 NOTE — Progress Notes (Signed)
Physical Therapy Treatment Patient Details Name: Gloria Patterson MRN: [id number] DOB: 10/27/1951 Today's Date: 06/01/2017    History of Present Illness Pt is a 66 yo female with c/o R knee pain secondary to end stage R knee OA, s/p R TKA 06/01/17. PMH significant for HBP, GERD, OA and narcotics withdrawl.     PT Comments    Pt did not tolerate session this afternoon. Pt started supine exercises and [facility] Pt stated that she was trying to keep her pain medication usage to a minimum. Pt was modA for bed mobility and [facility] and then maxA for transfer back to bed and into bed. Pt educated that she needs to try to work with her pain management so that she is better able to participate with therapy. Pt requires skilled PT to progress transfer and gait training and improve LE strength and ROM to safely mobilize in her discharge environment.      Follow Up Recommendations  DC plan and follow up therapy as arranged by surgeon     Equipment Recommendations  Other (comment) (to be determined at next venue)    Recommendations for Other Services OT consult     Precautions / Restrictions Precautions Precautions: Knee Precaution Booklet Issued: Yes (comment) Required Braces or Orthoses: Knee Immobilizer - Right Restrictions Weight Bearing Restrictions: Yes RLE Weight Bearing: Weight bearing as tolerated    Mobility  Bed Mobility Overal bed mobility: Needs Assistance Bed Mobility: Supine to Sit;Sit to Supine     Supine to sit: Mod assist Sit to supine: Max assist   General bed mobility comments: modA for LE management to floor, maxA to manage LE back into bed and square in the bed  Transfers Overall transfer level: Needs assistance Equipment used: Rolling walker (2 wheeled) Transfers: Stand Pivot Transfers Sit to Stand: Min assist Stand pivot transfers: Mod assist;Max assist       General transfer comment: modA [facility], maxA  pivot back to bed   Ambulation/Gait Ambulation/Gait assistance: Min guard   Assistive device: Rolling walker (2 wheeled) Gait Pattern/deviations: Step-to pattern;Decreased step length - right;Decreased step length - left;Decreased weight shift to right;Shuffle Gait velocity: slowed   General Gait Details: min guard for safety, vc for increased heel strike with R LE and for upright posture         Balance Overall balance assessment: Needs assistance Sitting-balance support: No upper extremity supported;Feet supported Sitting balance-Leahy Scale: Fair     Standing balance support: No upper extremity supported;Single extremity supported Standing balance-Leahy Scale: Zero Standing balance comment: not able to come to upright                            Cognition Arousal/Alertness: Awake/alert Behavior During Therapy: WFL for tasks assessed/performed Overall Cognitive Status: Within Functional Limits for tasks assessed                                        Exercises Total Joint Exercises Ankle Circles/Pumps: AROM;Both;10 reps;Seated Heel Slides: AROM;Right;10 reps;Seated Hip ABduction/ADduction: AROM;Right;10 reps;Supine    General Comments General comments (skin integrity, edema, etc.): Pt commented that she was trying really hard not to use too much pain medication and had not had any since this morning. Pt pain was increased with [facility] and pt  required maxA for getting back to bed secondary to pain increase. Pt educated on need to stay on top of pain so that she can make the most of her therapy sessions.       Pertinent Vitals/Pain Pain Assessment: 0-10 Pain Score: 8  Pain Location: R knee Pain Descriptors / Indicators: Constant;Throbbing;Aching;Guarding;Grimacing Pain Intervention(s): Limited activity within patient's tolerance;Patient requesting pain meds-RN notified;RN gave pain meds during session;Monitored during  session;Repositioned  VSS    Home Living Family/patient expects to be discharged to:: Skilled nursing facility Living Arrangements: Other relatives (lives with grandson ) Available Help at Discharge: Skilled Nursing Facility Type of Home: Apartment Home Access: Elevator;Level entry   Home Layout: One level Home Equipment: Cane - single point      Prior Function Level of Independence: Independent with assistive device(s)      Comments: using cane for functional mobility    PT Goals (current goals can now be found in the care plan section) Acute Rehab PT Goals Patient Stated Goal: go home soon PT Goal Formulation: With patient Time For Goal Achievement: 06/08/17 Potential to Achieve Goals: Good Progress towards PT goals: PT to reassess next treatment    Frequency    7X/week      PT Plan Current plan remains appropriate    Co-evaluation              AM-PAC PT "6 Clicks" Daily Activity  Outcome Measure  Difficulty turning over in bed (including adjusting bedclothes, sheets and blankets)?: Total Difficulty moving from lying on back to sitting on the side of the bed? : Total Difficulty sitting down on and standing up from a chair with arms (e.g., wheelchair, bedside commode, etc,.)?: Total Help needed moving to and from a bed to chair (including a wheelchair)?: Total Help needed walking in hospital room?: Total Help needed climbing 3-5 steps with a railing? : Total 6 Click Score: 6    End of Session Equipment Utilized During Treatment: Gait belt Activity Tolerance: Patient tolerated treatment well Patient left: in chair;with call bell/phone within reach;with nursing/sitter in room Nurse Communication: Mobility status;Precautions;Weight bearing status PT Visit Diagnosis: Other abnormalities of gait and mobility (R26.89);Muscle weakness (generalized) (M62.81);Pain;Difficulty in walking, not elsewhere classified (R26.2) Pain - Right/Left: Right Pain - part of  body: Knee     Time: 1610-9604 PT Time Calculation (min) (ACUTE ONLY): 25 min  Charges:  $Therapeutic Exercise: 8-22 mins $Therapeutic Activity: 8-22 mins                    G Codes:       Orest Dygert B. Beverely Risen PT, DPT Acute Rehabilitation  [phone] Pager [phone]     Gloria Patterson 06/01/2017, 3:37 PM

## 2017-06-01 NOTE — Clinical Social Work Note (Signed)
Clinical Social Work Assessment  Patient Details  Name: Gloria Patterson MRN: [id number] Date of Birth: 05/10/1951  Date of referral:  06/01/17               Reason for consult:  Facility Placement                Permission sought to share information with:  Facility Art therapist granted to share information::  Yes, Verbal Permission Granted  Name::        Agency::  SNF  Relationship::     Contact Information:     Housing/Transportation Living arrangements for the past 2 months:  Single Family Home Source of Information:  Patient Patient Interpreter Needed:  None Criminal Activity/Legal Involvement Pertinent to Current Situation/Hospitalization:  No - Comment as needed Significant Relationships:  Other Family Members Lives with:  Relatives Do you feel safe going back to the place where you live?  No Need for family participation in patient care:  No (Coment)  Care giving concerns:  Patient resides with grandchildren and is unsafe to return at this time. Patient amenable to SNF.  Social Worker assessment / plan:  CSW met with patient to discuss clinical team's recommendations for SNF at DC.  CSW explained her role and discussed SNF options/placement.  Patient has experience with SNF's and understand the process. Patient indicated she wants to ensure that she can get her methadone in the facility. CSW will f/u when offers received. CSW obtained permission to send offers to facilities.   FL2 complete. Passr obtained. Offers sent.  Employment status:  Retired Nurse, adult PT Recommendations:  [place] / Referral to community resources:  [place]  Patient/Family's Response to care:  Patient appreciative of assistance from [place]. No issues or concerns with care identified at this time.  Patient/Family's Understanding of and Emotional Response to Diagnosis, Current Treatment, and Prognosis:   Patient has good understanding of diagnosis, current treatment and prognosis and is hopeful that rehabilitation will assist with physical impairment. No issues or concerns at this time.  Emotional Assessment Appearance:  Appears stated age Attitude/Demeanor/Rapport:   (Cooperative) Affect (typically observed):  Accepting, Appropriate Orientation:  Oriented to Self, Oriented to Place, Oriented to  Time, Oriented to Situation Alcohol / Substance use:  Not Applicable Psych involvement (Current and /or in the community):  No (Comment)  Discharge Needs  Concerns to be addressed:  Care Coordination Readmission within the last 30 days:  No Current discharge risk:  Physical Impairment, Dependent with Mobility Barriers to Discharge:  No Barriers Identified   Gloria Baxter, LCSW 06/01/2017, 1:51 PM

## 2017-06-01 NOTE — Care Management Note (Signed)
Case Management Note  Patient Details  Name: Gloria Patterson MRN: [id number] Date of Birth: Apr 18, 1951  Subjective/Objective:  66 yr old female s/p right total knee arthroplasty.                  Action/Plan: [facility], Social worker is aware and facilitating.   Expected Discharge Date:  06/02/17               Expected Discharge Plan:   Skilled Nursing Facility  In-House Referral:  Clinical Social Work  Discharge planning Services     Post Acute Care Choice:  NA Choice offered to:  NA  DME Arranged:    DME Agency:  NA  HH Arranged:  NA HH Agency:  NA  Status of Service:  Completed, signed off  If discussed at [employer] of [employer] Meetings, dates discussed:    Additional Comments:  Durenda GuthrieBrady, Mathayus Stanbery Naomi, RN 06/01/2017, 10:49 AM

## 2017-06-01 NOTE — NC FL2 (Addendum)
[place] MEDICAID FL2 LEVEL OF CARE SCREENING TOOL     IDENTIFICATION  Patient Name: Gloria Patterson Birthdate: 18-Sep-1951 Sex: female Admission Date (Current Location): 05/31/2017  [facility] and [place] Number:  Producer, television/film/video and Address:  [facility], [address] [place], [place] [address]      Provider Number: [id number]  Attending Physician Name and Address:  Cammy Copa, MD  Relative Name and Phone Number:       Current Level of Care: Hospital Recommended Level of Care: Skilled Nursing Facility Prior Approval Number:    Date Approved/Denied: 06/01/17 PASRR Number: [id number]  Discharge Plan: SNF    Current Diagnoses: Patient Active Problem List   Diagnosis Date Noted  . Arthritis of knee 05/31/2017  . HBP (high blood pressure) 08/25/2013  . GERD (gastroesophageal reflux disease) 05/23/2013  . Narcotic withdrawal (HCC) 05/23/2013  . Acute bronchitis 05/23/2013  . Osteoarthritis of left knee 04/08/2013    Class: Diagnosis of  . Upper airway cough syndrome 10/16/2012    Orientation RESPIRATION BLADDER Height & Weight     Self, Time, Situation, Place  O2 (nasal cannula 2L) Continent, Indwelling catheter Weight: 165 lb 5.5 oz (75 kg) Height:  5\' 2"  (157.5 cm)  BEHAVIORAL SYMPTOMS/MOOD NEUROLOGICAL BOWEL NUTRITION STATUS      Continent Diet (See DC Summary)  AMBULATORY STATUS COMMUNICATION OF NEEDS Skin     Verbally Surgical wounds (right closed knee incision, compression wrap)                       Personal Care Assistance Level of Assistance  Bathing, Feeding, Dressing           Functional Limitations Info  Sight, Hearing, Speech Sight Info: Adequate Hearing Info: Adequate Speech Info: Adequate    SPECIAL CARE FACTORS FREQUENCY  PT (By licensed PT), OT (By licensed OT)     PT Frequency: 5xweek OT Frequency: 5week            Contractures      Additional Factors Info  Code Status,  Allergies Code Status Info: Full Allergies Info: PHENOBARBITAL            Current Medications (06/01/2017):  This is the current hospital active medication list Current Facility-Administered Medications  Medication Dose Route Frequency Provider Last Rate Last Dose  . 0.9 % NaCl with KCl 20 mEq/ L  infusion   Intravenous Continuous Cammy Copa, MD 10 mL/hr at 06/01/17 [phone]    . acetaminophen (TYLENOL) tablet 650 mg  650 mg Oral Q6H PRN Cammy Copa, MD   650 mg at 06/01/17 0451   Or  . acetaminophen (TYLENOL) suppository 650 mg  650 mg Rectal Q6H PRN Cammy Copa, MD      . albuterol (PROVENTIL) (2.5 MG/3ML) 0.083% nebulizer solution 3 mL  3 mL Inhalation Q4H PRN Cammy Copa, MD      . ALPRAZolam Prudy Feeler) tablet 0.5 mg  0.5 mg Oral TID PRN Valeria Batman, MD      . atorvastatin (LIPITOR) tablet 20 mg  20 mg Oral QHS Cammy Copa, MD   20 mg at 05/31/17 2143  . cloNIDine (CATAPRES) tablet 0.1 mg  0.1 mg Oral BID Cammy Copa, MD   0.1 mg at 06/01/17 3086  . docusate sodium (COLACE) capsule 100 mg  100 mg Oral BID Cammy Copa, MD   100 mg at 06/01/17 0819  . gabapentin (  NEURONTIN) capsule 300 mg  300 mg Oral TID Cammy Copaean, Scott Gregory, MD   300 mg at 06/01/17 0819  . HYDROmorphone (DILAUDID) injection 1 mg  1 mg Intravenous Q3H PRN Cammy Copaean, Scott Gregory, MD   1 mg at 06/01/17 0100  . lactated ringers infusion   Intravenous Continuous Achille RichHodierne, Adam, MD 10 mL/hr at 05/31/17 1132    . menthol-cetylpyridinium (CEPACOL) lozenge 3 mg  1 lozenge Oral PRN Cammy Copaean, Scott Gregory, MD       Or  . phenol (CHLORASEPTIC) mouth spray 1 spray  1 spray Mouth/Throat PRN Cammy Copaean, Scott Gregory, MD      . metFORMIN (GLUCOPHAGE) tablet 500 mg  500 mg Oral Q breakfast Cammy Copaean, Scott Gregory, MD   500 mg at 06/01/17 0819  . methocarbamol (ROBAXIN) tablet 500 mg  500 mg Oral Q6H PRN Cammy Copaean, Scott Gregory, MD   500 mg at 06/01/17 1206   Or  . methocarbamol (ROBAXIN) 500 mg in  dextrose 5 % 50 mL IVPB  500 mg Intravenous Q6H PRN Cammy Copaean, Scott Gregory, MD      . metoCLOPramide (REGLAN) tablet 5-10 mg  5-10 mg Oral Q8H PRN Cammy Copaean, Scott Gregory, MD       Or  . metoCLOPramide (REGLAN) injection 5-10 mg  5-10 mg Intravenous Q8H PRN Cammy Copaean, Scott Gregory, MD      . multivitamin with minerals tablet 1 tablet  1 tablet Oral Daily Cammy Copaean, Scott Gregory, MD   1 tablet at 06/01/17 0818  . ondansetron (ZOFRAN) tablet 4 mg  4 mg Oral Q6H PRN Cammy Copaean, Scott Gregory, MD       Or  . ondansetron [facility](ZOFRAN) injection 4 mg  4 mg Intravenous Q6H PRN Cammy Copaean, Scott Gregory, MD      . oxyCODONE (Oxy IR/ROXICODONE) immediate release tablet 5-10 mg  5-10 mg Oral Q3H PRN Cammy Copaean, Scott Gregory, MD   10 mg at 06/01/17 1208  . oxyCODONE (OXYCONTIN) 12 hr tablet 15 mg  15 mg Oral Q12H Cammy Copaean, Scott Gregory, MD   15 mg at 06/01/17 0819  . pantoprazole (PROTONIX) EC tablet 40 mg  40 mg Oral Daily Cammy Copaean, Scott Gregory, MD   40 mg at 06/01/17 0819  . rivaroxaban (XARELTO) tablet 10 mg  10 mg Oral Q breakfast Cammy Copaean, Scott Gregory, MD   10 mg at 06/01/17 1051  . ropivacaine (PF) 2 mg/mL (0.2%) (NAROPIN) injection  14 mL/hr Infiltration Continuous Eilene Ghaziose, George, MD 14 mL/hr at 06/01/17 0452 14 mL/hr at 06/01/17 0452     Discharge Medications: Please see discharge summary for a list of discharge medications.  Relevant Imaging Results:  Relevant Lab Results:   Additional Information SS: [id number]  Tresa MoorePatricia V Pencil, LCSW

## 2017-06-01 NOTE — Addendum Note (Signed)
Addendum  created 06/01/17 1358 by Arta Brucessey, Andriea Hasegawa, MD   Sign clinical note

## 2017-06-01 NOTE — Anesthesia Post-op Follow-up Note (Signed)
  Anesthesia Pain Follow-up Note  [facility]  Day #: 2  Date of Follow-up: 06/01/2017 Time: 1:57 PM  Last Vitals:  Vitals:   06/01/17 0551 06/01/17 0823  BP: (!) 152/76 132/61  Pulse: 88 86  Resp: 18   Temp: 37.6 C     Level of Consciousness: alert  Pain: none   Side Effects:None  Catheter Site Exam:clean  Anti-Coag Meds    Start     Dose/Rate Route Frequency Ordered Stop   06/01/17 0800  rivaroxaban (XARELTO) tablet 10 mg     10 mg Oral Daily with breakfast 05/31/17 1759     06/01/17 0000  rivaroxaban (XARELTO) 10 MG TABS tablet     10 mg Oral Daily with breakfast 06/01/17 0745         Plan: Continue current therapy of postop epidural at surgeon's request  Kaye Luoma DAVID

## 2017-06-01 NOTE — Evaluation (Signed)
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 PMH significant for HBP, GERD, OA and narcotics withdrawl.   Clinical Impression  Pt is s/p TKA resulting in the deficits listed below (see PT Problem List). Pt is minA for bed mobility, and min guard for safety with transfers to RW and ambulation of 25 feet with RW.  Pt will benefit from skilled PT to increase their independence and safety with mobility to allow discharge to SNF as arranged by surgeon.      Follow Up Recommendations DC plan and follow up therapy as arranged by surgeon    Equipment Recommendations  Other (comment) (to be determined at next venue)    Recommendations for Other Services OT consult     Precautions / Restrictions Precautions Precautions: Knee Precaution Booklet Issued: Yes (comment) Required Braces or Orthoses: Knee Immobilizer - Right Restrictions Weight Bearing Restrictions: Yes RLE Weight Bearing: Weight bearing as tolerated      Mobility  Bed Mobility Overal bed mobility: Needs Assistance Bed Mobility: Supine to Sit     Supine to sit: Min assist     General bed mobility comments: minA for LE management to floor  Transfers Overall transfer level: Needs assistance Equipment used: Rolling walker (2 wheeled) Transfers: Sit to/from Stand Sit to Stand: Min assist         General transfer comment: minA for safely lowering to recliner, vc for hand placement to lower down   Ambulation/Gait Ambulation/Gait assistance: Min guard Ambulation Distance (Feet): 25 Feet Assistive device: Rolling walker (2 wheeled) Gait Pattern/deviations: Step-to pattern;Decreased step length - right;Decreased step length - left;Decreased weight shift to right;Shuffle Gait velocity: slowed Gait velocity  interpretation: Below normal speed for age/gender General Gait Details: min guard for safety, vc for increased heel strike with R LE and for upright posture     Balance Overall balance assessment: Needs assistance Sitting-balance support: No upper extremity supported;Feet supported Sitting balance-Leahy Scale: Good     Standing balance support: No upper extremity supported;Single extremity supported Standing balance-Leahy Scale: Fair Standing balance comment: able to release from RW to fix gown                             Pertinent Vitals/Pain Pain Assessment: 0-10 Pain Score: 3  Pain Location: R knee Pain Descriptors / Indicators: Constant;Throbbing;Aching Pain Intervention(s): Limited activity within patient's tolerance;Monitored during session;Repositioned  VSS    Home Living Family/patient expects to be discharged to:: Skilled nursing facility Living Arrangements: Other relatives (lives with grandson ) Available Help at Discharge: Skilled Nursing Facility Type of Home: Apartment Home Access: Elevator;Level entry     Home Layout: One level Home Equipment: Cane - single point      Prior Function Level of Independence: Independent with assistive device(s)         Comments: using cane for functional mobility      Hand Dominance   Dominant Hand: Right    Extremity/Trunk Assessment   Upper Extremity Assessment Upper Extremity Assessment: Defer to OT evaluation    Lower Extremity Assessment Lower Extremity Assessment: RLE deficits/detail RLE Deficits / Details: R TKA, hip and knee ROM and strength limited by surgical intervention RLE: Unable to fully assess due to pain  Cervical / Trunk Assessment Cervical / Trunk Assessment: Normal  Communication   Communication: No difficulties  Cognition Arousal/Alertness: Awake/alert Behavior During Therapy: WFL for tasks assessed/performed Overall Cognitive Status: Within Functional Limits for tasks  assessed                                           Exercises Total Joint Exercises Ankle Circles/Pumps: AROM;Both;10 reps;Seated Quad Sets: AROM;Right;10 reps;Seated Heel Slides: AROM;Right;10 reps;Seated   Assessment/Plan    PT Assessment Patient needs continued PT services  PT Problem List Decreased strength;Decreased range of motion;Decreased activity tolerance;Decreased knowledge of use of DME;Pain       PT Treatment Interventions DME instruction;Gait training;Functional mobility training;Therapeutic activities;Therapeutic exercise;Balance training;Patient/family education    PT Goals (Current goals can be found in the Care Plan section)  Acute Rehab PT Goals Patient Stated Goal: go home soon PT Goal Formulation: With patient Time For Goal Achievement: 06/08/17 Potential to Achieve Goals: Good    Frequency 7X/week    AM-PAC PT "6 Clicks" Daily Activity  Outcome Measure Difficulty turning over in bed (including adjusting bedclothes, sheets and blankets)?: A Lot Difficulty moving from lying on back to sitting on the side of the bed? : Total Difficulty sitting down on and standing up from a chair with arms (e.g., wheelchair, bedside commode, etc,.)?: Total Help needed moving to and from a bed to chair (including a wheelchair)?: A Little Help needed walking in hospital room?: A Little Help needed climbing 3-5 steps with a railing? : A Lot 6 Click Score: 12    End of Session Equipment Utilized During Treatment: Gait belt;Right knee immobilizer Activity Tolerance: Patient tolerated treatment well Patient left: in chair;with call bell/phone within reach;with nursing/sitter in room Nurse Communication: Mobility status;Precautions;Weight bearing status PT Visit Diagnosis: Other abnormalities of gait and mobility (R26.89);Muscle weakness (generalized) (M62.81);Pain;Difficulty in walking, not elsewhere classified (R26.2) Pain - Right/Left: Right Pain - part of  body: Knee    Time: 1610-9604 PT Time Calculation (min) (ACUTE ONLY): 17 min   Charges:   PT Evaluation $PT Eval Low Complexity: 1 Procedure     PT G Codes:        Ajai Harville B. Beverely Risen PT, DPT Acute Rehabilitation  [phone] Pager [phone]    Elon Alas Fleet 06/01/2017, 11:18 AM

## 2017-06-01 NOTE — Evaluation (Addendum)
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 PMH significant for HBP, GERD, OA and narcotics withdrawl.    Clinical Impression   This 66 y/o F presents with the above. At baseline Pt is Mod independent with ADLs and functional mobility. Pt currently requires MinGuard assist for UB ADLs and MaxA for LB ADLs. Pt is motivated to work with therapy, will benefit from continued acute OT services and post acute OT services to maximize Pt's independence and safety with ADLs and functional mobility prior to returning home.     Follow Up Recommendations  DC plan and follow up therapy as arranged by surgeon;Supervision/Assistance - 24 hour    Equipment Recommendations  3 in 1 bedside commode;Other (comment) (if going to SNF, defer to next venue of care )           Precautions / Restrictions Precautions Precautions: Knee Precaution Booklet Issued: Yes (comment) Required Braces or Orthoses: Knee Immobilizer - Right Restrictions Weight Bearing Restrictions: Yes RLE Weight Bearing: Weight bearing as tolerated      Mobility Bed Mobility Overal bed mobility: Needs Assistance Bed Mobility: Supine to Sit     Supine to sit: Min assist;HOB elevated     General bed mobility comments: minA for LE management to floor  Transfers Overall transfer level: Needs assistance Equipment used: Rolling walker (2 wheeled) Transfers: Sit to/from Stand Sit to Stand: Min assist         General transfer comment: minA to rise, verbal cues for hand placement     Balance Overall balance assessment: Needs assistance Sitting-balance support: No upper extremity supported;Feet supported Sitting balance-Leahy Scale: Good     Standing balance support: No upper extremity supported;Single extremity  supported;During functional activity Standing balance-Leahy Scale: Fair Standing balance comment: able to release from RW to fix gown                           ADL either performed or assessed with clinical judgement   ADL Overall ADL's : Needs assistance/impaired Eating/Feeding: Independent;Sitting   Grooming: Wash/dry hands;Min guard;Standing   Upper Body Bathing: Min guard;Sitting   Lower Body Bathing: Moderate assistance;Sit to/from stand   Upper Body Dressing : Min guard;Sitting   Lower Body Dressing: Maximal assistance;Sit to/from stand   Toilet Transfer: Minimal assistance;Ambulation;RW Toilet Transfer Details (indicate cue type and reason): simluated with transfer to/from chair  Toileting- ArchitectClothing Manipulation and Hygiene: Min guard;Sit to/from stand       Functional mobility during ADLs: Minimal assistance;Rolling walker       Vision   Vision Assessment?: No apparent visual deficits                Pertinent Vitals/Pain Pain Assessment: 0-10 Pain Score: 3  Pain Location: R knee Pain Descriptors / Indicators: Constant;Throbbing;Aching Pain Intervention(s): Limited activity within patient's tolerance;Monitored during session;Repositioned     Hand Dominance Right   Extremity/Trunk Assessment Upper Extremity Assessment Upper Extremity Assessment: Overall WFL for tasks assessed   Lower Extremity Assessment Lower Extremity Assessment: Defer to PT evaluation RLE Deficits / Details: R TKA, hip and knee ROM and strength limited by surgical intervention RLE: Unable to fully assess due to pain   Cervical / Trunk Assessment Cervical / Trunk Assessment: Normal  Communication Communication Communication: No difficulties   Cognition Arousal/Alertness: Awake/alert Behavior During Therapy: WFL for tasks assessed/performed Overall Cognitive Status: Within Functional Limits for tasks assessed                                      General Comments                 Home Living Family/patient expects to be discharged to:: Skilled nursing facility Living Arrangements: Other relatives (lives with grandson) Available Help at Discharge: Skilled Nursing Facility Type of Home: Apartment Home Access: Elevator;Level entry     Home Layout: One level     Bathroom Shower/Tub: Chief Strategy Officer: Standard     Home Equipment: Cane - single point          Prior Functioning/Environment Level of Independence: Independent with assistive device(s)        Comments: using cane for functional mobility         OT Problem List: Decreased strength;Decreased activity tolerance;Impaired balance (sitting and/or standing);Decreased knowledge of use of DME or AE      OT Treatment/Interventions: Self-care/ADL training;Therapeutic exercise;DME and/or AE instruction;Therapeutic activities;Balance training;Energy conservation;Patient/family education    OT Goals(Current goals can be found in the care plan section) Acute Rehab OT Goals Patient Stated Goal: go home soon OT Goal Formulation: With patient Time For Goal Achievement: 06/15/17 Potential to Achieve Goals: Good  OT Frequency: Min 2X/week                             AM-PAC PT "6 Clicks" Daily Activity     Outcome Measure Help from another person eating meals?: A Little Help from another person taking care of personal grooming?: A Little Help from another person toileting, which includes using toliet, bedpan, or urinal?: A Little Help from another person bathing (including washing, rinsing, drying)?: A Lot Help from another person to put on and taking off regular upper body clothing?: A Little Help from another person to put on and taking off regular lower body clothing?: A Lot 6 Click Score: 16   End of Session Equipment Utilized During Treatment: Gait belt;Rolling walker;Right knee immobilizer CPM Right Knee CPM Right Knee:  Off  Activity Tolerance: Patient tolerated treatment well Patient left: Other (comment) (passed off to PT in Pt's room)  OT Visit Diagnosis: Unsteadiness on feet (R26.81);Muscle weakness (generalized) (M62.81)                Time: 1015-1040 OT Time Calculation (min): 25 min Charges:  OT General Charges $OT Visit: 1 Procedure OT Evaluation $OT Eval Low Complexity: 1 Procedure G-Codes:     Gloria Patterson, OT Pager [phone] 06/01/2017   Gloria Patterson 06/01/2017, 12:42 PM

## 2017-06-01 NOTE — Social Work (Signed)
Patient will need a hard prescription for Methadone prior to DC to SNF.  Patient takes this medication at home and will need while in rehabilitation. CSW follow-up with nurse, who will follow-up with doctor.  Patient had bed offers to review.  [facility] has declined to offer a SNF bed.

## 2017-06-01 NOTE — Discharge Instructions (Addendum)
Total Knee Replacement, Care After °Refer to this sheet in the next few weeks. These instructions provide you with information about caring for yourself after your procedure. Your health care provider may also give you more specific instructions. Your treatment has been planned according to current medical practices, but problems sometimes occur. Call your health care provider if you have any problems or questions after your procedure. °What can I expect after the procedure? °After the procedure, it is common to have: °· Pain and swelling. °· A small amount of blood or clear fluid coming from your incision. °· Limited range of motion. °Follow these instructions at home: °Medicines  °· Take over-the-counter and prescription medicines only as told by your health care provider. °· If you were prescribed an antibiotic medicine, take it as told by your health care provider. Do not stop taking the antibiotic even if you start to feel better. °· If you were prescribed a blood thinner (anticoagulant), take it as told by your health care provider. °If you have a splint or brace:  °· Wear the immobilizer as told by your health care provider. Remove it only as told by your health care provider. °· Loosen the immobilizer if your toes tingle, become numb, or turn cold and blue. °· Do not let your immobilizer get wet if it is not waterproof. °· Keep the immobilizer clean. °Bathing  ° °· Do not take baths, swim, or use a hot tub until your health care provider approves. Ask your health care provider if you can take showers. You may only be allowed to take sponge baths for bathing. °· If you have an immobilizer that is not waterproof, cover it with a watertight covering when you take a bath or shower. °· Keep your bandage (dressing) dry until your health care provider says it can be removed. °Incision care and drain care  °· Check your incision area and drain site every day for signs of infection. Check for: °¨ More redness,  swelling, or pain. °¨ More fluid or blood. °¨ Warmth. °¨ Pus or a bad smell. °· Follow instructions from your health care provider about how to take care of your incision. Make sure you: °¨ Wash your hands with soap and water before you change your dressing. If soap and water are not available, use hand sanitizer. °¨ Change your dressing as told by your health care provider. °¨ Leave stitches (sutures), skin glue, or adhesive strips in place. These skin closures may need to stay in place for 2 weeks or longer. If adhesive strip edges start to loosen and curl up, you may trim the loose edges. Do not remove adhesive strips completely unless your health care provider tells you to do that. °· If you have a drain, follow instructions from your health care provider about caring for it. Do not remove the drain tube or any dressings around the tube opening unless your health care provider approves. °Managing pain, stiffness, and swelling  °· If directed, put ice on your knee. °¨ Put ice in a plastic bag. °¨ Place a towel between your skin and the bag. °¨ Leave the ice on for 20 minutes, 2-3 times per day. °· If directed, apply heat to the affected area as often as told by your health care provider. Use the heat source that your health care provider recommends, such as a moist heat pack or a heating pad. °¨ Place a towel between your skin and the heat source. °¨ Leave the heat on   for 20-30 minutes. °¨ Remove the heat if your skin turns bright red. This is especially important if you are unable to feel pain, heat, or cold. You may have a greater risk of getting burned. °· Move your toes often to avoid stiffness and to lessen swelling. °· Raise (elevate) your knee above the level of your heart while you are sitting or lying down. °· Wear elastic knee support for as long as told by your health care provider. °Driving  ° °· Do not drive until your health care provider approves. Ask your health care provider when it is safe to  drive if you have an immobilizer on your knee. °· Do not drive or operate heavy machinery while taking prescription pain medicine. °· Do not drive for 24 hours if you received a sedative. °Activity  °· Do not lift anything that is heavier than 10 lb (4.5 kg) until your health care provider approves. °· Do not play contact sports until your health care provider approves. °· Avoid high-impact activities, including running, jumping rope, and jumping jacks. °· Avoid sitting for a long time without moving. Get up and move around at least every few hours. °· If physical therapy was prescribed, do exercises as told by your health care provider. °· Return to your normal activities as told by your health care provider. Ask your health care provider what activities are safe for you. °Safety  °· Do not use your leg to support your body weight until your health care provider approves. Use crutches or a walker as told by your health care provider. °General instructions  ° °· Do not have any dental work done for at least 3 months after your surgery. When you do have dental work done, tell your dentist about your joint replacement. °· Do not use any tobacco products, such as cigarettes, chewing tobacco, or e-cigarettes. If you need help quitting, ask your health care provider. °· Wear compression stockings as told by your health care provider. These stockings help to prevent blood clots and reduce swelling in your legs. °· If you have been sent home with a continuous passive motion machine, use it as told by your health care provider. °· Drink enough fluid to keep your urine clear or pale yellow. °· If you have been instructed to lose weight, follow instructions from your health care provider about how to do this safely. °· Keep all follow-up visits as told by your health care provider. This is important. °Contact a health care provider if: °· You have more redness, swelling, or pain around your incision or drain. °· You have more  fluid or blood coming from your incision or drain. °· Your incision or drain site feels warm to the touch. °· You have pus or a bad smell coming from your incision or drain. °· You have a fever. °· Your incision breaks open after your health care provider removes your sutures, skin glue, or adhesive tape. °· Your prosthesis feels loose. °· You have knee pain that does not go away. °Get help right away if: °· You have a rash. °· You have pain or swelling in your calf or thigh. °· You have shortness of breath or difficulty breathing. °· You have chest pain. °· Your range of motion in your knee is getting worse. °This information is not intended to replace advice given to you by your health care provider. Make sure you discuss any questions you have with your health care provider. °Document Released: 06/23/2005 Document   Revised: 08/07/2016 Document Reviewed: 11/10/2015 °Elsevier Interactive Patient Education © 2017 [employer] °Information on my medicine - XARELTO® (Rivaroxaban) ° °This medication education was reviewed with me or my healthcare representative as part of my discharge preparation.  The pharmacist that spoke with me during my hospital stay was:  Dang, Thuy Dien, RPH ° °Why was Xarelto® prescribed for you? °Xarelto® was prescribed for you to reduce the risk of blood clots forming after orthopedic surgery. The medical term for these abnormal blood clots is venous thromboembolism (VTE). ° °What do you need to know about xarelto® ? °Take your Xarelto® ONCE DAILY at the same time every day. °You may take it either with or without food. ° °If you have difficulty swallowing the tablet whole, you may crush it and mix in applesauce just prior to taking your dose. ° °Take Xarelto® exactly as prescribed by your doctor and DO NOT stop taking Xarelto® without talking to the doctor who prescribed the medication.  Stopping without other VTE prevention medication to take the place of Xarelto® may increase your risk of  developing a clot. ° °After discharge, you should have regular check-up appointments with your healthcare provider that is prescribing your Xarelto®.   ° °What do you do if you miss a dose? °If you miss a dose, take it as soon as you remember on the same day then continue your regularly scheduled once daily regimen the next day. Do not take two doses of Xarelto® on the same day.  ° °Important Safety Information °A possible side effect of Xarelto® is bleeding. You should call your healthcare provider right away if you experience any of the following: °? Bleeding from an injury or your nose that does not stop. °? Unusual colored urine (red or dark brown) or unusual colored stools (red or black). °? Unusual bruising for unknown reasons. °? A serious fall or if you hit your head (even if there is no bleeding). ° °Some medicines may interact with Xarelto® and might increase your risk of bleeding while on Xarelto®. To help avoid this, consult your healthcare provider or pharmacist prior to using any new prescription or non-prescription medications, including herbals, vitamins, non-steroidal anti-inflammatory drugs (NSAIDs) and supplements. ° °This website has more information on Xarelto®: www.xarelto.com. ° ° °

## 2017-06-01 NOTE — Progress Notes (Signed)
Subjective: Pt stable - pain currently controlled   Objective: Vital signs in last 24 hours: Temp:  [97 F (36.1 C)-99.6 F (37.6 C)] 99.6 F (37.6 C) (06/15 0551) Pulse Rate:  [59-88] 88 (06/15 0551) Resp:  [0-18] 18 (06/15 0551) BP: (148-174)/(63-84) 152/76 (06/15 0551) SpO2:  [93 %-100 %] 99 % (06/15 0551) Weight:  [165 lb 5.5 oz (75 kg)-166 lb (75.3 kg)] 165 lb 5.5 oz (75 kg) (06/15 0100)  Intake/Output from previous day: 06/14 0701 - 06/15 0700 In: 2953.8 [P.O.:860; I.V.:2093.8] Out: 2475 [Urine:2400; Blood:75] Intake/Output this shift: No intake/output data recorded.  Exam:  Neurovascular intact Sensation intact distally Dorsiflexion/Plantar flexion intact  Labs: No results for input(s): HGB in the last 72 hours. No results for input(s): WBC, RBC, HCT, PLT in the last 72 hours. No results for input(s): NA, K, CL, CO2, BUN, CREATININE, GLUCOSE, CALCIUM in the last 72 hours. No results for input(s): LABPT, INR in the last 72 hours.  Assessment/Plan: Plan cpm and pt today - would be good to keep the femoral catheter in today and then discontinue it tomorrow.  Plan potential transfer to skilled nursing tomorrow.   Gloria Patterson Scott Dot Splinter 06/01/2017, 7:40 AM

## 2017-06-01 NOTE — Discharge Summary (Signed)
Physician Discharge Summary  Patient ID: Gloria Patterson Radler MRN: [id number] DOB/AGE: 04-12-1951 66 y.o.  Admit date: 05/31/2017 Discharge date: 06/02/2017  Admission Diagnoses:  Active Problems:   Arthritis of knee   Discharge Diagnoses:  Same  Surgeries: Procedure(s): RIGHT TOTAL KNEE ARTHROPLASTY on 05/31/2017   Consultants:   Discharged Condition: Stable  Hospital Course: Gloria Patterson Antonelli is an 66 y.o. female who was admitted 05/31/2017 with a chief complaint of right knee pain, and found to have a diagnosis of right knee arthritis.  They were brought to the operating room on 05/31/2017 and underwent the above named procedures.  Patient tolerated the procedure well.  She used a femoral catheter for adequate pain control for the first 2 days postoperatively.  She was started on Xarelto for DVT prophylaxis.  She made good progress with physical therapy ambulating.  She is discharged in good condition to skilled nursing facility.  She will stop her narcotics here in the hospital prior to discharge and resume her daily dose of methadone at the skilled nursing facility.  She will follow-up with me approximately 12 days after discharge  Antibiotics given:  Anti-infectives    Start     Dose/Rate Route Frequency Ordered Stop   05/31/17 2000  ceFAZolin (ANCEF) IVPB 2g/100 mL premix     2 g 200 mL/hr over 30 Minutes Intravenous Every 6 hours 05/31/17 1759 06/01/17 0130   05/31/17 1200  ceFAZolin (ANCEF) IVPB 2g/100 mL premix     2 g 200 mL/hr over 30 Minutes Intravenous To [facility] 05/30/17 0730 05/31/17 1341    .  Recent vital signs:  Vitals:   06/01/17 0452 06/01/17 0551  BP:  (!) 152/76  Pulse:  88  Resp: 18 18  Temp:  99.6 F (37.6 C)    Recent laboratory studies:  Results for orders placed or performed during the hospital encounter of 05/31/17  Glucose, capillary  Result Value Ref Range   Glucose-Capillary 101 (H) 65 - 99 mg/dL   Comment 1 Notify RN    Comment 2 Document in Chart   Glucose, capillary  Result Value Ref Range   Glucose-Capillary 62 (L) 65 - 99 mg/dL   Comment 1 Notify RN   Glucose, capillary  Result Value Ref Range   Glucose-Capillary 86 65 - 99 mg/dL  Glucose, capillary  Result Value Ref Range   Glucose-Capillary 123 (H) 65 - 99 mg/dL  Glucose, capillary  Result Value Ref Range   Glucose-Capillary 160 (H) 65 - 99 mg/dL    Discharge Medications:   Allergies as of 06/01/2017      Reactions   Phenobarbital Other (See Comments)    ? SEIZURES ?  [ QUESTION IF SZs RESULT FROM PHENOBARB ? ]       Medication List    STOP taking these medications   famotidine 20 MG tablet Commonly known as:  PEPCID   meloxicam 15 MG tablet Commonly known as:  MOBIC     TAKE these medications   atorvastatin 20 MG tablet Commonly known as:  LIPITOR Take 20 mg by mouth at bedtime.   cloNIDine 0.1 MG tablet Commonly known as:  CATAPRES Take 1 tablet (0.1 mg total) by mouth 2 (two) times daily.   ESTROVEN PO Take 1 tablet by mouth daily.   gabapentin 100 MG capsule Commonly known as:  NEURONTIN TAKE 1 CAPSULE (100 MG TOTAL) BY MOUTH 3 (THREE) TIMES DAILY.   metFORMIN 500 MG tablet Commonly known as:  GLUCOPHAGE TAKE  1 TABLET BY MOUTH EVERY DAY WITH FOOD - OFFICE VISIT BEFORE NEXT REFILL   METHADONE HCL PO Take 88 mg by mouth daily. Liquid unit doses from ADS [phone].   methocarbamol 500 MG tablet Commonly known as:  ROBAXIN Take 1 tablet (500 mg total) by mouth every 6 (six) hours as needed for muscle spasms.   multivitamin with minerals Tabs tablet Take 1 tablet by mouth daily.   omeprazole 40 MG capsule Commonly known as:  PRILOSEC Take 1 capsule (40 mg total) by mouth daily.   polyethylene glycol packet Commonly known as:  MIRALAX / GLYCOLAX Take 17 g by mouth daily as needed.   PROVENTIL HFA 108 (90 Base) MCG/ACT inhaler Generic drug:  albuterol Inhale 2 puffs into the lungs every 4 (four) hours as  needed for wheezing or shortness of breath.   rivaroxaban 10 MG Tabs tablet Commonly known as:  XARELTO Take 1 tablet (10 mg total) by mouth daily with breakfast.   traMADol 50 MG tablet Commonly known as:  ULTRAM Take 1 tablet (50 mg total) by mouth 2 (two) times daily. What changed:  when to take this  reasons to take this   triamcinolone cream 0.1 % Commonly known as:  KENALOG Apply 1 application topically daily as needed for itching or rash.       Diagnostic Studies: Dg Chest 2 View  Result Date: 05/25/2017 CLINICAL DATA:  Pre right knee total arthroplasty evaluation. EXAM: CHEST  2 VIEW COMPARISON:  09/03/2013. FINDINGS: Normal sized heart. Stable linear scarring in the left lower lung zone. Clear right lung. Central peribronchial thickening without significant change. Minimal scoliosis and minimal thoracic spine degenerative changes. IMPRESSION: No acute abnormality. Stable mild to moderate chronic bronchitic changes. Electronically Signed   By: Beckie Salts M.D.   On: 05/25/2017 14:59   Xr Knee 3 View Right  Result Date: 05/04/2017 AP lateral right knee reviewed.  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  Slight varus alignment is present.  Total knee replacement and plate on the femur in good position on the left knee.  Posterior spurring present.   Disposition: 03-Skilled Nursing Facility  Discharge Instructions    Call MD / Call 911    Complete by:  As directed    If you experience chest pain or shortness of breath, CALL 911 and be transported to the hospital emergency room.  If you develope a fever above 101 F, pus (white drainage) or increased drainage or redness at the wound, or calf pain, call your surgeon's office.   Constipation Prevention    Complete by:  As directed    Drink plenty of fluids.  Prune juice may be helpful.  You may use a stool softener, such as Colace (over the counter) 100 mg twice a day.  Use MiraLax (over  the counter) for constipation as needed.   Diet - low sodium heart healthy    Complete by:  As directed    Discharge instructions    Complete by:  As directed    Weightbearing as tolerated in knee immobilizer CPM machine 3 hours per day minimum Work on full extension and strengthening Okay to remove dressing next Friday and rewrap knee with Ace wrap Okay to shower in current dressing   Increase activity slowly as tolerated    Complete by:  As directed          Signed: Burnard Bunting 06/01/2017, 7:46 AM

## 2017-06-02 ENCOUNTER — Emergency Department (HOSPITAL_COMMUNITY): Payer: Medicare Other

## 2017-06-02 ENCOUNTER — Inpatient Hospital Stay (HOSPITAL_COMMUNITY)
Admission: EM | Admit: 2017-06-02 | Discharge: 2017-06-05 | DRG: 871 | Disposition: A | Discharge status: Skilled Nursing Facility | Payer: Medicare Other | Attending: Internal Medicine | Admitting: Internal Medicine

## 2017-06-02 ENCOUNTER — Encounter (HOSPITAL_COMMUNITY): Payer: Self-pay | Admitting: Orthopedic Surgery

## 2017-06-02 DIAGNOSIS — Y95 Nosocomial condition: Secondary | ICD-10-CM | POA: Diagnosis present

## 2017-06-02 DIAGNOSIS — Z96651 Presence of right artificial knee joint: Secondary | ICD-10-CM

## 2017-06-02 DIAGNOSIS — M19019 Primary osteoarthritis, unspecified shoulder: Secondary | ICD-10-CM | POA: Diagnosis present

## 2017-06-02 DIAGNOSIS — F1193 Opioid use, unspecified with withdrawal: Secondary | ICD-10-CM | POA: Diagnosis present

## 2017-06-02 DIAGNOSIS — I10 Essential (primary) hypertension: Secondary | ICD-10-CM | POA: Diagnosis present

## 2017-06-02 DIAGNOSIS — R569 Unspecified convulsions: Secondary | ICD-10-CM | POA: Diagnosis present

## 2017-06-02 DIAGNOSIS — Z8619 Personal history of other infectious and parasitic diseases: Secondary | ICD-10-CM

## 2017-06-02 DIAGNOSIS — E119 Type 2 diabetes mellitus without complications: Secondary | ICD-10-CM | POA: Diagnosis present

## 2017-06-02 DIAGNOSIS — Z7984 Long term (current) use of oral hypoglycemic drugs: Secondary | ICD-10-CM

## 2017-06-02 DIAGNOSIS — R509 Fever, unspecified: Secondary | ICD-10-CM

## 2017-06-02 DIAGNOSIS — Z7901 Long term (current) use of anticoagulants: Secondary | ICD-10-CM

## 2017-06-02 DIAGNOSIS — K219 Gastro-esophageal reflux disease without esophagitis: Secondary | ICD-10-CM | POA: Diagnosis present

## 2017-06-02 DIAGNOSIS — Z96653 Presence of artificial knee joint, bilateral: Secondary | ICD-10-CM | POA: Diagnosis present

## 2017-06-02 DIAGNOSIS — Z888 Allergy status to other drugs, medicaments and biological substances status: Secondary | ICD-10-CM

## 2017-06-02 DIAGNOSIS — J45909 Unspecified asthma, uncomplicated: Secondary | ICD-10-CM | POA: Diagnosis present

## 2017-06-02 DIAGNOSIS — A419 Sepsis, unspecified organism: Principal | ICD-10-CM | POA: Diagnosis present

## 2017-06-02 DIAGNOSIS — J189 Pneumonia, unspecified organism: Secondary | ICD-10-CM | POA: Diagnosis present

## 2017-06-02 DIAGNOSIS — F1123 Opioid dependence with withdrawal: Secondary | ICD-10-CM | POA: Diagnosis present

## 2017-06-02 LAB — URINALYSIS, ROUTINE W REFLEX MICROSCOPIC
BILIRUBIN URINE: NEGATIVE
Glucose, UA: NEGATIVE mg/dL
Hgb urine dipstick: NEGATIVE
KETONES UR: NEGATIVE mg/dL
Leukocytes, UA: NEGATIVE
NITRITE: NEGATIVE
PROTEIN: NEGATIVE mg/dL
SPECIFIC GRAVITY, URINE: 1.012 (ref 1.005–1.030)
pH: 7 (ref 5.0–8.0)

## 2017-06-02 LAB — CBC WITH DIFFERENTIAL/PLATELET
BASOS PCT: 0 %
Basophils Absolute: 0 10*3/uL (ref 0.0–0.1)
EOS ABS: 0 10*3/uL (ref 0.0–0.7)
Eosinophils Relative: 0 %
HCT: 33.2 % — ABNORMAL LOW (ref 36.0–46.0)
HEMOGLOBIN: 11 g/dL — AB (ref 12.0–15.0)
Lymphocytes Relative: 26 %
Lymphs Abs: 4.2 10*3/uL — ABNORMAL HIGH (ref 0.7–4.0)
MCH: 30.8 pg (ref 26.0–34.0)
MCHC: 33.1 g/dL (ref 30.0–36.0)
MCV: 93 fL (ref 78.0–100.0)
MONOS PCT: 10 %
Monocytes Absolute: 1.6 10*3/uL — ABNORMAL HIGH (ref 0.1–1.0)
NEUTROS PCT: 64 %
Neutro Abs: 10.4 10*3/uL — ABNORMAL HIGH (ref 1.7–7.7)
PLATELETS: 209 10*3/uL (ref 150–400)
RBC: 3.57 MIL/uL — AB (ref 3.87–5.11)
RDW: 13.7 % (ref 11.5–15.5)
WBC: 16.2 10*3/uL — AB (ref 4.0–10.5)

## 2017-06-02 LAB — BASIC METABOLIC PANEL
Anion gap: 7 (ref 5–15)
BUN: 7 mg/dL (ref 6–20)
CHLORIDE: 100 mmol/L — AB (ref 101–111)
CO2: 26 mmol/L (ref 22–32)
CREATININE: 0.77 mg/dL (ref 0.44–1.00)
Calcium: 8.5 mg/dL — ABNORMAL LOW (ref 8.9–10.3)
GFR calc non Af Amer: >60 mL/min (ref >60)
Glucose, Bld: 120 mg/dL — ABNORMAL HIGH (ref 65–99)
Potassium: 4.9 mmol/L (ref 3.5–5.1)
SODIUM: 133 mmol/L — AB (ref 135–145)

## 2017-06-02 LAB — GLUCOSE, CAPILLARY
GLUCOSE-CAPILLARY: 88 mg/dL (ref 65–99)
GLUCOSE-CAPILLARY: 134 mg/dL — AB (ref 65–99)

## 2017-06-02 LAB — I-STAT CG4 LACTIC ACID, ED: Lactic Acid, Venous: 1.43 mmol/L (ref 0.5–1.9)

## 2017-06-02 MED ORDER — SODIUM CHLORIDE 0.9 % IV BOLUS (SEPSIS)
1000.0000 mL | Freq: Once | INTRAVENOUS | Status: AC
  Administered 2017-06-02: 1000 mL via INTRAVENOUS

## 2017-06-02 MED ORDER — HYDROMORPHONE HCL 1 MG/ML IJ SOLN: 1.0000 mg | Freq: Once | INTRAMUSCULAR | Status: DC

## 2017-06-02 MED ORDER — CEFEPIME HCL 2 G IJ SOLR
2.0000 g | Freq: Once | INTRAMUSCULAR | Status: AC
  Administered 2017-06-03: 2 g via INTRAVENOUS
  Filled 2017-06-02: qty 2

## 2017-06-02 MED ORDER — ACETAMINOPHEN 500 MG PO TABS
1000.0000 mg | ORAL_TABLET | Freq: Once | ORAL | Status: AC
  Administered 2017-06-02: 1000 mg via ORAL
  Filled 2017-06-02: qty 2

## 2017-06-02 MED ORDER — HYDROMORPHONE HCL 1 MG/ML IJ SOLN
1.0000 mg | Freq: Once | INTRAMUSCULAR | Status: AC
  Administered 2017-06-02: 1 mg via INTRAVENOUS
  Filled 2017-06-02: qty 1

## 2017-06-02 MED ORDER — LORAZEPAM 2 MG/ML IJ SOLN
1.0000 mg | Freq: Once | INTRAMUSCULAR | Status: AC
  Administered 2017-06-02: 1 mg via INTRAVENOUS
  Filled 2017-06-02: qty 1

## 2017-06-02 MED ORDER — VANCOMYCIN HCL IN DEXTROSE 1-5 GM/200ML-% IV SOLN
1000.0000 mg | Freq: Once | INTRAVENOUS | Status: AC
  Administered 2017-06-03: 1000 mg via INTRAVENOUS
  Filled 2017-06-02: qty 200

## 2017-06-02 NOTE — ED Provider Notes (Addendum)
[facility]-EMERGENCY DEPT Provider Note   CSN: [id number] Arrival date & time: 06/02/17  1957     History   Chief Complaint No chief complaint on file.   HPI Gloria Patterson is a 66 y.o. female.  Patient c/o right leg pain s/p right TKA 6/14.  Pt w remote hx opiate abuse, has been on methadone.  In hospital was not getting methadone, and had pain meds and fem block d/c'd today.  Pt was sent to ECF, but on arriving there, pt was c/o pain in knee so they sent to ED.  Pt c/o of not having anything for pain today. Denies leg numbness/weakness. No increased swelling. No redness. Pt denies fever/chills.    The history is provided by the patient.    Past Medical History:  Diagnosis Date  . Arthritis    knee, shouder  . Asthma    MILD  . Chronic cough   . Constipation   . Diabetes mellitus without complication (HCC)    Type II  . Frequency of urination   . GERD (gastroesophageal reflux disease)   . Hepatitis    Hepatitis C treated   . 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 History of kidney stones    passed  . Hypertension   . Nocturia   . 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  Patient not sure why she was given Phenobarital,   " before 1978"  . Urgency of urination   . Vaginal vault prolapse    ANTERIOR    Patient Active Problem List   Diagnosis Date Noted  . Arthritis of knee 05/31/2017  . HBP (high blood pressure) 08/25/2013  . GERD (gastroesophageal reflux disease) 05/23/2013  . Narcotic withdrawal (HCC) 05/23/2013  . Acute bronchitis 05/23/2013  . Osteoarthritis of left knee 04/08/2013    Class: Diagnosis of  . Upper airway cough syndrome 10/16/2012    Past Surgical History:  Procedure Laterality Date  . ANTERIOR AND POSTERIOR REPAIR N/A 03/10/2013   Procedure: ANTERIOR (CYSTOCELE) AND POSTERIOR REPAIR  (RECTOCELE);  Surgeon: Kathi LudwigSigmund I Tannenbaum, MD;  [facility] [place];  Service: Urology;  Laterality: N/A;   [employer] SCI[employer] UPHOLD LITE ANTERIOR VAULT REPAIR  POSSIBLE OP WITH OBSERVATION POSSIBLE FLOOR BED  Uphold LITE w/Capio SLIM Catalog number [id number] qty 2 Xenform soft tissue repair matrix 6x7 cm [id number]   . BREAST REDUCTION SURGERY    . BREAST SURGERY     B/L removal of benign cysts  . COLONOSCOPY W/ POLYPECTOMY    . CYSTOCELE REPAIR N/A 03/10/2013   Procedure:  Anterior vaginal vault prolapse repair, with [employer] uphold light sacrospinous fixation using U. device and mesh repair with Tresa EndoKelly plication   ;  Surgeon: Kathi LudwigSigmund I Tannenbaum, MD;  [facility] [place];  Service: Urology;  Laterality: N/A;  . ESOPHAGOGASTRODUODENOSCOPY    . TOTAL KNEE ARTHROPLASTY Left 04/08/2013   Procedure: TOTAL KNEE ARTHROPLASTY;  Surgeon: Cammy CopaGregory Scott Dean, MD;  [facility];  Service: Orthopedics;  Laterality: Left;  Left total knee arthroplasty  . TOTAL KNEE ARTHROPLASTY Right 05/31/2017   Procedure: RIGHT TOTAL KNEE ARTHROPLASTY;  Surgeon: Cammy Copaean, Scott Gregory, MD;  [facility];  Service: Orthopedics;  Laterality: Right;  . TUBAL LIGATION  OB History    No data available       Home Medications    Prior to Admission medications   Medication Sig Start Date End Date Taking? Authorizing Provider  albuterol (PROVENTIL HFA) 108 (90 BASE) MCG/ACT inhaler Inhale 2 puffs into the lungs every 4 (four) hours as needed for wheezing or shortness of breath.     [provider]  atorvastatin (LIPITOR) 20 MG tablet Take 20 mg by mouth at bedtime.    [provider]  cloNIDine (CATAPRES) 0.1 MG tablet Take 1 tablet (0.1 mg total) by mouth 2 (two) times daily. 08/25/13   Nyoka Cowden, MD  gabapentin (NEURONTIN) 100 MG capsule TAKE 1 CAPSULE (100 MG TOTAL) BY MOUTH 3 (THREE) TIMES DAILY. Patient not taking: Reported on 05/24/2017  06/04/14   Nyoka Cowden, MD  metFORMIN (GLUCOPHAGE) 500 MG tablet TAKE 1 TABLET BY MOUTH EVERY DAY WITH FOOD - OFFICE VISIT BEFORE NEXT REFILL 04/16/17   [provider]  METHADONE HCL PO Take 88 mg by mouth daily. Liquid unit doses from ADS [phone].    [provider]  methocarbamol (ROBAXIN) 500 MG tablet Take 1 tablet (500 mg total) by mouth every 6 (six) hours as needed for muscle spasms. 06/01/17   Cammy Copa, MD  Multiple Vitamin (MULTIVITAMIN WITH MINERALS) TABS tablet Take 1 tablet by mouth daily.    [provider]  Nutritional Supplements (ESTROVEN PO) Take 1 tablet by mouth daily.    [provider]  omeprazole (PRILOSEC) 40 MG capsule Take 1 capsule (40 mg total) by mouth daily. 11/25/13   Nyoka Cowden, MD  polyethylene glycol (MIRALAX / GLYCOLAX) packet Take 17 g by mouth daily as needed.    [provider]  rivaroxaban (XARELTO) 10 MG TABS tablet Take 1 tablet (10 mg total) by mouth daily with breakfast. 06/01/17   Cammy Copa, MD  traMADol (ULTRAM) 50 MG tablet Take 1 tablet (50 mg total) by mouth 2 (two) times daily. Patient taking differently: Take 50 mg by mouth 2 (two) times daily as needed (pain).  05/16/17   Cammy Copa, MD  triamcinolone cream (KENALOG) 0.1 % Apply 1 application topically daily as needed for itching or rash. 02/21/17   [provider]    Family History Family History  Problem Relation Age of Onset  . Prostate cancer Father   . Breast cancer Sister     Social History Social History  Substance Use Topics  . Smoking status: Never Smoker  . Smokeless tobacco: Never Used     Comment: CANNOT HAVE NARCOTIC MEDICATIONS PER PT  . Alcohol use No     Allergies   Phenobarbital   Review of Systems Review of Systems  Constitutional: Negative for chills and fever.  Eyes: Negative for redness.  Respiratory: Negative for cough and shortness of breath.   Cardiovascular:  Negative for chest pain.  Gastrointestinal: Negative for abdominal pain.  Endocrine: Negative for polyuria.  Genitourinary: Negative for dysuria and flank pain.  Musculoskeletal: Negative for back pain.  Skin: Negative for rash.  Neurological: Negative for headaches.  Hematological: Does not bruise/bleed easily.  Psychiatric/Behavioral: Negative for confusion.     Physical Exam Updated Vital Signs SpO2 95%   Physical Exam  Constitutional: She appears well-developed and well-nourished.  Pt tearful, crying, anxious.   HENT:  Head: Atraumatic.  Eyes: Conjunctivae are normal. No scleral icterus.  Neck: Neck supple. No tracheal deviation present.  Cardiovascular: Normal rate  and intact distal pulses.   Pulmonary/Chest: Effort normal. No respiratory distress. She has rales.  Abdominal: Normal appearance. She exhibits no distension. There is no tenderness.  Genitourinary:  Genitourinary Comments: No cva tenderness  Musculoskeletal: She exhibits no edema.  Right knee with original post surgery dressing intact. About knee there is mild warmth, and slight erythema to skin, felt most c/w post operative change - no purulence noted or definite cellulitis to skin. w mild/passive rom knee, no exquisite pain.   Neurological: She is alert.  Skin: Skin is warm and dry. No rash noted.  Psychiatric:  Very anxious.   Nursing note and vitals reviewed.    ED Treatments / Results  Labs (all labs ordered are listed, but only abnormal results are displayed) Results for orders placed or performed during the hospital encounter of 06/02/17  CBC with Differential/Platelet  Result Value Ref Range   WBC 16.2 (H) 4.0 - 10.5 K/uL   RBC 3.57 (L) 3.87 - 5.11 MIL/uL   Hemoglobin 11.0 (L) 12.0 - 15.0 g/dL   HCT 54.0 (L) 98.1 - 19.1 %   MCV 93.0 78.0 - 100.0 fL   MCH 30.8 26.0 - 34.0 pg   MCHC 33.1 30.0 - 36.0 g/dL   RDW 47.8 29.5 - 62.1 %   Platelets 209 150 - 400 K/uL   Neutrophils Relative % 64 %    Neutro Abs 10.4 (H) 1.7 - 7.7 K/uL   Lymphocytes Relative 26 %   Lymphs Abs 4.2 (H) 0.7 - 4.0 K/uL   Monocytes Relative 10 %   Monocytes Absolute 1.6 (H) 0.1 - 1.0 K/uL   Eosinophils Relative 0 %   Eosinophils Absolute 0.0 0.0 - 0.7 K/uL   Basophils Relative 0 %   Basophils Absolute 0.0 0.0 - 0.1 K/uL  Basic metabolic panel  Result Value Ref Range   Sodium 133 (L) 135 - 145 mmol/L   Potassium 4.9 3.5 - 5.1 mmol/L   Chloride 100 (L) 101 - 111 mmol/L   CO2 26 22 - 32 mmol/L   Glucose, Bld 120 (H) 65 - 99 mg/dL   BUN 7 6 - 20 mg/dL   Creatinine, Ser 3.08 0.44 - 1.00 mg/dL   Calcium 8.5 (L) 8.9 - 10.3 mg/dL   GFR calc non Af Amer >60 >60 mL/min   GFR calc Af Amer >60 >60 mL/min   Anion gap 7 5 - 15  Urinalysis, Routine w reflex microscopic  Result Value Ref Range   Color, Urine YELLOW YELLOW   APPearance CLEAR CLEAR   Specific Gravity, Urine 1.012 1.005 - 1.030   pH 7.0 5.0 - 8.0   Glucose, UA NEGATIVE NEGATIVE mg/dL   Hgb urine dipstick NEGATIVE NEGATIVE   Bilirubin Urine NEGATIVE NEGATIVE   Ketones, ur NEGATIVE NEGATIVE mg/dL   Protein, ur NEGATIVE NEGATIVE mg/dL   Nitrite NEGATIVE NEGATIVE   Leukocytes, UA NEGATIVE NEGATIVE  I-Stat CG4 Lactic Acid, ED  Result Value Ref Range   Lactic Acid, Venous 1.43 0.5 - 1.9 mmol/L   Dg Chest 2 View  Result Date: 05/25/2017 CLINICAL DATA:  Pre right knee total arthroplasty evaluation. EXAM: CHEST  2 VIEW COMPARISON:  09/03/2013. FINDINGS: Normal sized heart. Stable linear scarring in the left lower lung zone. Clear right lung. Central peribronchial thickening without significant change. Minimal scoliosis and minimal thoracic spine degenerative changes. IMPRESSION: No acute abnormality. Stable mild to moderate chronic bronchitic changes. Electronically Signed   By: Beckie Salts M.D.   On: 05/25/2017 14:59  Dg Chest Port 1 View  Result Date: 06/02/2017 CLINICAL DATA:  65 y/o  F; fever. EXAM: PORTABLE CHEST 1 VIEW COMPARISON:  05/25/2017  chest radiograph FINDINGS: Stable normal cardiac silhouette. Right cardiophrenic angle opacity may represent an area of consolidation. No pleural effusion or pneumothorax identified. Bones are unremarkable. IMPRESSION: Right cardiophrenic angle opacity may represent an area of consolidation. Consider PA and lateral radiographs for better characterization. Electronically Signed   By: Mitzi Hansen M.D.   On: 06/02/2017 20:49   Xr Knee 3 View Right  Result Date: 05/04/2017 AP lateral right knee reviewed.  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  Slight varus alignment is present.  Total knee replacement and plate on the femur in good position on the left knee.  Posterior spurring present.   EKG  EKG Interpretation None       Radiology Dg Chest Port 1 View  Result Date: 06/02/2017 CLINICAL DATA:  66 y/o  F; fever. EXAM: PORTABLE CHEST 1 VIEW COMPARISON:  05/25/2017 chest radiograph FINDINGS: Stable normal cardiac silhouette. Right cardiophrenic angle opacity may represent an area of consolidation. No pleural effusion or pneumothorax identified. Bones are unremarkable. IMPRESSION: Right cardiophrenic angle opacity may represent an area of consolidation. Consider PA and lateral radiographs for better characterization. Electronically Signed   By: Mitzi Hansen M.D.   On: 06/02/2017 20:49    Procedures Procedures (including critical care time)  Medications Ordered in ED Medications  HYDROmorphone (DILAUDID) injection 1 mg (not administered)     Initial Impression / Assessment and Plan / ED Course  I have reviewed the triage vital signs and the nursing notes.  Pertinent labs & imaging results that were available during my care of the patient were reviewed by me and considered in my medical decision making (see chart for details).  Iv ns. Labs.   Reviewed nursing notes and prior charts for additional history.  On review charts,  pt with fever 101.5 yesterday while inpatient, and current temp is also elevated 103.   Denies cough. Denies gu c/o.  Will workup fever.   Dilaudid 1 mg iv for pain.   Pain persists.   Pts difficult to control pain likely related to patient being on chronic methadone rx which was stopped during hospital stay.   Pt v anxious. Ativan 1 mg iv.  Dilaudid 1 mg iv.  Pain improved.   Suspected pna on cxr. Wbc elev. t 103.  Discussed pt with on call orthopedist for Dr Diamantina Providence, Dr Ophelia Charter - given pna/fever prompting admission, requests admit to med service, they will consult and follow and will see in AM.  Post cultures, iv abx given.   Hospitalists consulted for admission - discussed with Dr Basil Dess - she will admit.   Final Clinical Impressions(s) / ED Diagnoses   Final diagnoses:  None    New Prescriptions New Prescriptions   No medications on file         Cathren Laine, MD 06/02/17 [phone]

## 2017-06-02 NOTE — Plan of Care (Signed)
Problem: Safety: Goal: Ability to remain free from injury will improve Call bell in reach. Patient educated on use of the call bell and voices understanding.

## 2017-06-02 NOTE — Clinical Social Work Placement (Signed)
   CLINICAL SOCIAL WORK PLACEMENT  NOTE  Date:  06/02/2017  Patient Details  Name: Gloria Patterson MRN: [id number] Date of Birth: [birth year]  Clinical Social Work is seeking post-discharge placement for this patient at the Skilled  Nursing Facility level of care (*CSW will initial, date and re-position this form in  chart as items are completed):  Yes   Patient/family provided with [facility] Clinical Social Work Department's list of facilities offering this level of care within the geographic area requested by the patient (or if unable, by the patient's family).  Yes   Patient/family informed of their freedom to choose among providers that offer the needed level of care, that participate in Medicare, Medicaid or managed care program needed by the patient, have an available bed and are willing to accept the patient.  Yes   Patient/family informed of [facility]'s ownership interest in [facility] and [facility], as well as of the fact that they are under no obligation to receive care at these facilities.  PASRR submitted to EDS on       PASRR number received on       Existing PASRR number confirmed on       FL2 transmitted to all facilities in geographic area requested by pt/family on       FL2 transmitted to all facilities within larger geographic area on 06/01/17     Patient informed that his/her managed care company has contracts with or will negotiate with certain facilities, including the following:        Yes   Patient/family informed of bed offers received.  [facility]     Physician recommends and patient chooses bed at      [facility] on 06/02/17.  Patient to be transferred to facility by [facility]     Patient family notified on   of transfer.  Name of family member notified:        PHYSICIAN       Additional Comment:     _______________________________________________ Gloria LenisElizabeth M [name], LCSW 06/02/2017, 3:10 PM

## 2017-06-02 NOTE — Progress Notes (Signed)
[facility] Anticipated discharge date: 06/02/17 Transportation by: [facility]  Report #: [id number], Room 164  CSW signing off.  Blenda Nicelylizabeth Jud Fanguy LCSW [phone]

## 2017-06-02 NOTE — Progress Notes (Signed)
OT Cancellation Note  Patient Details Name: Gloria Patterson MRN: [id number] DOB: [birth year]   Cancelled Treatment:    Reason Eval/Treat Not Completed: Other (comment) (Pt reports she is about to be moved and doesn't want to be in pain.)  Earlie RavelingLindsey L Francille Wittmann OTR/L 06/02/2017, 12:15 PM

## 2017-06-02 NOTE — Addendum Note (Signed)
Addendum  created 06/02/17 1700 by Kipp BroodJoslin, Jaselynn Tamas, MD   Sign clinical note

## 2017-06-02 NOTE — Progress Notes (Signed)
Anesthesiology Note:  Femoral nerve catheter d/ced. Site OK, tip intact.  Gloria Patterson

## 2017-06-02 NOTE — Progress Notes (Signed)
PT Cancellation Note  Patient Details Name: Gloria HeadsDorothy J [place] MRN: [id number] DOB: Oct 18, 1951   Cancelled Treatment:    Reason Eval/Treat Not Completed: Medical issues which prohibited therapy (Leaving for SNF per nurse and needs A line pulled. Defer as d/c pending.)   Berline LopesDawn F Joliet Mallozzi 06/02/2017, 12:48 PM [facility] T[facility] Kutscher,PT Acute Rehabilitation [phone] (pager)

## 2017-06-02 NOTE — Progress Notes (Signed)
Orthopedic Tech Progress Note Patient Details:  Gloria Patterson 11/03/1951 [id number]  CPM Right Knee CPM Right Knee: On Right Knee Flexion (Degrees): 50 Right Knee Extension (Degrees): 0 Additional Comments:  (not complaining)   Saul FordyceJennifer C Chandria Rookstool 06/02/2017, 2:59 PM

## 2017-06-02 NOTE — ED Triage Notes (Signed)
To room via [facility].  Pt moaning, crying out in pain.  Pt was [facility], upon admission pt was crying out in pain.  [facility] would not accept pt yet d/t only prescription pt has is Methadone and she will not get this until am.

## 2017-06-03 ENCOUNTER — Inpatient Hospital Stay (HOSPITAL_COMMUNITY): Payer: Medicare Other

## 2017-06-03 DIAGNOSIS — R569 Unspecified convulsions: Secondary | ICD-10-CM | POA: Diagnosis present

## 2017-06-03 DIAGNOSIS — Z96653 Presence of artificial knee joint, bilateral: Secondary | ICD-10-CM | POA: Diagnosis present

## 2017-06-03 DIAGNOSIS — J189 Pneumonia, unspecified organism: Secondary | ICD-10-CM

## 2017-06-03 DIAGNOSIS — Z888 Allergy status to other drugs, medicaments and biological substances status: Secondary | ICD-10-CM | POA: Diagnosis not present

## 2017-06-03 DIAGNOSIS — K219 Gastro-esophageal reflux disease without esophagitis: Secondary | ICD-10-CM | POA: Diagnosis present

## 2017-06-03 DIAGNOSIS — J45909 Unspecified asthma, uncomplicated: Secondary | ICD-10-CM | POA: Diagnosis present

## 2017-06-03 DIAGNOSIS — Z96651 Presence of right artificial knee joint: Secondary | ICD-10-CM

## 2017-06-03 DIAGNOSIS — Z7984 Long term (current) use of oral hypoglycemic drugs: Secondary | ICD-10-CM | POA: Diagnosis not present

## 2017-06-03 DIAGNOSIS — Z7901 Long term (current) use of anticoagulants: Secondary | ICD-10-CM | POA: Diagnosis not present

## 2017-06-03 DIAGNOSIS — A419 Sepsis, unspecified organism: Secondary | ICD-10-CM | POA: Diagnosis present

## 2017-06-03 DIAGNOSIS — Z8619 Personal history of other infectious and parasitic diseases: Secondary | ICD-10-CM | POA: Diagnosis not present

## 2017-06-03 DIAGNOSIS — E119 Type 2 diabetes mellitus without complications: Secondary | ICD-10-CM | POA: Diagnosis not present

## 2017-06-03 DIAGNOSIS — I10 Essential (primary) hypertension: Secondary | ICD-10-CM

## 2017-06-03 DIAGNOSIS — Y95 Nosocomial condition: Secondary | ICD-10-CM | POA: Diagnosis present

## 2017-06-03 DIAGNOSIS — F1123 Opioid dependence with withdrawal: Secondary | ICD-10-CM | POA: Diagnosis not present

## 2017-06-03 DIAGNOSIS — M19019 Primary osteoarthritis, unspecified shoulder: Secondary | ICD-10-CM | POA: Diagnosis present

## 2017-06-03 LAB — BASIC METABOLIC PANEL
Anion gap: 7 (ref 5–15)
BUN: 5 mg/dL — AB (ref 6–20)
CALCIUM: 8.4 mg/dL — AB (ref 8.9–10.3)
CO2: 26 mmol/L (ref 22–32)
Chloride: 108 mmol/L (ref 101–111)
Creatinine, Ser: 0.62 mg/dL (ref 0.44–1.00)
GFR calc Af Amer: >60 mL/min (ref >60)
GFR calc non Af Amer: >60 mL/min (ref >60)
GLUCOSE: 106 mg/dL — AB (ref 65–99)
Potassium: 3.7 mmol/L (ref 3.5–5.1)
Sodium: 141 mmol/L (ref 135–145)

## 2017-06-03 LAB — CBC WITH DIFFERENTIAL/PLATELET
BASOS PCT: 0 %
Basophils Absolute: 0 10*3/uL (ref 0.0–0.1)
Eosinophils Absolute: 0.1 10*3/uL (ref 0.0–0.7)
Eosinophils Relative: 0 %
HCT: 32.1 % — ABNORMAL LOW (ref 36.0–46.0)
Hemoglobin: 10.6 g/dL — ABNORMAL LOW (ref 12.0–15.0)
LYMPHS ABS: 4.5 10*3/uL — AB (ref 0.7–4.0)
Lymphocytes Relative: 32 %
MCH: 30.9 pg (ref 26.0–34.0)
MCHC: 33 g/dL (ref 30.0–36.0)
MCV: 93.6 fL (ref 78.0–100.0)
MONOS PCT: 9 %
Monocytes Absolute: 1.2 10*3/uL — ABNORMAL HIGH (ref 0.1–1.0)
Neutro Abs: 8.1 10*3/uL — ABNORMAL HIGH (ref 1.7–7.7)
Neutrophils Relative %: 59 %
Platelets: 177 10*3/uL (ref 150–400)
RBC: 3.43 MIL/uL — ABNORMAL LOW (ref 3.87–5.11)
RDW: 13.7 % (ref 11.5–15.5)
WBC: 13.8 10*3/uL — ABNORMAL HIGH (ref 4.0–10.5)

## 2017-06-03 LAB — GLUCOSE, CAPILLARY
GLUCOSE-CAPILLARY: 84 mg/dL (ref 65–99)
GLUCOSE-CAPILLARY: 111 mg/dL — AB (ref 65–99)
Glucose-Capillary: 127 mg/dL — ABNORMAL HIGH (ref 65–99)
Glucose-Capillary: 90 mg/dL (ref 65–99)

## 2017-06-03 LAB — PROCALCITONIN: PROCALCITONIN: 0.18 ng/mL

## 2017-06-03 LAB — STREP PNEUMONIAE URINARY ANTIGEN: Strep Pneumo Urinary Antigen: NEGATIVE

## 2017-06-03 MED ORDER — TRAMADOL HCL 50 MG PO TABS
50.0000 mg | ORAL_TABLET | Freq: Two times a day (BID) | ORAL | Status: DC | PRN
  Administered 2017-06-03 – 2017-06-05 (×4): 50 mg via ORAL
  Filled 2017-06-03 (×4): qty 1

## 2017-06-03 MED ORDER — HYDRALAZINE HCL 20 MG/ML IJ SOLN
10.0000 mg | Freq: Four times a day (QID) | INTRAMUSCULAR | Status: DC | PRN
  Administered 2017-06-03 – 2017-06-04 (×2): 10 mg via INTRAVENOUS
  Filled 2017-06-03 (×3): qty 1

## 2017-06-03 MED ORDER — POLYETHYLENE GLYCOL 3350 17 G PO PACK
17.0000 g | PACK | Freq: Every day | ORAL | Status: DC | PRN
  Administered 2017-06-03: 17 g via ORAL
  Filled 2017-06-03: qty 1

## 2017-06-03 MED ORDER — MORPHINE SULFATE (PF) 4 MG/ML IV SOLN
2.0000 mg | INTRAVENOUS | Status: DC | PRN
  Administered 2017-06-03 – 2017-06-05 (×17): 2 mg via INTRAVENOUS
  Filled 2017-06-03 (×17): qty 1

## 2017-06-03 MED ORDER — SODIUM CHLORIDE 0.9 % IV SOLN
INTRAVENOUS | Status: DC
  Administered 2017-06-03 – 2017-06-05 (×2): via INTRAVENOUS

## 2017-06-03 MED ORDER — PANTOPRAZOLE SODIUM 40 MG PO TBEC
40.0000 mg | DELAYED_RELEASE_TABLET | Freq: Every day | ORAL | Status: DC
  Administered 2017-06-03 – 2017-06-05 (×3): 40 mg via ORAL
  Filled 2017-06-03 (×4): qty 1

## 2017-06-03 MED ORDER — ONDANSETRON HCL 4 MG/2ML IJ SOLN
4.0000 mg | Freq: Four times a day (QID) | INTRAMUSCULAR | Status: DC | PRN
Start: 2017-06-03 — End: 2017-06-05
  Administered 2017-06-03 – 2017-06-05 (×2): 4 mg via INTRAVENOUS
  Filled 2017-06-03 (×2): qty 2

## 2017-06-03 MED ORDER — BISACODYL 10 MG RE SUPP
10.0000 mg | Freq: Every day | RECTAL | Status: DC | PRN
  Administered 2017-06-03: 10 mg via RECTAL
  Filled 2017-06-03: qty 1

## 2017-06-03 MED ORDER — METHOCARBAMOL 500 MG PO TABS
500.0000 mg | ORAL_TABLET | Freq: Four times a day (QID) | ORAL | Status: DC | PRN
  Administered 2017-06-03 – 2017-06-05 (×7): 500 mg via ORAL
  Filled 2017-06-03 (×7): qty 1

## 2017-06-03 MED ORDER — METHADONE HCL 5 MG/5ML PO SOLN: 88.0000 mg | Freq: Every day | ORAL | Status: DC

## 2017-06-03 MED ORDER — METFORMIN HCL ER 500 MG PO TB24
500.0000 mg | ORAL_TABLET | Freq: Every day | ORAL | Status: DC
  Administered 2017-06-04: 500 mg via ORAL
  Filled 2017-06-03: qty 1

## 2017-06-03 MED ORDER — CLONIDINE HCL 0.1 MG PO TABS
0.1000 mg | ORAL_TABLET | Freq: Two times a day (BID) | ORAL | Status: DC
  Administered 2017-06-03 – 2017-06-05 (×6): 0.1 mg via ORAL
  Filled 2017-06-03 (×6): qty 1

## 2017-06-03 MED ORDER — ACETAMINOPHEN 325 MG PO TABS
650.0000 mg | ORAL_TABLET | Freq: Four times a day (QID) | ORAL | Status: DC | PRN
  Administered 2017-06-03: 650 mg via ORAL
  Filled 2017-06-03: qty 2

## 2017-06-03 MED ORDER — WHITE PETROLATUM GEL
Status: AC
  Administered 2017-06-03: 03:00:00
  Filled 2017-06-03: qty 1

## 2017-06-03 MED ORDER — HYDROMORPHONE HCL 2 MG PO TABS
2.0000 mg | ORAL_TABLET | ORAL | Status: DC | PRN
  Administered 2017-06-03 (×2): 2 mg via ORAL
  Filled 2017-06-03 (×2): qty 1

## 2017-06-03 MED ORDER — RIVAROXABAN 10 MG PO TABS
10.0000 mg | ORAL_TABLET | Freq: Every day | ORAL | Status: DC
  Administered 2017-06-03 – 2017-06-05 (×3): 10 mg via ORAL
  Filled 2017-06-03 (×3): qty 1

## 2017-06-03 MED ORDER — ALBUTEROL SULFATE (2.5 MG/3ML) 0.083% IN NEBU: 2.5000 mg | INHALATION_SOLUTION | RESPIRATORY_TRACT | Status: DC | PRN

## 2017-06-03 MED ORDER — ATORVASTATIN CALCIUM 20 MG PO TABS
20.0000 mg | ORAL_TABLET | Freq: Every day | ORAL | Status: DC
  Administered 2017-06-03 – 2017-06-04 (×2): 20 mg via ORAL
  Filled 2017-06-03 (×2): qty 1

## 2017-06-03 MED ORDER — INSULIN ASPART 100 UNIT/ML ~~LOC~~ SOLN
0.0000 [IU] | Freq: Three times a day (TID) | SUBCUTANEOUS | Status: DC
Start: 2017-06-03 — End: 2017-06-05
  Administered 2017-06-05: 3 [IU] via SUBCUTANEOUS

## 2017-06-03 MED ORDER — DEXTROSE 5 % IV SOLN
1.0000 g | Freq: Three times a day (TID) | INTRAVENOUS | Status: DC
  Administered 2017-06-04 – 2017-06-05 (×4): 1 g via INTRAVENOUS
  Filled 2017-06-03 (×6): qty 1

## 2017-06-03 MED ORDER — IPRATROPIUM-ALBUTEROL 0.5-2.5 (3) MG/3ML IN SOLN: 3.0000 mL | Freq: Four times a day (QID) | RESPIRATORY_TRACT | Status: DC

## 2017-06-03 MED ORDER — HYDROMORPHONE HCL 2 MG PO TABS
4.0000 mg | ORAL_TABLET | ORAL | Status: DC | PRN
  Administered 2017-06-03 – 2017-06-05 (×11): 4 mg via ORAL
  Filled 2017-06-03 (×11): qty 2

## 2017-06-03 MED ORDER — ALBUTEROL SULFATE HFA 108 (90 BASE) MCG/ACT IN AERS: 2.0000 | INHALATION_SPRAY | RESPIRATORY_TRACT | Status: DC | PRN

## 2017-06-03 MED ORDER — PROMETHAZINE HCL 25 MG RE SUPP: 12.5000 mg | Freq: Four times a day (QID) | RECTAL | Status: DC | PRN

## 2017-06-03 MED ORDER — METHADONE HCL 10 MG PO TABS: 85.0000 mg | ORAL_TABLET | Freq: Every day | ORAL | Status: DC

## 2017-06-03 NOTE — Progress Notes (Signed)
   Subjective:      Bounce back from SNF when they would not be able to get her Methadone until 1 AM and she arrived during the day.  Past Heroin and IV cocaine injection Hx who had been on Methadone prior to surgery. Requests pain meds as often as she can get them.  Patient reports pain as moderate.    Objective: Vital signs in last 24 hours: Temp:  [98.6 F (37 C)-103 F (39.4 C)] 99.4 F (37.4 C) (06/17 0644) Pulse Rate:  [81-102] 81 (06/17 0644) Resp:  [16-20] 18 (06/17 0644) BP: (137-175)/(59-96) 166/80 (06/17 0644) SpO2:  [93 %-100 %] 94 % (06/17 0644)  Intake/Output from previous day: 06/16 0701 - 06/17 0700 In: 1250 [IV Piggyback:1250] Out: 750 [Urine:750] Intake/Output this shift: No intake/output data recorded.   Recent Labs  06/02/17 2012 06/03/17 0512  HGB 11.0* 10.6*    Recent Labs  06/02/17 2012 06/03/17 0512  WBC 16.2* 13.8*  RBC 3.57* 3.43*  HCT 33.2* 32.1*  PLT 209 177    Recent Labs  06/02/17 2012 06/03/17 0512  NA 133* 141  K 4.9 3.7  CL 100* 108  CO2 26 26  BUN 7 5*  CREATININE 0.77 0.62  GLUCOSE 120* 106*  CALCIUM 8.5* 8.4*   No results for input(s): LABPT, INR in the last 72 hours.  Neurologically intact  , knee looks normal post op .  Dg Chest Port 1 View  Result Date: 06/02/2017 CLINICAL DATA:  66 y/o  F; fever. EXAM: PORTABLE CHEST 1 VIEW COMPARISON:  05/25/2017 chest radiograph FINDINGS: Stable normal cardiac silhouette. Right cardiophrenic angle opacity may represent an area of consolidation. No pleural effusion or pneumothorax identified. Bones are unremarkable. IMPRESSION: Right cardiophrenic angle opacity may represent an area of consolidation. Consider PA and lateral radiographs for better characterization. Electronically Signed   By: Mitzi HansenLance  Furusawa-Stratton M.D.   On: 06/02/2017 20:49    Assessment/Plan:    Up with therapy  . ABX for pneumonia per med service.   Eldred MangesMark C Brycen Bean 06/03/2017, 10:31 AM

## 2017-06-03 NOTE — Discharge Instructions (Signed)
Information on my medicine - XARELTO® (Rivaroxaban) ° °This medication education was reviewed with me or my healthcare representative as part of my discharge preparation.  The pharmacist that spoke with me during my hospital stay was:  Kasyn Stouffer Dien, RPH ° °Why was Xarelto® prescribed for you? °Xarelto® was prescribed for you to reduce the risk of blood clots forming after orthopedic surgery. The medical term for these abnormal blood clots is venous thromboembolism (VTE). ° °What do you need to know about xarelto® ? °Take your Xarelto® ONCE DAILY at the same time every day. °You may take it either with or without food. ° °If you have difficulty swallowing the tablet whole, you may crush it and mix in applesauce just prior to taking your dose. ° °Take Xarelto® exactly as prescribed by your doctor and DO NOT stop taking Xarelto® without talking to the doctor who prescribed the medication.  Stopping without other VTE prevention medication to take the place of Xarelto® may increase your risk of developing a clot. ° °After discharge, you should have regular check-up appointments with your healthcare provider that is prescribing your Xarelto®.   ° °What do you do if you miss a dose? °If you miss a dose, take it as soon as you remember on the same day then continue your regularly scheduled once daily regimen the next day. Do not take two doses of Xarelto® on the same day.  ° °Important Safety Information °A possible side effect of Xarelto® is bleeding. You should call your healthcare provider right away if you experience any of the following: °? Bleeding from an injury or your nose that does not stop. °? Unusual colored urine (red or dark brown) or unusual colored stools (red or black). °? Unusual bruising for unknown reasons. °? A serious fall or if you hit your head (even if there is no bleeding). ° °Some medicines may interact with Xarelto® and might increase your risk of bleeding while on Xarelto®. To help avoid  this, consult your healthcare provider or pharmacist prior to using any new prescription or non-prescription medications, including herbals, vitamins, non-steroidal anti-inflammatory drugs (NSAIDs) and supplements. ° °This website has more information on Xarelto®: www.xarelto.com. ° ° ° °

## 2017-06-03 NOTE — H&P (Signed)
History and Physical    Gloria Patterson ZOX:[id number] DOB: 1951-10-23 DOA: 06/02/2017  PCP: Ananias Pilgrim, MD Consultants:  Dean/Sherry Rogus - orthopedics Patient [facility] - only arrived today  Chief Complaint: Pain  HPI: Gloria Patterson is a 66 y.o. female with medical history significant of seizures, HTN, h/o polysubstance abuse, hepatitis C, GERD, DM and recent hospitalization for R TKR.  She was hospitalized from 6/14-16 and was discharged earlier today to [facility].  At the time of my evaluation, the patient was obtunded from recent Dilaudid dosing and unable to answer questions.  The triage nurse noted that she was moaning and crying out in pain from knee after having arrived at the SNF and not having been started on her chronic Methadone yet.      ED Course: In review of notes, patient had a fever to 101.5 the day prior to discharge.  Current temp 103.  Given 1 mg Dilaudid for pain and then again with 1 mg Ativan.  Concern for PNA.  Dr. Ophelia Charter (covering for Dr. August Saucer) consulted and requests medicine admission; they will consult in AM.  Review of Systems: Unable to perform   Ambulatory Status:  PT assessment 6/15 showed need for moderate assistance to get patient up to chair and max assistance to get patient back to bed s/p R TKR  Past Medical History:  Diagnosis Date  . Arthritis    knee, shouder  . Asthma    MILD  . Chronic cough   . Constipation   . Diabetes mellitus without complication (HCC)    Type II  . Frequency of urination   . GERD (gastroesophageal reflux disease)   . Hepatitis    Hepatitis C treated   . 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 History of kidney stones    passed  . Hypertension   . Nocturia   . 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  Patient not sure why she was  given Phenobarital,   " before 1978"  . Urgency of urination   . Vaginal vault prolapse    ANTERIOR    Past Surgical History:  Procedure Laterality Date  . ANTERIOR AND POSTERIOR REPAIR N/A 03/10/2013   Procedure: ANTERIOR (CYSTOCELE) AND POSTERIOR REPAIR (RECTOCELE);  Surgeon: Kathi Ludwig, MD;  Location[facility];  Service: Urology;  Laterality: N/A;   [employer] SCI[employer]IFIC UPHOLD LITE ANTERIOR VAULT REPAIR  POSSIBLE OP WITH OBSERVATION POSSIBLE FLOOR BED  Uphold LITE w/Capio SLIM Catalog number [id number] qty 2 Xenform soft tissue repair matrix 6x7 cm [id number]   . BREAST REDUCTION SURGERY    . BREAST SURGERY     B/L removal of benign cysts  . COLONOSCOPY W/ POLYPECTOMY    . CYSTOCELE REPAIR N/A 03/10/2013   Procedure:  Anterior vaginal vault prolapse repair, with [employer] uphold light sacrospinous fixation using U. device and mesh repair with [name] plication   ;  Surgeon: Kathi Ludwig, MD;  Location[facility];  Service: Urology;  Laterality: N/A;  . ESOPHAGOGASTRODUODENOSCOPY    . TOTAL KNEE ARTHROPLASTY Left 04/08/2013   Procedure: TOTAL KNEE ARTHROPLASTY;  Surgeon: Cammy Copa, MD;  Location: [facility] OR;  Service: Orthopedics;  Laterality: Left;  Left total knee  arthroplasty  . TOTAL KNEE ARTHROPLASTY Right 05/31/2017   Procedure: RIGHT TOTAL KNEE ARTHROPLASTY;  Surgeon: Cammy Copa, MD;  Location[facility] OR;  Service: Orthopedics;  Laterality: Right;  . TUBAL LIGATION      Social History   Social History  . Marital status: Single    Spouse name: N/A  . Number of children: N/A  . Years of education: N/A   Occupational History  . Not on file.   Social History Main Topics  . Smoking status: Never Smoker  . Smokeless tobacco: Never Used     Comment: CANNOT HAVE NARCOTIC MEDICATIONS PER PT  . Alcohol use No  . Drug use: No     Comment: Stopped using  Cocoine and Heroin 2011  . Sexual activity: No    Other Topics Concern  . Not on file   Social History Narrative  . No narrative on file    Allergies  Allergen Reactions  . Phenobarbital Other (See Comments)     ? SEIZURES ?  [ QUESTION IF SZs RESULT FROM PHENOBARB ? ]     Family History  Problem Relation Age of Onset  . Prostate cancer Father   . Breast cancer Sister     Prior to Admission medications   Medication Sig Start Date End Date Taking? Authorizing Provider  albuterol (PROVENTIL HFA) 108 (90 BASE) MCG/ACT inhaler Inhale 2 puffs into the lungs every 4 (four) hours as needed for wheezing or shortness of breath.    Yes [provider]  atorvastatin (LIPITOR) 20 MG tablet Take 20 mg by mouth at bedtime.   Yes [provider]  cloNIDine (CATAPRES) 0.1 MG tablet Take 1 tablet (0.1 mg total) by mouth 2 (two) times daily. 08/25/13  Yes Nyoka Cowden, MD  metFORMIN (GLUCOPHAGE) 500 MG tablet TAKE 1 TABLET BY MOUTH EVERY DAY WITH FOOD - OFFICE VISIT BEFORE NEXT REFILL 04/16/17  Yes [provider]  METHADONE HCL PO Take 88 mg by mouth daily. Liquid unit doses from [facility] [phone].   Yes [provider]  Multiple Vitamin (MULTIVITAMIN WITH MINERALS) TABS tablet Take 1 tablet by mouth daily.   Yes [provider]  Nutritional Supplements (ESTROVEN PO) Take 1 tablet by mouth daily.   Yes [provider]  omeprazole (PRILOSEC) 40 MG capsule Take 1 capsule (40 mg total) by mouth daily. 11/25/13  Yes Nyoka Cowden, MD  polyethylene glycol (MIRALAX / GLYCOLAX) packet Take 17 g by mouth daily as needed for mild constipation.    Yes [provider]  traMADol (ULTRAM) 50 MG tablet Take 1 tablet (50 mg total) by mouth 2 (two) times daily. Patient taking differently: Take 50 mg by mouth 2 (two) times daily as needed (pain).  05/16/17  Yes Cammy Copa, MD  triamcinolone cream (KENALOG) 0.1 % Apply 1 application topically daily as needed for itching or rash. 02/21/17  Yes  [provider]  gabapentin (NEURONTIN) 100 MG capsule TAKE 1 CAPSULE (100 MG TOTAL) BY MOUTH 3 (THREE) TIMES DAILY. Patient not taking: Reported on 05/24/2017 06/04/14   Nyoka Cowden, MD  methocarbamol (ROBAXIN) 500 MG tablet Take 1 tablet (500 mg total) by mouth every 6 (six) hours as needed for muscle spasms. 06/01/17   Cammy Copa, MD  rivaroxaban Carlena Hurl) 10 MG TABS tablet Take 1 tablet (10 mg total) by mouth daily with breakfast. 06/01/17   Cammy Copa, MD    Physical Exam: Vitals:   06/02/17 2115  06/02/17 2130 06/02/17 2345 06/03/17 0015  BP: (!) 146/60   (!) 165/76  Pulse: (!) 102  94 91  Resp: 16     Temp:  99.1 F (37.3 C)  100.3 F (37.9 C)  TempSrc:  Oral  Oral  SpO2: 96%  93%      General: Obtunded, minimally opens eyes to voice/pain and lapses back to sleep Eyes:  PERRL, EOMI, normal lids, iris ENT:  grossly normal hearing, lips & tongue, mmm Neck:  no LAD, masses or thyromegaly Cardiovascular:  Mild tachycardia, no m/r/g. No LE edema.  Respiratory:  CTA bilaterally, no w/r/r. Normal respiratory effort. Abdomen:  soft, ntnd, NABS Skin:  no rash or induration seen on limited exam Musculoskeletal:  R knee immobilizer in place.  Per Dr. Denton Lank: Right knee with original post surgery dressing intact. About knee there is mild warmth, and slight erythema to skin, felt most c/w post operative change - no purulence noted or definite cellulitis to skin. w mild/passive rom knee, no exquisite pain.   Psychiatric:  Obtunded from pain medication Neurologic:  Unable to assess  Labs on Admission: I have personally reviewed following labs and imaging studies  CBC:  Recent Labs Lab 06/02/17 2012  WBC 16.2*  NEUTROABS 10.4*  HGB 11.0*  HCT 33.2*  MCV 93.0  PLT 209   Basic Metabolic Panel:  Recent Labs Lab 06/02/17 2012  NA 133*  K 4.9  CL 100*  CO2 26  GLUCOSE 120*  BUN 7  CREATININE 0.77  CALCIUM 8.5*   GFR: Estimated Creatinine  Clearance: 64.7 mL/min (by C-G formula based on SCr of 0.77 mg/dL). Liver Function Tests: No results for input(s): AST, ALT, ALKPHOS, BILITOT, PROT, ALBUMIN in the last 168 hours. No results for input(s): LIPASE, AMYLASE in the last 168 hours. No results for input(s): AMMONIA in the last 168 hours. Coagulation Profile: No results for input(s): INR, PROTIME in the last 168 hours. Cardiac Enzymes: No results for input(s): CKTOTAL, CKMB, CKMBINDEX, TROPONINI in the last 168 hours. BNP (last 3 results) No results for input(s): PROBNP in the last 8760 hours. HbA1C: No results for input(s): HGBA1C in the last 72 hours. CBG:  Recent Labs Lab 06/01/17 1209 06/01/17 1613 06/01/17 2110 06/02/17 0634 06/02/17 1249  GLUCAP 137* 152* 169* 88 134*   Lipid Profile: No results for input(s): CHOL, HDL, LDLCALC, TRIG, CHOLHDL, LDLDIRECT in the last 72 hours. Thyroid Function Tests: No results for input(s): TSH, T4TOTAL, FREET4, T3FREE, THYROIDAB in the last 72 hours. Anemia Panel: No results for input(s): VITAMINB12, FOLATE, FERRITIN, TIBC, IRON, RETICCTPCT in the last 72 hours. Urine analysis:    Component Value Date/Time   COLORURINE YELLOW 06/02/2017 2125   APPEARANCEUR CLEAR 06/02/2017 2125   LABSPEC 1.012 06/02/2017 2125   PHURINE 7.0 06/02/2017 2125   GLUCOSEU NEGATIVE 06/02/2017 2125   HGBUR NEGATIVE 06/02/2017 2125   BILIRUBINUR NEGATIVE 06/02/2017 2125   KETONESUR NEGATIVE 06/02/2017 2125   PROTEINUR NEGATIVE 06/02/2017 2125   UROBILINOGEN 1.0 04/08/2013 1125   NITRITE NEGATIVE 06/02/2017 2125   LEUKOCYTESUR NEGATIVE 06/02/2017 2125    Creatinine Clearance: Estimated Creatinine Clearance: 64.7 mL/min (by C-G formula based on SCr of 0.77 mg/dL).  Sepsis Labs: @LABRCNTIP (procalcitonin:4,lacticidven:4) ) Recent Results (from the past 240 hour(s))  Urine culture     Status: Abnormal   Collection Time: 05/25/17  3:02 PM  Result Value Ref Range Status   Specimen Description  URINE, CLEAN CATCH  Final   Special Requests NONE  Final  Culture <10,000 COLONIES/mL INSIGNIFICANT GROWTH (A)  Final   Report Status 05/26/2017 FINAL  Final  Surgical pcr screen     Status: None   Collection Time: 05/25/17  3:02 PM  Result Value Ref Range Status   MRSA, PCR NEGATIVE NEGATIVE Final   Staphylococcus aureus NEGATIVE NEGATIVE Final    Comment:        The Xpert SA Assay (FDA approved for NASAL specimens in patients over 68 years of age), is one component of a comprehensive surveillance program.  Test performance has been validated by [facility] for patients greater than or equal to 68 year old. It is not intended to diagnose infection nor to guide or monitor treatment.      Radiological Exams on Admission: Dg Chest Port 1 View  Result Date: 06/02/2017 CLINICAL DATA:  66 y/o  F; fever. EXAM: PORTABLE CHEST 1 VIEW COMPARISON:  05/25/2017 chest radiograph FINDINGS: Stable normal cardiac silhouette. Right cardiophrenic angle opacity may represent an area of consolidation. No pleural effusion or pneumothorax identified. Bones are unremarkable. IMPRESSION: Right cardiophrenic angle opacity may represent an area of consolidation. Consider PA and lateral radiographs for better characterization. Electronically Signed   By: Mitzi HansenLance  Furusawa-Stratton M.D.   On: 06/02/2017 20:49    EKG: not done  Assessment/Plan Principal Problem:   HCAP (healthcare-associated pneumonia) Active Problems:   Narcotic withdrawal (HCC)   Essential hypertension   Sepsis (HCC)   S/P TKR (total knee replacement), right   Type 2 diabetes mellitus without complication (HCC)   Sepsis presumably from HCAP -Elevated WBC count (16.2), fever (103), tachycardia with normal lactate  -While awaiting blood cultures, this appears to be a preseptic condition. -Sepsis protocol initiated -Given fever to 103, mildly decreased oxygen saturation, and infiltrate in right costophrenic angle on chest x-ray , most  likely pneumonia.  -Sepsis physiology resulting from acute narcotic withdrawal is also a consideration. -CURB-65 score is 1 - will admit the patient to Med Surg. -The patient will need treatment for HCAP due to recent hospitalization; will treat with Cefepime monotherapy (no Vancomycin since she was MRSA during that hospitalization). -NS @ 75cc/hr -Fever control -Repeat CBC in am -Sputum cultures -Blood cultures -Strep pneumo testing -Will order non-ICU procalcitonin algorithm.  >0.5 indicates infection and >>0.5 indicates more serious disease.  As the procalcitonin level normalizes, it will be reasonable to consider de-escalation of antibiotic coverage. -albuterol PRN -Standing Duonebs -Blood cultures pending -UA WNL -Lactate 1.43  Narcotic withdrawal -Patient with past h/o polysubstance abuse -She was treated with narcotics while inpatient but was given a hand-written Rx for methadone at the time of discharge -The patient was c/o severe pain and apparently difficult to control her pain in the ER -Suspect narcotic withdrawal -She is s/p Dilaudid and Ativan and obtunded -Will resume PO Methadone, Tramadol, and give 2 mg morphine for severe breakthrough pain  S/p TKR -Exam by Dr. Denton LankSteinl indicates that this is not likely the source of infection -Orthopedics consult requested; they will see patient in the AM -Start Xarelto  DM -Glucose 120 -Hold Glucophage -Cover with SSI  HTN -Continue Clonidine, although this is an unusual choice for monotherapy for HTN   DVT prophylaxis: Xarelto Code Status:  Full - based on prior hospitalization Family Communication: None present Disposition Plan:  To SNF once clinically improved Consults called: Orthopedics - to see in AM Admission status: Admit - It is my clinical opinion that admission to INPATIENT is reasonable and necessary because this patient will require at  least 2 midnights in the hospital to treat this condition based on the  medical complexity of the problems presented.  Given the aforementioned information, the predictability of an adverse outcome is felt to be significant.    Jonah Blue MD [facility]  If 7PM-7AM, please contact night-coverage [email or web address] Password [email or web address]  06/03/2017, 1:00 AM

## 2017-06-03 NOTE — Progress Notes (Signed)
Patient ID: Gloria Patterson, female   DOB: Nov 30, 1951, 66 y.o.   MRN: [id number]                                                                PROGRESS NOTE                                                                                                                                                                                                             Patient Demographics:    Gloria Patterson, is a 66 y.o. female, DOB - 11-25-1951, [id number]  Admit date - 06/02/2017   Admitting Physician Jonah Blue, MD  Outpatient Primary MD for the patient is Ananias Pilgrim, MD  LOS - 0  Outpatient Specialists:   Dean/Yates (orthopedics) , Kipp Brood (Anesthesia)  Chief Complaint  Patient presents with  . pain control       Brief Narrative     66 y.o. female with medical history significant of seizures, HTN, h/o polysubstance abuse, hepatitis C, GERD, DM and recent hospitalization for R TKR.  She was hospitalized from 6/14-16 and was discharged earlier today to [facility].  At the time of my evaluation, the patient was obtunded from recent Dilaudid dosing and unable to answer questions.  The triage nurse noted that she was moaning and crying out in pain from knee after having arrived at the SNF and not having been started on her chronic Methadone yet.      ED Course: In review of notes, patient had a fever to 101.5 the day prior to discharge.  Current temp 103.  Given 1 mg Dilaudid for pain and then again with 1 mg Ativan.  Concern for PNA.  Dr. Ophelia Charter (covering for Dr. August Saucer) consulted and requests medicine admission; they will consult in AM.    Subjective:    Gloria Patterson today is alert and oriented x3.  Pt denies fever, chills, cough, cp, palp, sob.  Pt states that she presented due to not being given pain medication at SNF.   No headache  No abdominal pain - No Nausea, No new weakness tingling or numbness,    Assessment  & Plan :    Principal Problem:  HCAP (healthcare-associated pneumonia) Active Problems:   Narcotic withdrawal (HCC)   Essential hypertension   Sepsis (  HCC)   S/P TKR (total knee replacement), right   Type 2 diabetes mellitus without complication (HCC)   Sepsis presumably from HCAP Blood culture pending Sputum culture pending vanco iv, cefepime iv  Narcotic withdrawal -Patient with past h/o polysubstance abuse -She was treated with narcotics while inpatient but was given a hand-written Rx for methadone at the time of discharge -The patient was c/o severe pain and apparently difficult to control her pain in the ER -Suspect narcotic withdrawal -She is s/p Dilaudid and Ativan and obtunded -Will resume PO Methadone, Tramadol, and give 2 mg morphine for severe breakthrough pain  S/p TKR -Exam by Dr. Denton Lank indicates that this is not likely the source of infection -Orthopedics consult requested; they will see patient in the AM -Start Xarelto  DM -Glucose 120 -Hold Glucophage -Cover with SSI  HTN -Continue Clonidine, although this is an unusual choice for monotherapy for HTN   DVT prophylaxis: Xarelto Code Status:  Full - based on prior hospitalization Family Communication: None present Disposition Plan:  To SNF once clinically improved Consults called: Orthopedics - to see in AM Admission status: Admit - It is my clinical opinion that admission to INPATIENT is reasonable and necessary because this patient will require at least 2 midnights in the hospital to treat this condition based on the medical complexity of the problems presented.  Given the aforementioned information, the predictability of an adverse outcome is felt to be significant.     Lab Results  Component Value Date   PLT 177 06/03/2017    Antibiotics  :  Vanco/ cefepime 6/16=>  Anti-infectives    Start     Dose/Rate Route Frequency Ordered Stop   06/04/17 0600  ceFEPIme (MAXIPIME) 1 g in dextrose 5 % 50 mL IVPB     1 g 100 mL/hr  over 30 Minutes Intravenous Every 8 hours 06/03/17 0230 06/12/17 0559   06/02/17 2300  ceFEPIme (MAXIPIME) 2 g in dextrose 5 % 50 mL IVPB     2 g 100 mL/hr over 30 Minutes Intravenous  Once 06/02/17 2256 06/03/17 0042   06/02/17 2300  vancomycin (VANCOCIN) IVPB 1000 mg/200 mL premix     1,000 mg 200 mL/hr over 60 Minutes Intravenous  Once 06/02/17 2256 06/03/17 0112        Objective:   Vitals:   06/03/17 0015 06/03/17 0115 06/03/17 0241 06/03/17 0644  BP: (!) 165/76 (!) 137/59 (!) 174/80 (!) 166/80  Pulse: 91 86 86 81  Resp:    18  Temp: 100.3 F (37.9 C)  98.6 F (37 C) 99.4 F (37.4 C)  TempSrc: Oral  Oral Oral  SpO2:  94% 100% 94%    Wt Readings from Last 3 Encounters:  06/01/17 75 kg (165 lb 5.5 oz)  05/25/17 75.4 kg (166 lb 4.8 oz)  11/25/13 86.6 kg (191 lb)     Intake/Output Summary (Last 24 hours) at 06/03/17 0727 Last data filed at 06/03/17 0112  Gross per 24 hour  Intake             1250 ml  Output              750 ml  Net              500 ml     Physical Exam  Awake Alert, Oriented X 3, No new F.N deficits, Normal affect [place].AT,PERRAL Supple Neck,No JVD, No cervical lymphadenopathy appriciated.  Symmetrical Chest wall movement, Good air movement bilaterally, CTAB RRR,No Gallops,Rubs  or new Murmurs, No Parasternal Heave +ve B.Sounds, Abd Soft, No tenderness, No organomegaly appriciated, No rebound - guarding or rigidity. No Cyanosis, Clubbing or edema, No new Rash or bruise       Data Review:    CBC  Recent Labs Lab 06/02/17 2012 06/03/17 0512  WBC 16.2* 13.8*  HGB 11.0* 10.6*  HCT 33.2* 32.1*  PLT 209 177  MCV 93.0 93.6  MCH 30.8 30.9  MCHC 33.1 33.0  RDW 13.7 13.7  LYMPHSABS 4.2* 4.5*  MONOABS 1.6* 1.2*  EOSABS 0.0 0.1  BASOSABS 0.0 0.0    Chemistries   Recent Labs Lab 06/02/17 2012 06/03/17 0512  NA 133* 141  K 4.9 3.7  CL 100* 108  CO2 26 26  GLUCOSE 120* 106*  BUN 7 5*  CREATININE 0.77 0.62  CALCIUM 8.5* 8.4*    ------------------------------------------------------------------------------------------------------------------ No results for input(s): CHOL, HDL, LDLCALC, TRIG, CHOLHDL, LDLDIRECT in the last 72 hours.  Lab Results  Component Value Date   HGBA1C 6.4 (H) 05/25/2017   ------------------------------------------------------------------------------------------------------------------ No results for input(s): TSH, T4TOTAL, T3FREE, THYROIDAB in the last 72 hours.  Invalid input(s): FREET3 ------------------------------------------------------------------------------------------------------------------ No results for input(s): VITAMINB12, FOLATE, FERRITIN, TIBC, IRON, RETICCTPCT in the last 72 hours.  Coagulation profile No results for input(s): INR, PROTIME in the last 168 hours.  No results for input(s): DDIMER in the last 72 hours.  Cardiac Enzymes No results for input(s): CKMB, TROPONINI, MYOGLOBIN in the last 168 hours.  Invalid input(s): CK ------------------------------------------------------------------------------------------------------------------ No results found for: BNP  Inpatient Medications  Scheduled Meds: . atorvastatin  20 mg Oral QHS  . cloNIDine  0.1 mg Oral BID  . insulin aspart  0-15 Units Subcutaneous TID WC  . ipratropium-albuterol  3 mL Nebulization Q6H  . methadone  85 mg Oral Daily  . pantoprazole  40 mg Oral Daily  . rivaroxaban  10 mg Oral Q breakfast   Continuous Infusions: . sodium chloride 75 mL/hr at 06/03/17 0253  . [START ON 06/04/2017] ceFEPime (MAXIPIME) IV     PRN Meds:.albuterol, methocarbamol, morphine injection, polyethylene glycol, traMADol  Micro Results Recent Results (from the past 240 hour(s))  Urine culture     Status: Abnormal   Collection Time: 05/25/17  3:02 PM  Result Value Ref Range Status   Specimen Description URINE, CLEAN CATCH  Final   Special Requests NONE  Final   Culture <10,000 COLONIES/mL INSIGNIFICANT  GROWTH (A)  Final   Report Status 05/26/2017 FINAL  Final  Surgical pcr screen     Status: None   Collection Time: 05/25/17  3:02 PM  Result Value Ref Range Status   MRSA, PCR NEGATIVE NEGATIVE Final   Staphylococcus aureus NEGATIVE NEGATIVE Final    Comment:        The Xpert SA Assay (FDA approved for NASAL specimens in patients over 68 years of age), is one component of a comprehensive surveillance program.  Test performance has been validated by [facility] for patients greater than or equal to 68 year old. It is not intended to diagnose infection nor to guide or monitor treatment.     Radiology Reports Dg Chest 2 View  Result Date: 05/25/2017 CLINICAL DATA:  Pre right knee total arthroplasty evaluation. EXAM: CHEST  2 VIEW COMPARISON:  09/03/2013. FINDINGS: Normal sized heart. Stable linear scarring in the left lower lung zone. Clear right lung. Central peribronchial thickening without significant change. Minimal scoliosis and minimal thoracic spine degenerative changes. IMPRESSION: No acute abnormality. Stable mild to moderate  chronic bronchitic changes. Electronically Signed   By: Beckie Salts M.D.   On: 05/25/2017 14:59   Dg Chest Port 1 View  Result Date: 06/02/2017 CLINICAL DATA:  66 y/o  F; fever. EXAM: PORTABLE CHEST 1 VIEW COMPARISON:  05/25/2017 chest radiograph FINDINGS: Stable normal cardiac silhouette. Right cardiophrenic angle opacity may represent an area of consolidation. No pleural effusion or pneumothorax identified. Bones are unremarkable. IMPRESSION: Right cardiophrenic angle opacity may represent an area of consolidation. Consider PA and lateral radiographs for better characterization. Electronically Signed   By: Mitzi Hansen M.D.   On: 06/02/2017 20:49   Xr Knee 3 View Right  Result Date: 05/04/2017 AP lateral right knee reviewed.  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  Slight varus alignment is present.   Total knee replacement and plate on the femur in good position on the left knee.  Posterior spurring present.   Time Spent in minutes  30   Pearson Grippe M.D on 06/03/2017 at 7:27 AM  Between 7am to 7pm - Pager - [phone]  After 7pm go to [email or web address] - password [facility]  [facility] -  Office  [phone]

## 2017-06-04 DIAGNOSIS — F1123 Opioid dependence with withdrawal: Secondary | ICD-10-CM

## 2017-06-04 DIAGNOSIS — E119 Type 2 diabetes mellitus without complications: Secondary | ICD-10-CM

## 2017-06-04 LAB — GLUCOSE, CAPILLARY
GLUCOSE-CAPILLARY: 65 mg/dL (ref 65–99)
GLUCOSE-CAPILLARY: 85 mg/dL (ref 65–99)
GLUCOSE-CAPILLARY: 81 mg/dL (ref 65–99)
Glucose-Capillary: 121 mg/dL — ABNORMAL HIGH (ref 65–99)
Glucose-Capillary: 100 mg/dL — ABNORMAL HIGH (ref 65–99)

## 2017-06-04 LAB — HEMOGLOBIN A1C
Hgb A1c MFr Bld: 6.3 % — ABNORMAL HIGH (ref 4.8–5.6)
Mean Plasma Glucose: 134 mg/dL

## 2017-06-04 MED ORDER — DEXTROSE 5 % IV SOLN: 1.0000 g | Freq: Three times a day (TID) | INTRAVENOUS | 0 refills | Status: DC

## 2017-06-04 MED ORDER — ONDANSETRON HCL 4 MG/2ML IJ SOLN: 4.0000 mg | Freq: Four times a day (QID) | INTRAMUSCULAR | 0 refills | Status: DC | PRN

## 2017-06-04 MED ORDER — HYDROMORPHONE HCL 4 MG PO TABS: 4.0000 mg | ORAL_TABLET | ORAL | 0 refills | Status: DC | PRN

## 2017-06-04 MED ORDER — HYDRALAZINE HCL 20 MG/ML IJ SOLN
10.0000 mg | INTRAMUSCULAR | Status: DC | PRN
Start: 2017-06-04 — End: 2017-06-05
  Administered 2017-06-05 (×2): 10 mg via INTRAVENOUS
  Filled 2017-06-04 (×2): qty 1

## 2017-06-04 MED ORDER — HYDRALAZINE HCL 20 MG/ML IJ SOLN
5.0000 mg | INTRAMUSCULAR | Status: AC
  Administered 2017-06-04: 5 mg via INTRAVENOUS
  Filled 2017-06-04: qty 1

## 2017-06-04 MED ORDER — BISACODYL 10 MG RE SUPP: 10.0000 mg | Freq: Every day | RECTAL | 0 refills | Status: DC | PRN

## 2017-06-04 NOTE — Progress Notes (Signed)
Paged doctor about pt's BP being elevated.

## 2017-06-04 NOTE — Progress Notes (Signed)
Patient ID: Gloria Patterson, female   DOB: 08/03/1951, 66 y.o.   MRN: [id number]                                                                PROGRESS NOTE                                                                                                                                                                                                             Patient Demographics:    Gloria Patterson, is a 66 y.o. female, DOB - 06-Aug-1951, [id number]:[id number]  Admit date - 06/02/2017   Admitting Physician Jonah Blue, MD  Outpatient Primary MD for the patient is Ananias Pilgrim, MD  LOS - 1  Outpatient Specialists:  Dean/Yates, Kipp Brood  Chief Complaint  Patient presents with  . pain control       Brief Narrative    66 y.o.femalewith medical history significant of seizures, HTN, h/o polysubstance abuse, hepatitis C, GERD, DM and recent hospitalization for R TKR. She was hospitalized from 6/14-16 and was discharged earlier today to [facility]. At the time of my evaluation, the patient was obtunded from recent Dilaudid dosing and unable to answer questions. The triage nurse noted that she was moaning and crying out in pain from knee after having arrived at the SNF and not having been started on her chronic Methadone yet.     ED Course:In review of notes, patient had a fever to 101.5 the day prior to discharge. Current temp 103. Given 1 mg Dilaudid for pain and then again with 1 mg Ativan. Concern for PNA. Dr. Ophelia Charter (covering for Dr. August Saucer) consulted and requests medicine admission; they will consult in AM.   Subjective:    Gloria Patterson today states pain controlled on dilaudid.  Leukocytosis improving,  Denies fever, chills, cough, sob. Slight constipation.    No headache, No chest pain, No abdominal pain - No Nausea, No new weakness tingling or numbness   Assessment  & Plan :    Principal Problem:   HCAP (healthcare-associated pneumonia) Active  Problems:   Narcotic withdrawal (HCC)   Essential hypertension   Sepsis (HCC)   S/P TKR (total knee replacement), right   Type 2 diabetes mellitus without complication (HCC)    Sepsis presumably from HCAP Blood  culture ngtd Sputum culture  vanco iv, cefepime iv D#2  Narcotic withdrawal -Patient with past h/o polysubstance abuse -She was treated with narcotics while inpatient but was given a hand-written Rx for methadone at the time of discharge -The patient was c/o severe pain and apparently difficult to control her pain in the ER -Suspect narcotic withdrawal Doing well on dilaudid 4mg  po q4h, may be better to discharge to SNF on this rather than methadone which is more difficult to procure.   S/p TKR -Exam by Dr. Denton Lank indicates that this is not likely the source of infection -Orthopedics consult requested; they will see patient in the AM -Start Xarelto  DM -Glucose 120 Restarted Metformin -Cover with SSI  HTN -Continue Clonidine, although this is an unusual choice for monotherapy for HTN   DVT prophylaxis:Xarelto Code Status:Full - based on prior hospitalization Family Communication:None present Disposition Plan:To SNF Consults called:Orthopedics - to see in AM Admission status:Admit - It is my clinical opinion that admission to INPATIENTis reasonable and necessary because this patient will require at least 2 midnights in the hospital to treat this condition based on the medical complexity of the problems presented. Given the aforementioned information, the predictability of an adverse outcome is felt to be significant.     Lab Results  Component Value Date   PLT 177 06/03/2017     Anti-infectives    Start     Dose/Rate Route Frequency Ordered Stop   06/04/17 0600  ceFEPIme (MAXIPIME) 1 g in dextrose 5 % 50 mL IVPB     1 g 100 mL/hr over 30 Minutes Intravenous Every 8 hours 06/03/17 0230 06/12/17 0559   06/04/17 0000  ceFEPIme 1 g in  dextrose 5 % 50 mL     1 g 100 mL/hr over 30 Minutes Intravenous Every 8 hours 06/04/17 0741 06/10/17 2359   06/02/17 2300  ceFEPIme (MAXIPIME) 2 g in dextrose 5 % 50 mL IVPB     2 g 100 mL/hr over 30 Minutes Intravenous  Once 06/02/17 2256 06/03/17 0042   06/02/17 2300  vancomycin (VANCOCIN) IVPB 1000 mg/200 mL premix     1,000 mg 200 mL/hr over 60 Minutes Intravenous  Once 06/02/17 2256 06/03/17 0112        Objective:   Vitals:   06/03/17 1300 06/03/17 1800 06/03/17 2017 06/03/17 2130  BP: (!) 170/90 (!) 169/76 (!) 181/80 (!) 142/68  Pulse: 98  97   Resp: 18  20   Temp: 99.5 F (37.5 C)  (!) 100.6 F (38.1 C) 99.6 F (37.6 C)  TempSrc: Oral  Oral Oral  SpO2: 100%  100%     Wt Readings from Last 3 Encounters:  06/01/17 75 kg (165 lb 5.5 oz)  05/25/17 75.4 kg (166 lb 4.8 oz)  11/25/13 86.6 kg (191 lb)     Intake/Output Summary (Last 24 hours) at 06/04/17 0746 Last data filed at 06/03/17 1700  Gross per 24 hour  Intake              720 ml  Output              800 ml  Net              -80 ml     Physical Exam  Awake Alert, Oriented X 3, No new F.N deficits, Normal affect [place].AT,PERRAL Supple Neck,No JVD, No cervical lymphadenopathy appriciated.  Symmetrical Chest wall movement, Good air movement bilaterally, CTAB RRR,No Gallops,Rubs or new Murmurs, No Parasternal  Heave +ve B.Sounds, Abd Soft, No tenderness, No organomegaly appriciated, No rebound - guarding or rigidity. No Cyanosis, Clubbing or edema, No new Rash or bruise      Data Review:    CBC  Recent Labs Lab 06/02/17 2012 06/03/17 0512  WBC 16.2* 13.8*  HGB 11.0* 10.6*  HCT 33.2* 32.1*  PLT 209 177  MCV 93.0 93.6  MCH 30.8 30.9  MCHC 33.1 33.0  RDW 13.7 13.7  LYMPHSABS 4.2* 4.5*  MONOABS 1.6* 1.2*  EOSABS 0.0 0.1  BASOSABS 0.0 0.0    Chemistries   Recent Labs Lab 06/02/17 2012 06/03/17 0512  NA 133* 141  K 4.9 3.7  CL 100* 108  CO2 26 26  GLUCOSE 120* 106*  BUN 7 5*    CREATININE 0.77 0.62  CALCIUM 8.5* 8.4*   ------------------------------------------------------------------------------------------------------------------ No results for input(s): CHOL, HDL, LDLCALC, TRIG, CHOLHDL, LDLDIRECT in the last 72 hours.  Lab Results  Component Value Date   HGBA1C 6.4 (H) 05/25/2017   ------------------------------------------------------------------------------------------------------------------ No results for input(s): TSH, T4TOTAL, T3FREE, THYROIDAB in the last 72 hours.  Invalid input(s): FREET3 ------------------------------------------------------------------------------------------------------------------ No results for input(s): VITAMINB12, FOLATE, FERRITIN, TIBC, IRON, RETICCTPCT in the last 72 hours.  Coagulation profile No results for input(s): INR, PROTIME in the last 168 hours.  No results for input(s): DDIMER in the last 72 hours.  Cardiac Enzymes No results for input(s): CKMB, TROPONINI, MYOGLOBIN in the last 168 hours.  Invalid input(s): CK ------------------------------------------------------------------------------------------------------------------ No results found for: BNP  Inpatient Medications  Scheduled Meds: . atorvastatin  20 mg Oral QHS  . cloNIDine  0.1 mg Oral BID  . insulin aspart  0-15 Units Subcutaneous TID WC  . metFORMIN  500 mg Oral Q breakfast  . pantoprazole  40 mg Oral Daily  . rivaroxaban  10 mg Oral Q breakfast   Continuous Infusions: . sodium chloride 75 mL/hr at 06/03/17 0253  . ceFEPime (MAXIPIME) IV Stopped (06/04/17 0703)   PRN Meds:.acetaminophen, albuterol, bisacodyl, hydrALAZINE, HYDROmorphone, methocarbamol, morphine injection, ondansetron (ZOFRAN) IV, polyethylene glycol, traMADol  Micro Results Recent Results (from the past 240 hour(s))  Urine culture     Status: Abnormal   Collection Time: 05/25/17  3:02 PM  Result Value Ref Range Status   Specimen Description URINE, CLEAN CATCH   Final   Special Requests NONE  Final   Culture <10,000 COLONIES/mL INSIGNIFICANT GROWTH (A)  Final   Report Status 05/26/2017 FINAL  Final  Surgical pcr screen     Status: None   Collection Time: 05/25/17  3:02 PM  Result Value Ref Range Status   MRSA, PCR NEGATIVE NEGATIVE Final   Staphylococcus aureus NEGATIVE NEGATIVE Final    Comment:        The Xpert SA Assay (FDA approved for NASAL specimens in patients over 68 years of age), is one component of a comprehensive surveillance program.  Test performance has been validated by [facility] for patients greater than or equal to 68 year old. It is not intended to diagnose infection nor to guide or monitor treatment.   Blood culture (routine x 2)     Status: None (Preliminary result)   Collection Time: 06/02/17  8:34 PM  Result Value Ref Range Status   Specimen Description BLOOD RIGHT ANTECUBITAL  Final   Special Requests   Final    BOTTLES DRAWN AEROBIC ONLY Blood Culture adequate volume   Culture NO GROWTH < 12 HOURS  Final   Report Status PENDING  Incomplete  Blood culture (  routine x 2)     Status: None (Preliminary result)   Collection Time: 06/02/17  8:49 PM  Result Value Ref Range Status   Specimen Description BLOOD RIGHT HAND  Final   Special Requests   Final    BOTTLES DRAWN AEROBIC AND ANAEROBIC Blood Culture adequate volume   Culture NO GROWTH < 12 HOURS  Final   Report Status PENDING  Incomplete    Radiology Reports Dg Chest 2 View  Result Date: 06/03/2017 CLINICAL DATA:  Pneumonia. EXAM: CHEST  2 VIEW COMPARISON:  06/02/2017 FINDINGS: Heart size is normal. There is lower lobe density best seen on the lateral view and favored to be within the medial aspect of the right lower lobe. There is mild perihilar peribronchial thickening. IMPRESSION: Suspect right lower lobe infiltrate. Electronically Signed   By: Norva Pavlov M.D.   On: 06/03/2017 14:00   Dg Chest 2 View  Result Date: 05/25/2017 CLINICAL DATA:  Pre  right knee total arthroplasty evaluation. EXAM: CHEST  2 VIEW COMPARISON:  09/03/2013. FINDINGS: Normal sized heart. Stable linear scarring in the left lower lung zone. Clear right lung. Central peribronchial thickening without significant change. Minimal scoliosis and minimal thoracic spine degenerative changes. IMPRESSION: No acute abnormality. Stable mild to moderate chronic bronchitic changes. Electronically Signed   By: Beckie Salts M.D.   On: 05/25/2017 14:59   Dg Chest Port 1 View  Result Date: 06/02/2017 CLINICAL DATA:  66 y/o  F; fever. EXAM: PORTABLE CHEST 1 VIEW COMPARISON:  05/25/2017 chest radiograph FINDINGS: Stable normal cardiac silhouette. Right cardiophrenic angle opacity may represent an area of consolidation. No pleural effusion or pneumothorax identified. Bones are unremarkable. IMPRESSION: Right cardiophrenic angle opacity may represent an area of consolidation. Consider PA and lateral radiographs for better characterization. Electronically Signed   By: Mitzi Hansen M.D.   On: 06/02/2017 20:49    Time Spent in minutes  30   Pearson Grippe M.D on 06/04/2017 at 7:46 AM  Between 7am to 7pm - Pager - [phone]  After 7pm go to [email or web address] - password [facility]  [facility] -  Office  [phone]

## 2017-06-04 NOTE — Social Work (Signed)
CSW met with patient to f/u as she was recently Dc to [facility] over the weekend and has returned. No issues with SNF placement and patient indicated that she wld like to return.  CSW spoke with Tammy at [facility] and she indicated she return once cleared. CSW will send DC summary.

## 2017-06-04 NOTE — Progress Notes (Signed)
Pt seen Pain ok - on observation Plan dc to snf tomorrow cpm tonight

## 2017-06-04 NOTE — Progress Notes (Signed)
Orthopedic Tech Progress Note Patient Details:  Gloria HeadsDorothy J Patterson [birth year] [id number]  CPM Right Knee CPM Right Knee: Off Right Knee Flexion (Degrees): 45 Right Knee Extension (Degrees): 0 Additional Comments: CPM placed at pt bedside for later use/pt declined use of cpm pt was up in chair.    Alvina ChouWilliams, Kitt Minardi C 06/04/2017, 6:27 PM

## 2017-06-05 LAB — GLUCOSE, CAPILLARY
GLUCOSE-CAPILLARY: 75 mg/dL (ref 65–99)
Glucose-Capillary: 192 mg/dL — ABNORMAL HIGH (ref 65–99)

## 2017-06-05 LAB — PROCALCITONIN: PROCALCITONIN: 0.12 ng/mL

## 2017-06-05 MED ORDER — LEVOFLOXACIN 500 MG PO TABS: 500.0000 mg | ORAL_TABLET | Freq: Every day | ORAL | 0 refills | Status: AC

## 2017-06-05 NOTE — Social Work (Signed)
Clinical Social Worker facilitated patient discharge including confirmed with SNF about patient discharge plans.  Clinical information faxed to facility and family agreeable with plan.    CSW [facility].  RN to call [phone] to give report prior to discharge.  Clinical Social Worker will sign off for now as social work intervention is no longer needed. Please consult us again if new need arises.  Keene BreathPatricia Jacqualine Weichel, LCSW Clinical Social Worker [phone]

## 2017-06-05 NOTE — Evaluation (Signed)
Physical Therapy Evaluation Patient Details Name: Gloria Patterson MRN: [id number] DOB: 1951/12/07 Today's Date: 06/05/2017   History of Present Illness  66 y.o. female with medical history significant of seizures, HTN, h/o polysubstance abuse, hepatitis C, GERD, DM and recent hospitalization for R TKR. Pt admitted for HCAP  Clinical Impression  Pt mobility limited by pain and nausea. Pt unable to complete quad set or achieve >45 deg knee flexion AA. Pt unable to ambulate due to dizziness.  BP 146/67. Acute PT to follow.    Follow Up Recommendations SNF    Equipment Recommendations  None recommended by PT    Recommendations for Other Services       Precautions / Restrictions Precautions Precautions: Knee Precaution Booklet Issued: No Precaution Comments: pt with poor recall and didn't care to be re-educated Restrictions Weight Bearing Restrictions: No RLE Weight Bearing: Weight bearing as tolerated      Mobility  Bed Mobility Overal bed mobility: Needs Assistance Bed Mobility: Sit to Supine       Sit to supine: Min guard   General bed mobility comments: pt impulsively returned to bed and had labored effort in bringing LEs back into bed  Transfers Overall transfer level: Needs assistance Equipment used: Rolling walker (2 wheeled) Transfers: Sit to/from [employer]Stand;Stand Pivot Transfers Sit to Stand: Min assist Stand pivot transfers: Min assist       General transfer comment: v/c's for hand placement, minA for walker management  Ambulation/Gait Ambulation/Gait assistance: Min assist Ambulation Distance (Feet):  (3 steps) Assistive device: Rolling walker (2 wheeled) Gait Pattern/deviations: Step-to pattern;Decreased step length - right;Decreased step length - left;Decreased weight shift to right;Shuffle Gait velocity: slow   General Gait Details: pt began ambulation and then reported "I'm about to fall, Im dizzy." pt impuslivly reached for bed and sat down  requiring modA to prevent fall. pts BP 146/67. RN made aware.  Stairs            Wheelchair Mobility    Modified Rankin (Stroke Patients Only)       Balance Overall balance assessment: Needs assistance         Standing balance support: Bilateral upper extremity supported Standing balance-Leahy Scale: Poor Standing balance comment: dependent on RW                             Pertinent Vitals/Pain Pain Assessment: 0-10 Pain Score: 7  Pain Location: R knee Pain Descriptors / Indicators: Constant Pain Intervention(s): Monitored during session    Home Living Family/patient expects to be discharged to:: Skilled nursing facility                 Additional Comments: [facility] was at [facility], plans to return there    Prior Function Level of Independence: Needs assistance   Gait / Transfers Assistance Needed: was working with PT  ADL's / Homemaking Assistance Needed: assisted by staff for all ADLs  Comments: was only at [facility] night     Hand Dominance   Dominant Hand: Right    Extremity/Trunk Assessment   Upper Extremity Assessment Upper Extremity Assessment: Overall WFL for tasks assessed    Lower Extremity Assessment Lower Extremity Assessment: RLE deficits/detail RLE Deficits / Details: trace quad set    Cervical / Trunk Assessment Cervical / Trunk Assessment: Normal  Communication   Communication: No difficulties  Cognition Arousal/Alertness: Awake/alert Behavior During Therapy: Impulsive Overall Cognitive Status: Impaired/Different from baseline Area of  Impairment: Safety/judgement                         Safety/Judgement: Decreased awareness of safety;Decreased awareness of deficits            General Comments General comments (skin integrity, edema, etc.): dressing intact on R knee    Exercises Total Joint Exercises Ankle Circles/Pumps: AROM;Both;10 reps;Seated Quad Sets: AROM;Right;10  reps;Seated Heel Slides: AAROM;Right;10 reps;Seated   Assessment/Plan    PT Assessment Patient needs continued PT services  PT Problem List Decreased strength;Decreased range of motion;Decreased activity tolerance;Decreased knowledge of use of DME;Pain       PT Treatment Interventions DME instruction;Gait training;Functional mobility training;Therapeutic activities;Therapeutic exercise;Balance training;Patient/family education    PT Goals (Current goals can be found in the Care Plan section)  Acute Rehab PT Goals Patient Stated Goal: stop the nausea PT Goal Formulation: With patient Time For Goal Achievement: 06/12/17 Potential to Achieve Goals: Fair    Frequency 7X/week   Barriers to discharge        Co-evaluation               AM-PAC PT "6 Clicks" Daily Activity  Outcome Measure Difficulty turning over in bed (including adjusting bedclothes, sheets and blankets)?: A Little Difficulty moving from lying on back to sitting on the side of the bed? : A Little Difficulty sitting down on and standing up from a chair with arms (e.g., wheelchair, bedside commode, etc,.)?: A Little Help needed moving to and from a bed to chair (including a wheelchair)?: A Lot Help needed walking in hospital room?: A Lot Help needed climbing 3-5 steps with a railing? : A Lot 6 Click Score: 15    End of Session Equipment Utilized During Treatment: Gait belt Activity Tolerance:  (limited by dizzines) Patient left: in bed;with call bell/phone within reach;with bed alarm set Nurse Communication: Mobility status PT Visit Diagnosis: Other abnormalities of gait and mobility (R26.89);Muscle weakness (generalized) (M62.81);Pain;Difficulty in walking, not elsewhere classified (R26.2) Pain - Right/Left: Right Pain - part of body: Knee    Time: 1202-1216 PT Time Calculation (min) (ACUTE ONLY): 14 min   Charges:   PT Evaluation $PT Eval Moderate Complexity: 1 Procedure     PT G CodesLewis Shock, PT, DPT Pager #: [phone] Office #: [phone]   Cicily Bonano M Lyanna Blystone 06/05/2017, 3:25 PM

## 2017-06-05 NOTE — Progress Notes (Signed)
Orthopedic Tech Progress Note Patient Details:  Threasa HeadsDorothy J Mabile 10/24/1951 [id number]  CPM Right Knee CPM Right Knee: On Right Knee Flexion (Degrees): 33 Right Knee Extension (Degrees): 0 Additional Comments: pt placed on cpm at 0-33 degrees.  right knee/leg.   Alvina ChouWilliams, Khalani Novoa C 06/05/2017, 1:30 PM

## 2017-06-05 NOTE — Progress Notes (Signed)
Report given to staff nurse, Babs Sciaraerra  [facility]. [facility], awaiting transport.

## 2017-06-05 NOTE — Discharge Summary (Signed)
Gloria HeadsDorothy J Patterson, is a 66 y.o. female  DOB 1951/12/06  MRN [id number].  Admission date:  06/02/2017  Admitting Physician  Gloria BlueJennifer Yates, MD  Discharge Date:  06/05/2017   Primary MD  Gloria Patterson, Alehegn, MD  Recommendations for primary care physician for things to follow:    Sepsis presumably from HCAP Blood culture ngtd Completed 3 days of vanco iv, cefepime iv Tapered to Levaquin 500mg  po qday , needs 5 more days Please check cbc, cmp in 3-5 days  Narcotic withdrawal -Patient with past h/o polysubstance abuse -She was treated with narcotics while inpatient but was given a hand-written Rx for methadone at the time of discharge -The patient was c/o severe pain and apparently difficult to control her pain in the ER -Suspect narcotic withdrawal Doing well on dilaudid 4mg  po q4h, may be better to discharge to SNF on this rather than methadone which is more difficult to procure.   S/p TKR -PT at SNF -cont Xarelto  DM Cont Metformin   HTN -Continue Clonidine, although this is an unusual choice for monotherapy for HTN    Admission Diagnosis  HAP (hospital-acquired pneumonia) [J18.9] Fever in adult [R50.9] Status post total right knee replacement [Z96.651]   Discharge Diagnosis  HAP (hospital-acquired pneumonia) [J18.9] Fever in adult [R50.9] Status post total right knee replacement [Z96.651]    Principal Problem:   HCAP (healthcare-associated pneumonia) Active Problems:   Narcotic withdrawal (HCC)   Essential hypertension   Sepsis (HCC)   S/P TKR (total knee replacement), right   Type 2 diabetes mellitus without complication (HCC)      Past Medical History:  Diagnosis Date  . Arthritis    knee, shouder  . Asthma    MILD  . Chronic cough   . Constipation   . Diabetes mellitus without complication (HCC)    Type II  . Frequency of urination   . GERD (gastroesophageal  reflux disease)   . Hepatitis    Hepatitis C treated   . 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 History of kidney stones    passed  . Hypertension   . Nocturia   . 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  Patient not sure why she was given Phenobarital,   " before 1978"  . Urgency of urination   . Vaginal vault prolapse    ANTERIOR    Past Surgical History:  Procedure Laterality Date  . ANTERIOR AND POSTERIOR REPAIR N/A 03/10/2013   Procedure: ANTERIOR (CYSTOCELE) AND POSTERIOR REPAIR (RECTOCELE);  Surgeon: Gloria LudwigSigmund I Tannenbaum, MD;  [facility];  Service: Urology;  Laterality: N/A;   [employer] UPHOLD LITE ANTERIOR VAULT REPAIR  POSSIBLE OP WITH OBSERVATION POSSIBLE FLOOR BED  Uphold LITE w/[employer] Catalog number [id number] qty 2 Xenform soft  tissue repair matrix 6x7 cm [id number]   . BREAST REDUCTION SURGERY    . BREAST SURGERY     B/L removal of benign cysts  . COLONOSCOPY W/ POLYPECTOMY    . CYSTOCELE REPAIR N/A 03/10/2013   Procedure:  Anterior vaginal vault prolapse repair, with [employer] uphold light sacrospinous fixation using U. device and mesh repair with Gloria Patterson plication   ;  Surgeon: Gloria Ludwig, MD;  Location[facility];  Service: Urology;  Laterality: N/A;  . ESOPHAGOGASTRODUODENOSCOPY    . TOTAL KNEE ARTHROPLASTY Left 04/08/2013   Procedure: TOTAL KNEE ARTHROPLASTY;  Surgeon: Gloria Copa, MD;  Location[facility] OR;  Service: Orthopedics;  Laterality: Left;  Left total knee arthroplasty  . TOTAL KNEE ARTHROPLASTY Right 05/31/2017   Procedure: RIGHT TOTAL KNEE ARTHROPLASTY;  Surgeon: Gloria Copa, MD;  Location: [facility] OR;  Service: Orthopedics;  Laterality: Right;  . TUBAL LIGATION         HPI  from the history and physical done on the day  of admission:   66 y.o.femalewith medical history significant of seizures, HTN, h/o polysubstance abuse, hepatitis C, GERD, DM and recent hospitalization for R TKR. She was hospitalized from 6/14-16 and was discharged earlier today to [facility]. At the time of my evaluation, the patient was obtunded from recent Dilaudid dosing and unable to answer questions. The triage nurse noted that she was moaning and crying out in pain from knee after having arrived at the SNF and not having been started on her chronic Methadone yet.     ED Course:In review of notes, patient had a fever to 101.5 the day prior to discharge. Current temp 103. Given 1 mg Dilaudid for pain and then again with 1 mg Ativan. Concern for PNA. Dr. Ophelia Patterson (covering for Dr. August Patterson) consulted and requests medicine admission; they will consult in AM.       Hospital Course:    Pt admitted due to inability to get pain medication at SNF and for hcap, and empically started on vanco/cefepime, blood culture 6/16=> ngtd.  CXR 6/17=> suspect right lower lung infilitrate.  Pt has been afebrile.  Pt tapered to levaquin.  Pt denies fever, chills, cp, palp, sob currently .pt feeling well and would like to go back to SNF.     Follow UP  Follow-up Information    Asres, Alehegn, MD Follow up in 1 week(s).   Specialty:  Family Medicine Contact information: [address] [place] [address] [phone]            Consults obtained - orthopedics  Discharge Condition: stable  Diet and Activity recommendation: See Discharge Instructions below  Discharge Instructions      Discharge Medications     Allergies as of 06/05/2017      Reactions   Phenobarbital Other (See Comments)    ? SEIZURES ?  [ QUESTION IF SZs RESULT FROM PHENOBARB ? ]       Medication List    STOP taking these medications   METHADONE HCL PO   traMADol 50 MG tablet Commonly known as:  ULTRAM     TAKE these  medications   atorvastatin 20 MG tablet Commonly known as:  LIPITOR Take 20 mg by mouth at bedtime.   bisacodyl 10 MG suppository Commonly known as:  DULCOLAX Place 1 suppository (10 mg total) rectally daily as needed for moderate constipation.   cloNIDine 0.1 MG tablet Commonly known as:  CATAPRES Take  1 tablet (0.1 mg total) by mouth 2 (two) times daily.   ESTROVEN PO Take 1 tablet by mouth daily.   HYDROmorphone 4 MG tablet Commonly known as:  DILAUDID Take 1 tablet (4 mg total) by mouth every 4 (four) hours as needed for severe pain.   levofloxacin 500 MG tablet Commonly known as:  LEVAQUIN Take 1 tablet (500 mg total) by mouth daily.   metFORMIN 500 MG tablet Commonly known as:  GLUCOPHAGE TAKE 1 TABLET BY MOUTH EVERY DAY WITH FOOD - OFFICE VISIT BEFORE NEXT REFILL   methocarbamol 500 MG tablet Commonly known as:  ROBAXIN Take 1 tablet (500 mg total) by mouth every 6 (six) hours as needed for muscle spasms.   multivitamin with minerals Tabs tablet Take 1 tablet by mouth daily.   omeprazole 40 MG capsule Commonly known as:  PRILOSEC Take 1 capsule (40 mg total) by mouth daily.   ondansetron 4 MG/2ML Soln injection Commonly known as:  ZOFRAN Inject 2 mLs (4 mg total) into the vein every 6 (six) hours as needed for nausea or vomiting.   polyethylene glycol packet Commonly known as:  MIRALAX / GLYCOLAX Take 17 g by mouth daily as needed for mild constipation.   PROVENTIL HFA 108 (90 Base) MCG/ACT inhaler Generic drug:  albuterol Inhale 2 puffs into the lungs every 4 (four) hours as needed for wheezing or shortness of breath.   rivaroxaban 10 MG Tabs tablet Commonly known as:  XARELTO Take 1 tablet (10 mg total) by mouth daily with breakfast.   triamcinolone cream 0.1 % Commonly known as:  KENALOG Apply 1 application topically daily as needed for itching or rash.       Major procedures and Radiology Reports - PLEASE review detailed and final reports for  all details, in brief -      Dg Chest 2 View  Result Date: 06/03/2017 CLINICAL DATA:  Pneumonia. EXAM: CHEST  2 VIEW COMPARISON:  06/02/2017 FINDINGS: Heart size is normal. There is lower lobe density best seen on the lateral view and favored to be within the medial aspect of the right lower lobe. There is mild perihilar peribronchial thickening. IMPRESSION: Suspect right lower lobe infiltrate. Electronically Signed   By: Norva Pavlov M.D.   On: 06/03/2017 14:00   Dg Chest 2 View  Result Date: 05/25/2017 CLINICAL DATA:  Pre right knee total arthroplasty evaluation. EXAM: CHEST  2 VIEW COMPARISON:  09/03/2013. FINDINGS: Normal sized heart. Stable linear scarring in the left lower lung zone. Clear right lung. Central peribronchial thickening without significant change. Minimal scoliosis and minimal thoracic spine degenerative changes. IMPRESSION: No acute abnormality. Stable mild to moderate chronic bronchitic changes. Electronically Signed   By: Beckie Salts M.D.   On: 05/25/2017 14:59   Dg Chest Port 1 View  Result Date: 06/02/2017 CLINICAL DATA:  66 y/o  F; fever. EXAM: PORTABLE CHEST 1 VIEW COMPARISON:  05/25/2017 chest radiograph FINDINGS: Stable normal cardiac silhouette. Right cardiophrenic angle opacity may represent an area of consolidation. No pleural effusion or pneumothorax identified. Bones are unremarkable. IMPRESSION: Right cardiophrenic angle opacity may represent an area of consolidation. Consider PA and lateral radiographs for better characterization. Electronically Signed   By: Mitzi Hansen M.D.   On: 06/02/2017 20:49    Micro Results     Recent Results (from the past 240 hour(s))  Blood culture (routine x 2)     Status: None (Preliminary result)   Collection Time: 06/02/17  8:34 PM  Result Value  Ref Range Status   Specimen Description BLOOD RIGHT ANTECUBITAL  Final   Special Requests   Final    BOTTLES DRAWN AEROBIC ONLY Blood Culture adequate volume    Culture NO GROWTH 2 DAYS  Final   Report Status PENDING  Incomplete  Blood culture (routine x 2)     Status: None (Preliminary result)   Collection Time: 06/02/17  8:49 PM  Result Value Ref Range Status   Specimen Description BLOOD RIGHT HAND  Final   Special Requests   Final    BOTTLES DRAWN AEROBIC AND ANAEROBIC Blood Culture adequate volume   Culture NO GROWTH 2 DAYS  Final   Report Status PENDING  Incomplete       Today   Subjective    Gloria Patterson today has been afebrile, denies cough, cp, palp,sob.   no headache,no chest abdominal pain,no new weakness tingling or numbness, feels much better wants to go to SNF today.    Objective   Blood pressure (!) 176/88, pulse 89, temperature 99.3 F (37.4 C), temperature source Oral, resp. rate 18, SpO2 100 %.   Intake/Output Summary (Last 24 hours) at 06/05/17 0745 Last data filed at 06/04/17 1527  Gross per 24 hour  Intake          3168.75 ml  Output             1250 ml  Net          1918.75 ml    Exam Awake Alert, Oriented x 3, No new F.N deficits, Normal affect [place].AT,PERRAL Supple Neck,No JVD, No cervical lymphadenopathy appriciated.  Symmetrical Chest wall movement, Good air movement bilaterally, CTAB RRR,No Gallops,Rubs or new Murmurs, No Parasternal Heave +ve B.Sounds, Abd Soft, Non tender, No organomegaly appriciated, No rebound -guarding or rigidity. No Cyanosis, Clubbing or edema, No new Rash or bruise   Data Review   CBC w Diff:  Lab Results  Component Value Date   WBC 13.8 (H) 06/03/2017   HGB 10.6 (L) 06/03/2017   HCT 32.1 (L) 06/03/2017   PLT 177 06/03/2017   LYMPHOPCT 32 06/03/2017   MONOPCT 9 06/03/2017   EOSPCT 0 06/03/2017   BASOPCT 0 06/03/2017    CMP:  Lab Results  Component Value Date   NA 141 06/03/2017   K 3.7 06/03/2017   CL 108 06/03/2017   CO2 26 06/03/2017   BUN 5 (L) 06/03/2017   CREATININE 0.62 06/03/2017   PROT 7.6 05/25/2017   ALBUMIN 3.8 05/25/2017   BILITOT 0.8  05/25/2017   ALKPHOS 86 05/25/2017   AST 53 (H) 05/25/2017   ALT 40 05/25/2017  .   Total Time in preparing paper work, data evaluation and todays exam - 35 minutes  Pearson Grippe M.D on 06/05/2017 at 7:45 AM  [facility]   Office  [phone]

## 2017-06-05 NOTE — Progress Notes (Signed)
Pt transport ([facility]) here to pick pt up. D/c instructions given to transporters for facility. Pt doing well this afternoon, no nausea, ate some lunch, taking pain medication without problem, no nausea. D/c'd with her belongings.

## 2017-06-05 NOTE — Clinical Social Work Placement (Signed)
   CLINICAL SOCIAL WORK PLACEMENT  NOTE  Date:  06/05/2017  Patient Details  Name: Gloria Patterson MRN: [id number] Date of Birth: 1951-01-03  Clinical Social Work is seeking post-discharge placement for this patient at the Skilled  Nursing Facility level of care (*CSW will initial, date and re-position this form in  chart as items are completed):  Yes   Patient/family provided with [place] Clinical Social Work Department's list of facilities offering this level of care within the geographic area requested by the patient (or if unable, by the patient's family).  Yes   Patient/family informed of their freedom to choose among providers that offer the needed level of care, that participate in Medicare, Medicaid or managed care program needed by the patient, have an available bed and are willing to accept the patient.  Yes   Patient/family informed of [facility]'s ownership interest in [facility] and [facility], as well as of the fact that they are under no obligation to receive care at these facilities.  PASRR submitted to EDS on       PASRR number received on 06/01/17     Existing PASRR number confirmed on 06/01/17     FL2 transmitted to all facilities in geographic area requested by pt/family on 06/01/17     FL2 transmitted to all facilities within larger geographic area on 05/25/17     Patient informed that his/her managed care company has contracts with or will negotiate with certain facilities, including the following:        Yes   Patient/family informed of bed offers received.  Patient [facility]     Physician recommends and patient chooses bed at      [facility] on 06/05/17.  Patient to be transferred to facility by [facility]     Patient family notified on 06/05/17 of transfer.  Name of family member notified:  patient responsible for self and will  advise family     PHYSICIAN Please prepare priority discharge summary, including medications, Please prepare prescriptions, Please sign FL2     Additional Comment:    _______________________________________________ Tresa MoorePatricia V Cherica Heiden, LCSW 06/05/2017, 12:57 PM

## 2017-06-07 LAB — CULTURE, BLOOD (ROUTINE X 2)
Culture: NO GROWTH
Culture: NO GROWTH
SPECIAL REQUESTS: ADEQUATE
SPECIAL REQUESTS: ADEQUATE

## 2017-06-11 ENCOUNTER — Telehealth (INDEPENDENT_AMBULATORY_CARE_PROVIDER_SITE_OTHER): Payer: Self-pay | Admitting: Orthopedic Surgery

## 2017-06-11 ENCOUNTER — Telehealth (INDEPENDENT_AMBULATORY_CARE_PROVIDER_SITE_OTHER): Payer: Self-pay | Admitting: Radiology

## 2017-06-11 NOTE — Telephone Encounter (Signed)
Okay from my perspective to be put back on methadone.  That really though is a institutional decision for the nursing home.  From my perspective that I am in favor of doing that.  She will have to stop other pain medicine.  Please call thanks

## 2017-06-11 NOTE — Telephone Encounter (Signed)
Patient called asking to be put back on Methadone, she is currently in a lot of pain. CB # [phone]

## 2017-06-11 NOTE — Telephone Encounter (Signed)
Please advise 

## 2017-06-12 NOTE — Telephone Encounter (Signed)
Patients PCP is coming to evaluate her today per nurse that is caring for patient at facility

## 2017-06-14 ENCOUNTER — Inpatient Hospital Stay (INDEPENDENT_AMBULATORY_CARE_PROVIDER_SITE_OTHER): Payer: Medicare Other | Admitting: Orthopedic Surgery

## 2017-06-18 ENCOUNTER — Ambulatory Visit (INDEPENDENT_AMBULATORY_CARE_PROVIDER_SITE_OTHER): Payer: Medicare Other

## 2017-06-18 ENCOUNTER — Ambulatory Visit (INDEPENDENT_AMBULATORY_CARE_PROVIDER_SITE_OTHER): Payer: Medicare Other | Admitting: Orthopedic Surgery

## 2017-06-18 DIAGNOSIS — Z96651 Presence of right artificial knee joint: Secondary | ICD-10-CM

## 2017-06-18 NOTE — Progress Notes (Signed)
Post-Op Visit Note   Patient: Gloria Patterson           Date of Birth: 1951-12-14           MRN: [id number] Visit Date: 06/18/2017 PCP: Ananias Pilgrim, MD   Assessment & Plan:  Chief Complaint:  Chief Complaint  Patient presents with  . Right Knee - Routine Post Op   Visit Diagnoses:  1. S/P TKR (total knee replacement), right     Plan: Gloria Patterson is a 66 year old patient 2 weeks out right total knee replacement.  In general she's doing well but she's having some issues with her pain and pain medicine.  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  She is at the nursing home.  On exam she's lacking about 15 of full extension has about 60 of flexion.  Radiographs look good incision looks good land this time is to continue with therapy really working on full extension.  She is able to walk.  I'll see her back in 4 weeks for clinical recheck.  Follow-Up Instructions: No Follow-up on file.   Orders:  Orders Placed This Encounter  Procedures  . XR Knee 1-2 Views Right   No orders of the defined types were placed in this encounter.   Imaging: Xr Knee 1-2 Views Right  Result Date: 06/18/2017 AP lateral right knee reviewed.  Prosthesis in good position.  No perioperative fractures noted.  Normal postop total knee replacement   PMFS History: Patient Active Problem List   Diagnosis Date Noted  . HCAP (healthcare-associated pneumonia) 06/03/2017  . Sepsis (HCC) 06/03/2017  . S/P TKR (total knee replacement), right 06/03/2017  . Type 2 diabetes mellitus without complication (HCC) 06/03/2017  . Arthritis of knee 05/31/2017  . Essential hypertension 08/25/2013  . GERD (gastroesophageal reflux disease) 05/23/2013  . Narcotic withdrawal (HCC) 05/23/2013  . Acute bronchitis 05/23/2013  . Osteoarthritis of left knee 04/08/2013    Class: Diagnosis of  . Upper airway cough syndrome 10/16/2012   Past Medical History:  Diagnosis Date  . Arthritis    knee, shouder  . Asthma    MILD  .  Chronic cough   . Constipation   . Diabetes mellitus without complication (HCC)    Type II  . Frequency of urination   . GERD (gastroesophageal reflux disease)   . Hepatitis    Hepatitis C treated   . 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 History of kidney stones    passed  . Hypertension   . Nocturia   . 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  Patient not sure why she was given Phenobarital,   " before 1978"  . Urgency of urination   . Vaginal vault prolapse    ANTERIOR    Family History  Problem Relation Age of Onset  . Prostate cancer Father   . Breast cancer Sister     Past Surgical History:  Procedure Laterality Date  . ANTERIOR AND POSTERIOR REPAIR N/A 03/10/2013   Procedure: ANTERIOR (CYSTOCELE) AND POSTERIOR REPAIR (RECTOCELE);  Surgeon: Kathi Ludwig, MD;  Location: [facility];  Service: Urology;  Laterality: N/A;   BO[employer]ON [employer] UPHOLD LITE ANTERIOR VAULT REPAIR  POSSIBLE OP WITH OBSERVATION POSSIBLE FLOOR BED  Uphold LITE w/Capio [employer] number  [id number] qty 2 Xenform soft tissue repair matrix 6x7 cm [id number]   . BREAST REDUCTION SURGERY    . BREAST SURGERY     B/L removal of benign cysts  . COLONOSCOPY W/ POLYPECTOMY    . CYSTOCELE REPAIR N/A 03/10/2013   Procedure:  Anterior vaginal vault prolapse repair, with [employer] uphold light sacrospinous fixation using U. device and mesh repair with Tresa EndoKelly plication   ;  Surgeon: Kathi LudwigSigmund I Tannenbaum, MD;  [facility] [place];  Service: Urology;  Laterality: N/A;  . ESOPHAGOGASTRODUODENOSCOPY    . TOTAL KNEE ARTHROPLASTY Left 04/08/2013   Procedure: TOTAL KNEE ARTHROPLASTY;  Surgeon: Cammy CopaGregory Scott Dylon Correa, MD;  [facility];  Service: Orthopedics;  Laterality: Left;  Left total knee arthroplasty  . TOTAL KNEE  ARTHROPLASTY Right 05/31/2017   Procedure: RIGHT TOTAL KNEE ARTHROPLASTY;  Surgeon: Cammy Copaean, Scott Jream Broyles, MD;  [facility];  Service: Orthopedics;  Laterality: Right;  . TUBAL LIGATION     Social History   Occupational History  . Not on file.   Social History Main Topics  . Smoking status: Never Smoker  . Smokeless tobacco: Never Used     Comment: CANNOT HAVE NARCOTIC MEDICATIONS PER PT  . Alcohol use No  . Drug use: No     Comment: Stopped using  Cocoine and Heroin 2011  . Sexual activity: No

## 2017-06-29 ENCOUNTER — Telehealth (INDEPENDENT_AMBULATORY_CARE_PROVIDER_SITE_OTHER): Payer: Self-pay

## 2017-06-29 NOTE — Telephone Encounter (Signed)
Revonda StandardAllison with [facility] would like verbal orders for patient to have HHPT for  1 x week for 1 week, 3 x week for 2 weeks, and 2 x week for 2 weeks.Please advise.  Thank You.  Cb# is [phone].

## 2017-06-29 NOTE — Telephone Encounter (Signed)
IC verbal order given.  °

## 2017-07-10 ENCOUNTER — Telehealth (INDEPENDENT_AMBULATORY_CARE_PROVIDER_SITE_OTHER): Payer: Self-pay

## 2017-07-10 NOTE — Telephone Encounter (Signed)
IC advised patient.  

## 2017-07-10 NOTE — Telephone Encounter (Signed)
No she is on pain medicine like from the pain clinic

## 2017-07-10 NOTE — Telephone Encounter (Signed)
Patient would like to know if she can get  Rx refill for Hydromorphone.  States that she is still having pain.  CB# is [phone].  Please advise.

## 2017-07-10 NOTE — Telephone Encounter (Signed)
Please advise 

## 2017-07-18 ENCOUNTER — Ambulatory Visit (INDEPENDENT_AMBULATORY_CARE_PROVIDER_SITE_OTHER): Payer: Medicare Other | Admitting: Orthopedic Surgery

## 2017-07-18 ENCOUNTER — Encounter (INDEPENDENT_AMBULATORY_CARE_PROVIDER_SITE_OTHER): Payer: Self-pay | Admitting: Orthopedic Surgery

## 2017-07-18 DIAGNOSIS — Z96651 Presence of right artificial knee joint: Secondary | ICD-10-CM

## 2017-07-18 NOTE — Progress Notes (Signed)
Wound Care Note   Patient: Gloria Patterson           Date of Birth: [birth year]           MRN: [id number]             PCP: Ananias PilgrimAsres, Alehegn, MD Visit Date: 07/18/2017   Assessment & Plan: Visit Diagnoses: No diagnosis found.  Plan: Gloria CellaDorothy is now 6 weeks out right total knee replacement.  She's doing well.  That she's having some pain in the left total knee.  On exam the right total knee has flexion and 90 near full extension intact extensor mechanism no effusion, negative [facility] week return for clinical recheck and release   Follow-Up Instructions: Return in about 6 weeks (around 08/29/2017).  Orders:  No orders of the defined types were placed in this encounter.  No orders of the defined types were placed in this encounter.     Procedures: No notes on file   Clinical Data: No additional findings.   No images are attached to the encounter.   Subjective: Chief Complaint  Patient presents with  . Right Knee - Follow-up, Routine Post Op  . Left Knee - Pain    HPI  Review of Systems  Miscellaneous:  -Home Health Care: See above  -Physical Therapy: See above  -Out of Work?:  See above  -Worker's Compensation Case?:  See above  -Additional Information: See above   Objective: Vital Signs: There were no vitals taken for this visit.  Physical Exam: See above  Specialty Comments: No specialty comments available.   PMFS History: Patient Active Problem List   Diagnosis Date Noted  . HCAP (healthcare-associated pneumonia) 06/03/2017  . Sepsis (HCC) 06/03/2017  . S/P TKR (total knee replacement), right 06/03/2017  . Type 2 diabetes mellitus without complication (HCC) 06/03/2017  . Arthritis of knee 05/31/2017  . Essential hypertension 08/25/2013  . GERD (gastroesophageal reflux disease) 05/23/2013  . Narcotic withdrawal (HCC) 05/23/2013  . Acute bronchitis 05/23/2013  . Osteoarthritis of left knee 04/08/2013   Class: Diagnosis of  . Upper airway cough syndrome 10/16/2012   Past Medical History:  Diagnosis Date  . Arthritis    knee, shouder  . Asthma    MILD  . Chronic cough   . Constipation   . Diabetes mellitus without complication (HCC)    Type II  . Frequency of urination   . GERD (gastroesophageal reflux disease)   . Hepatitis    Hepatitis C treated   . 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 History of kidney stones    passed  . Hypertension   . Nocturia   . 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  Patient not sure why she was given Phenobarital,   " before 1978"  . Urgency of urination   . Vaginal vault prolapse    ANTERIOR    Family History  Problem Relation Age of Onset  . Prostate cancer Father   . Breast cancer Sister    Past Surgical History:  Procedure Laterality Date  . ANTERIOR AND POSTERIOR REPAIR N/A 03/10/2013   Procedure: ANTERIOR (CYSTOCELE) AND POSTERIOR REPAIR (RECTOCELE);  Surgeon: Kathi LudwigSigmund I Tannenbaum, MD;  [facility] [place];  Service:  Urology;  Laterality: N/A;   [employer] UPHOLD LITE ANTERIOR VAULT REPAIR  POSSIBLE OP WITH OBSERVATION POSSIBLE FLOOR BED  Uphold LITE w/Capio SLIM Catalog number [id number] qty 2 Xenform soft tissue repair matrix 6x7 cm [id number]   . BREAST REDUCTION SURGERY    . BREAST SURGERY     B/L removal of benign cysts  . COLONOSCOPY W/ POLYPECTOMY    . CYSTOCELE REPAIR N/A 03/10/2013   Procedure:  Anterior vaginal vault prolapse repair, with [employer] uphold light sacrospinous fixation using U. device and mesh repair with Tresa EndoKelly plication   ;  Surgeon: Kathi LudwigSigmund I Tannenbaum, MD;  [facility] [place];  Service: Urology;  Laterality: N/A;  . ESOPHAGOGASTRODUODENOSCOPY    . TOTAL KNEE ARTHROPLASTY Left 04/08/2013   Procedure:  TOTAL KNEE ARTHROPLASTY;  Surgeon: Cammy CopaGregory Scott Dean, MD;  [facility];  Service: Orthopedics;  Laterality: Left;  Left total knee arthroplasty  . TOTAL KNEE ARTHROPLASTY Right 05/31/2017   Procedure: RIGHT TOTAL KNEE ARTHROPLASTY;  Surgeon: Cammy Copaean, Scott Gregory, MD;  [facility];  Service: Orthopedics;  Laterality: Right;  . TUBAL LIGATION     Social History   Occupational History  . Not on file.   Social History Main Topics  . Smoking status: Never Smoker  . Smokeless tobacco: Never Used     Comment: CANNOT HAVE NARCOTIC MEDICATIONS PER PT  . Alcohol use No  . Drug use: No     Comment: Stopped using  Cocoine and Heroin 2011  . Sexual activity: No

## 2017-07-18 NOTE — Addendum Note (Signed)
Addended by: Prescott ParmaFIX, Krisalyn Yankowski on: 07/18/2017 12:32 PM   Modules accepted: Orders

## 2017-07-23 ENCOUNTER — Ambulatory Visit: Payer: Medicare Other | Attending: Orthopedic Surgery | Admitting: Physical Therapy

## 2017-07-23 ENCOUNTER — Other Ambulatory Visit (INDEPENDENT_AMBULATORY_CARE_PROVIDER_SITE_OTHER): Payer: Self-pay | Admitting: Orthopedic Surgery

## 2017-07-23 ENCOUNTER — Inpatient Hospital Stay (INDEPENDENT_AMBULATORY_CARE_PROVIDER_SITE_OTHER): Payer: Medicare Other | Admitting: Orthopedic Surgery

## 2017-07-23 DIAGNOSIS — M25661 Stiffness of right knee, not elsewhere classified: Secondary | ICD-10-CM | POA: Diagnosis present

## 2017-07-23 DIAGNOSIS — R2689 Other abnormalities of gait and mobility: Secondary | ICD-10-CM | POA: Diagnosis present

## 2017-07-23 DIAGNOSIS — M25561 Pain in right knee: Secondary | ICD-10-CM | POA: Insufficient documentation

## 2017-07-23 DIAGNOSIS — M25562 Pain in left knee: Secondary | ICD-10-CM | POA: Insufficient documentation

## 2017-07-23 DIAGNOSIS — R262 Difficulty in walking, not elsewhere classified: Secondary | ICD-10-CM | POA: Diagnosis present

## 2017-07-23 NOTE — Therapy (Signed)
[facility] [address]  [address] [place], [place], [address] Phone: [phone]   Fax:  [phone]  Physical Therapy Evaluation  Patient Details  Name: Gloria Patterson MRN: [id number] Date of Birth: 12/13/1951 Referring Provider: Dr. Cammy Copa  Encounter Date: 07/23/2017      PT End of Session - 07/23/17 1641    Visit Number 1   Number of Visits 12   Date for PT Re-Evaluation 09/03/17   Authorization Type Medicare/Medicaid   PT Start Time 1531   PT Stop Time 1610   PT Time Calculation (min) 39 min   Activity Tolerance Patient tolerated treatment well   Behavior During Therapy [facility] for tasks assessed/performed      Past Medical History:  Diagnosis Date  . Arthritis    knee, shouder  . Asthma    MILD  . Chronic cough   . Constipation   . Diabetes mellitus without complication (HCC)    Type II  . Frequency of urination   . GERD (gastroesophageal reflux disease)   . Hepatitis    Hepatitis C treated   . 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 History of kidney stones    passed  . Hypertension   . Nocturia   . 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  Patient not sure why she was given Phenobarital,   " before 1978"  . Urgency of urination   . Vaginal vault prolapse    ANTERIOR    Past Surgical History:  Procedure Laterality Date  . ANTERIOR AND POSTERIOR REPAIR N/A 03/10/2013   Procedure: ANTERIOR (CYSTOCELE) AND POSTERIOR REPAIR (RECTOCELE);  Surgeon: Kathi Ludwig, MD;  Location: [facility];  Service: Urology;  Laterality: N/A;   BOSTON [employer] UPHOLD LITE ANTERIOR VAULT REPAIR  POSSIBLE OP WITH OBSERVATION POSSIBLE FLOOR BED  Uphold LITE w/Capio SLIM Catalog number [id number] qty 2 Xenform soft tissue repair  matrix 6x7 cm [id number]   . BREAST REDUCTION SURGERY    . BREAST SURGERY     B/L removal of benign cysts  . COLONOSCOPY W/ POLYPECTOMY    . CYSTOCELE REPAIR N/A 03/10/2013   Procedure:  Anterior vaginal vault prolapse repair, with [employer] uphold light sacrospinous fixation using U. device and mesh repair with [name] plication   ;  Surgeon: Kathi Ludwig, MD;  Location: [facility];  Service: Urology;  Laterality: N/A;  . ESOPHAGOGASTRODUODENOSCOPY    . TOTAL KNEE ARTHROPLASTY Left 04/08/2013   Procedure: TOTAL KNEE ARTHROPLASTY;  Surgeon: Cammy Copa, MD;  [facility];  Service: Orthopedics;  Laterality: Left;  Left total knee arthroplasty  . TOTAL KNEE ARTHROPLASTY Right 05/31/2017   Procedure: RIGHT TOTAL KNEE ARTHROPLASTY;  Surgeon: Cammy Copa, MD;  Location[facility];  Service: Orthopedics;  Laterality: Right;  . TUBAL LIGATION      There were no vitals filed for this visit.       Subjective Assessment - 07/23/17 1532    Subjective patient s/p R TKA on 05/31/17 - had home health PT for approx 3 weeks. Walking with [facility]PC in the community. Has some pain in distal medial thigh. L TKA approx 3 years ago - feels like it is giving her  trouble with catching/locking.    Pertinent History L TKA, asthma, HTN, DM   How long can you walk comfortably? 10 minutes   Patient Stated Goals improve function of R knee; "I want it to bend more."   Currently in Pain? Yes   Pain Score 6    Pain Location Knee   Pain Orientation Right   Pain Descriptors / Indicators Aching;Throbbing   Pain Type Surgical pain   Pain Onset More than a month ago   Pain Frequency Intermittent   Aggravating Factors  prolonged walking   Pain Relieving Factors ice, methadone            [facility] PT Assessment - 07/23/17 1537      Assessment   Medical Diagnosis s/p R TKA   Referring Provider Dr. Cammy Copa   Onset Date/Surgical Date 05/31/17   Next MD Visit --  first  week of September   Prior Therapy yes - HHPT     Precautions   Precautions None     Restrictions   Weight Bearing Restrictions No     Balance Screen   Has the patient fallen in the past 6 months No   Has the patient had a decrease in activity level because of a fear of falling?  No   Is the patient reluctant to leave their home because of a fear of falling?  No     Home Environment   Living Environment Private residence   Living Arrangements Alone   Type of Home Apartment   Home Access Elevator   Home Layout One level   Home Equipment Dewey - single point     Prior Function   Level of Independence Independent   Vocation Retired   Leisure childcare, cooking     Cognition   Overall Cognitive Status Within Functional Limits for tasks assessed     Observation/Other Assessments   Focus on Therapeutic Outcomes (FOTO)  Knee: 50 (50% limited, predicted 36% limited)     Sensation   Light Touch Appears Intact     Coordination   Gross Motor Movements are Fluid and Coordinated Yes     Posture/Postural Control   Posture/Postural Control Postural limitations   Postural Limitations Rounded Shoulders;Forward head     ROM / Strength   AROM / PROM / Strength AROM;PROM;Strength     AROM   AROM Assessment Site Knee   Right/Left Knee Right;Left   Right Knee Extension 4   Right Knee Flexion 77   Left Knee Extension 0   Left Knee Flexion 96     PROM   PROM Assessment Site Knee   Right/Left Knee Right   Right Knee Extension 3   Right Knee Flexion 80     Strength   Strength Assessment Site Hip;Knee   Right/Left Hip Right;Left   Right Hip Flexion 4-/5   Left Hip Flexion 4+/5   Right/Left Knee Right;Left   Right Knee Flexion 4-/5   Right Knee Extension 4/5   Left Knee Flexion 5/5   Left Knee Extension 5/5     Palpation   Patella mobility poor mobility all directions     Ambulation/Gait   Ambulation/Gait Yes   Ambulation/Gait Assistance 6: Modified independent  (Device/Increase time)   Ambulation Distance (Feet) 100 Feet   Assistive device Straight cane   Gait Pattern Step-through pattern;Decreased stride length;Decreased hip/knee flexion - right;Decreased weight shift to right;Antalgic   Ambulation Surface Level;Indoor  Objective measurements completed on examination: See above findings.          [facility] Adult PT Treatment/Exercise - 07/23/17 1537      Exercises   Exercises Knee/Hip     Knee/Hip Exercises: Standing   Heel Raises Both;10 reps;5 seconds     Knee/Hip Exercises: Seated   Long Arc Quad Right;10 reps   Long Arc Quad Limitations 5 sec hold     Knee/Hip Exercises: Supine   Quad Sets Right;10 reps   Quad Sets Limitations foot propped on foam roller   Heel Slides Right;10 reps   Heel Slides Limitations 5-10 sec hold   Straight Leg Raises Right;10 reps                PT Education - 07/23/17 1641    Education provided Yes   Education Details exam findings, POC, HEP   Person(s) Educated Patient   Methods Explanation;Demonstration;Handout   Comprehension Verbalized understanding;Returned demonstration;Need further instruction          PT Short Term Goals - 07/23/17 1654      PT SHORT TERM GOAL #1   Title patient to be independent with initial HEP   Status New   Target Date 08/13/17     PT SHORT TERM GOAL #2   Title patient to improve R knee PROM to 0-100   Status New   Target Date 08/13/17           PT Long Term Goals - 07/23/17 1655      PT LONG TERM GOAL #1   Title patient to be independent with advanced HEP    Status New   Target Date 09/03/17     PT LONG TERM GOAL #2   Title patient to demonstrate R active SLR with no quad lag   Status New   Target Date 09/03/17     PT LONG TERM GOAL #3   Title Patient to improve R knee AROM to 0-100   Status New   Target Date 09/03/17     PT LONG TERM GOAL #4   Title Patient to demonstrate good heel toe gait with LRAD over  various surfaces demonstrating improved functional mobility   Status New                Plan - 07/23/17 1643    Clinical Impression Statement Patient is a 66 y/o female presenting to OPPT today s/p R TKA on 05/31/17. Patient with history of L TKA approx 3 years ago which she reports increased pain at L knee since surgery. Patient today with altered gait pattern due to reduced AROM and strength at R knee. Patient given inital HEP to focus heavily on quad activation and knee extension with good tolerance. Patient to benefit from PT to address functional mobility limitations to allow for improved mobility and quality of life.    Clinical Presentation Stable   Clinical Presentation due to: no co-morbidities affecting POC   Clinical Decision Making Low   Rehab Potential Good   PT Frequency 2x / week   PT Duration 6 weeks   PT Treatment/Interventions ADLs/Self Care Home Management;Cryotherapy;Electrical Stimulation;Iontophoresis 4mg /ml Dexamethasone;Moist Heat;Ultrasound;Neuromuscular re-education;Balance training;Therapeutic exercise;Therapeutic activities;Functional mobility training;Stair training;Gait training;Patient/family education;Manual techniques;Passive range of motion;Vasopneumatic Device;Taping;Dry needling   Consulted and Agree with Plan of Care Patient      Patient will benefit from skilled therapeutic intervention in order to improve the following deficits and impairments:  Abnormal gait, Decreased activity tolerance, Decreased balance, Decreased range of motion,  Decreased mobility, Decreased strength, Difficulty walking, Pain  Visit Diagnosis: Acute pain of right knee - Plan: PT plan of care cert/re-cert  Stiffness of right knee, not elsewhere classified - Plan: PT plan of care cert/re-cert  Difficulty in walking, not elsewhere classified - Plan: PT plan of care cert/re-cert  Other abnormalities of gait and mobility - Plan: PT plan of care cert/re-cert  Left knee pain,  unspecified chronicity - Plan: PT plan of care cert/re-cert      G-Codes - 07/23/17 1658    Functional Assessment Tool Used (Outpatient Only) FOTO: 50% limited   Functional Limitation Mobility: Walking and moving around   Mobility: Walking and Moving Around Current Status [phone]([id number]) At least 40 percent but less than 60 percent impaired, limited or restricted   Mobility: Walking and Moving Around Goal Status [phone]([id number]) At least 20 percent but less than 40 percent impaired, limited or restricted       Problem List Patient Active Problem List   Diagnosis Date Noted  . HCAP (healthcare-associated pneumonia) 06/03/2017  . Sepsis (HCC) 06/03/2017  . S/P TKR (total knee replacement), right 06/03/2017  . Type 2 diabetes mellitus without complication (HCC) 06/03/2017  . Arthritis of knee 05/31/2017  . Essential hypertension 08/25/2013  . GERD (gastroesophageal reflux disease) 05/23/2013  . Narcotic withdrawal (HCC) 05/23/2013  . Acute bronchitis 05/23/2013  . Osteoarthritis of left knee 04/08/2013    Class: Diagnosis of  . Upper airway cough syndrome 10/16/2012     Kipp LaurenceStephanie R Aaron, PT, DPT 07/23/17 5:00 PM   [facility] [place] [address]  [address] [place], [place], [address] Phone: [phone]   Fax:  [phone]  Name: Threasa HeadsDorothy J Patterson MRN: [id number] Date of Birth: [birth year]

## 2017-07-23 NOTE — Patient Instructions (Signed)
Quad Set   With other leg bent, foot flat, slowly tighten muscles on thigh of straight leg while counting out loud to _5___.  Repeat _15___ times. Do __2__ sessions per day.  Heel Slide   Bend knee and pull heel toward buttocks. Hold __5-10__ seconds. Return.  Repeat _15__ times. Do __2__ sessions per day.  Strengthening: Straight Leg Raise (Phase 1)   Tighten muscles on front of right thigh, then lift leg __6-8__ inches from surface, keeping knee locked.  Repeat _15___ times per set. Do __2__ sets per session.    Long [employer] Quad   Straighten operated leg and try to hold it __5__ seconds.  Repeat __15__ times. Do __2__ sessions a day.  Heel Raise: Bilateral (Standing)   Rise on balls of feet. Repeat __15__ times per set. Do __2__ sets per session.

## 2017-07-27 ENCOUNTER — Ambulatory Visit: Payer: Medicare Other | Admitting: Physical Therapy

## 2017-07-27 DIAGNOSIS — R262 Difficulty in walking, not elsewhere classified: Secondary | ICD-10-CM

## 2017-07-27 DIAGNOSIS — R2689 Other abnormalities of gait and mobility: Secondary | ICD-10-CM

## 2017-07-27 DIAGNOSIS — M25561 Pain in right knee: Secondary | ICD-10-CM

## 2017-07-27 DIAGNOSIS — M25661 Stiffness of right knee, not elsewhere classified: Secondary | ICD-10-CM

## 2017-07-27 DIAGNOSIS — M25562 Pain in left knee: Secondary | ICD-10-CM

## 2017-07-27 NOTE — Therapy (Signed)
[facility] [address]  [address] [place], [place], [address] Phone: [phone]   Fax:  [phone]  Physical Therapy Treatment  Patient Details  Name: Gloria Patterson MRN: [id number] Date of Birth: 1951-01-30 Referring Provider: Dr. Cammy Copa  Encounter Date: 07/27/2017      PT End of Session - 07/27/17 1045    Visit Number 2   Number of Visits 12   Date for PT Re-Evaluation 09/03/17   Authorization Type Medicare/Medicaid   PT Start Time 1015   PT Stop Time 1103   PT Time Calculation (min) 48 min   Activity Tolerance Patient tolerated treatment well   Behavior During Therapy [facility] for tasks assessed/performed      Past Medical History:  Diagnosis Date  . Arthritis    knee, shouder  . Asthma    MILD  . Chronic cough   . Constipation   . Diabetes mellitus without complication (HCC)    Type II  . Frequency of urination   . GERD (gastroesophageal reflux disease)   . Hepatitis    Hepatitis C treated   . 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 History of kidney stones    passed  . Hypertension   . Nocturia   . 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  Patient not sure why she was given Phenobarital,   " before 1978"  . Urgency of urination   . Vaginal vault prolapse    ANTERIOR    Past Surgical History:  Procedure Laterality Date  . ANTERIOR AND POSTERIOR REPAIR N/A 03/10/2013   Procedure: ANTERIOR (CYSTOCELE) AND POSTERIOR REPAIR (RECTOCELE);  Surgeon: Kathi Ludwig, MD;  Location: [facility];  Service: Urology;  Laterality: N/A;   BOSTON SCI[employer]IFIC UPHOLD LITE ANTERIOR VAULT REPAIR  POSSIBLE OP WITH OBSERVATION POSSIBLE FLOOR BED  Uphold LITE w/Capio SLIM Catalog number [id number] qty 2 Xenform soft tissue repair  matrix 6x7 cm [id number]   . BREAST REDUCTION SURGERY    . BREAST SURGERY     B/L removal of benign cysts  . COLONOSCOPY W/ POLYPECTOMY    . CYSTOCELE REPAIR N/A 03/10/2013   Procedure:  Anterior vaginal vault prolapse repair, with [employer] uphold light sacrospinous fixation using U. device and mesh repair with [name] plication   ;  Surgeon: Kathi Ludwig, MD;  Location: [facility];  Service: Urology;  Laterality: N/A;  . ESOPHAGOGASTRODUODENOSCOPY    . TOTAL KNEE ARTHROPLASTY Left 04/08/2013   Procedure: TOTAL KNEE ARTHROPLASTY;  Surgeon: Cammy Copa, MD;  Location: [facility] OR;  Service: Orthopedics;  Laterality: Left;  Left total knee arthroplasty  . TOTAL KNEE ARTHROPLASTY Right 05/31/2017   Procedure: RIGHT TOTAL KNEE ARTHROPLASTY;  Surgeon: Cammy Copa, MD;  Location: [facility] OR;  Service: Orthopedics;  Laterality: Right;  . TUBAL LIGATION      There were no vitals filed for this visit.      Subjective Assessment - 07/27/17 1045    Subjective patient doing well - took pain meds prior to treatment   Pertinent History L TKA, asthma, HTN, DM   Patient Stated Goals improve function of R knee; "I want it to bend more."   Currently in Pain? No/denies  Pain Score 0-No pain                         [facility] Adult PT Treatment/Exercise - 07/27/17 1025      Knee/Hip Exercises: Aerobic   Recumbent Bike no resistance for ROM - partial revolutions     Knee/Hip Exercises: Standing   Other Standing Knee Exercises toe clears - 9" stool x 15     Knee/Hip Exercises: Seated   Long Arc Quad Right;15 reps   Long Arc Quad Limitations 5 sec hold with ball squeeze   Other Seated Knee/Hip Exercises Fitter - R LE - 2 blue bands x 15 reps   Hamstring Curl Right;15 reps   Hamstring Limitations red tband     Knee/Hip Exercises: Supine   Heel Slides AAROM;Right;10 reps   Heel Slides Limitations pillowcase and assist form PT   Bridges Limitations 15  reps   Straight Leg Raises Right;15 reps     Manual Therapy   Manual Therapy Soft tissue mobilization   Soft tissue mobilization scar massage to incision                  PT Short Term Goals - 07/27/17 1045      PT SHORT TERM GOAL #1   Title patient to be independent with initial HEP   Status On-going     PT SHORT TERM GOAL #2   Title patient to improve R knee PROM to 0-100   Status On-going           PT Long Term Goals - 07/27/17 1048      PT LONG TERM GOAL #1   Title patient to be independent with advanced HEP    Status On-going     PT LONG TERM GOAL #2   Title patient to demonstrate R active SLR with no quad lag   Status On-going     PT LONG TERM GOAL #3   Title Patient to improve R knee AROM to 0-100   Status On-going     PT LONG TERM GOAL #4   Title Patient to demonstrate good heel toe gait with LRAD over various surfaces demonstrating improved functional mobility   Status On-going               Plan - 07/27/17 1048    Clinical Impression Statement Gloria Patterson doing well today with all strengthening activities. KNee flexion continues to be greatest limiting factor with inability to have a reciprocal pattern on recumbent bike as well as much difficulty with heel slides. Will continue to progress towards goals.    PT Treatment/Interventions ADLs/Self Care Home Management;Cryotherapy;Electrical Stimulation;Iontophoresis 4mg /ml Dexamethasone;Moist Heat;Ultrasound;Neuromuscular re-education;Balance training;Therapeutic exercise;Therapeutic activities;Functional mobility training;Stair training;Gait training;Patient/family education;Manual techniques;Passive range of motion;Vasopneumatic Device;Taping;Dry needling   Consulted and Agree with Plan of Care Patient      Patient will benefit from skilled therapeutic intervention in order to improve the following deficits and impairments:  Abnormal gait, Decreased activity tolerance, Decreased balance,  Decreased range of motion, Decreased mobility, Decreased strength, Difficulty walking, Pain  Visit Diagnosis: Acute pain of right knee  Stiffness of right knee, not elsewhere classified  Difficulty in walking, not elsewhere classified  Other abnormalities of gait and mobility  Left knee pain, unspecified chronicity     Problem List Patient Active Problem List   Diagnosis Date Noted  . HCAP (healthcare-associated pneumonia) 06/03/2017  . Sepsis (HCC) 06/03/2017  . S/P TKR (total knee replacement), right 06/03/2017  .  Type 2 diabetes mellitus without complication (HCC) 06/03/2017  . Arthritis of knee 05/31/2017  . Essential hypertension 08/25/2013  . GERD (gastroesophageal reflux disease) 05/23/2013  . Narcotic withdrawal (HCC) 05/23/2013  . Acute bronchitis 05/23/2013  . Osteoarthritis of left knee 04/08/2013    Class: Diagnosis of  . Upper airway cough syndrome 10/16/2012     Kipp LaurenceStephanie R Aaron, PT, DPT 07/27/17 11:23 AM   [facility] [address]  [address] [place], [place], [address] Phone: [phone]   Fax:  [phone]  Name: Gloria Patterson MRN: [id number] Date of Birth: 1951-11-18

## 2017-07-31 ENCOUNTER — Ambulatory Visit: Payer: Medicare Other | Admitting: Physical Therapy

## 2017-08-03 ENCOUNTER — Ambulatory Visit: Payer: Medicare Other | Admitting: Physical Therapy

## 2017-08-03 DIAGNOSIS — R262 Difficulty in walking, not elsewhere classified: Secondary | ICD-10-CM

## 2017-08-03 DIAGNOSIS — M25561 Pain in right knee: Secondary | ICD-10-CM | POA: Diagnosis not present

## 2017-08-03 DIAGNOSIS — R2689 Other abnormalities of gait and mobility: Secondary | ICD-10-CM

## 2017-08-03 DIAGNOSIS — M25562 Pain in left knee: Secondary | ICD-10-CM

## 2017-08-03 DIAGNOSIS — M25661 Stiffness of right knee, not elsewhere classified: Secondary | ICD-10-CM

## 2017-08-03 NOTE — Patient Instructions (Signed)
Terminal Knee Extension (Standing)    Facing anchor with right knee slightly bent and tubing just above knee, gently pull knee back straight. Do not overextend knee. Repeat __15__ times per set.  Do __2__ sessions per day.

## 2017-08-03 NOTE — Therapy (Signed)
[facility] [address]  [address] [place], [place], [address] Phone: [phone]   Fax:  [phone]  Physical Therapy Treatment  Patient Details  Name: Gloria Patterson MRN: [id number] Date of Birth: June 27, 1951 Referring Provider: Dr. Cammy Copa  Encounter Date: 08/03/2017      PT End of Session - 08/03/17 1009    Visit Number 3   Number of Visits 12   Date for PT Re-Evaluation 09/03/17   Authorization Type Medicare/Medicaid   PT Start Time 1006   PT Stop Time 1104   PT Time Calculation (min) 58 min   Activity Tolerance Patient tolerated treatment well   Behavior During Therapy [facility] for tasks assessed/performed      Past Medical History:  Diagnosis Date  . Arthritis    knee, shouder  . Asthma    MILD  . Chronic cough   . Constipation   . Diabetes mellitus without complication (HCC)    Type II  . Frequency of urination   . GERD (gastroesophageal reflux disease)   . Hepatitis    Hepatitis C treated   . 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 History of kidney stones    passed  . Hypertension   . Nocturia   . 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  Patient not sure why she was given Phenobarital,   " before 1978"  . Urgency of urination   . Vaginal vault prolapse    ANTERIOR    Past Surgical History:  Procedure Laterality Date  . ANTERIOR AND POSTERIOR REPAIR N/A 03/10/2013   Procedure: ANTERIOR (CYSTOCELE) AND POSTERIOR REPAIR (RECTOCELE);  Surgeon: Kathi Ludwig, MD;  Location: [facility];  Service: Urology;  Laterality: N/A;   [employer] UPHOLD LITE ANTERIOR VAULT REPAIR  POSSIBLE OP WITH OBSERVATION POSSIBLE FLOOR BED  Uphold LITE w/Capio SLIM Catalog number [id number] qty 2 Xenform soft tissue repair  matrix 6x7 cm [id number]   . BREAST REDUCTION SURGERY    . BREAST SURGERY     B/L removal of benign cysts  . COLONOSCOPY W/ POLYPECTOMY    . CYSTOCELE REPAIR N/A 03/10/2013   Procedure:  Anterior vaginal vault prolapse repair, with [employer] uphold light sacrospinous fixation using U. device and mesh repair with [name] plication   ;  Surgeon: Kathi Ludwig, MD;  Location: [facility];  Service: Urology;  Laterality: N/A;  . ESOPHAGOGASTRODUODENOSCOPY    . TOTAL KNEE ARTHROPLASTY Left 04/08/2013   Procedure: TOTAL KNEE ARTHROPLASTY;  Surgeon: Cammy Copa, MD;  Location: [facility] OR;  Service: Orthopedics;  Laterality: Left;  Left total knee arthroplasty  . TOTAL KNEE ARTHROPLASTY Right 05/31/2017   Procedure: RIGHT TOTAL KNEE ARTHROPLASTY;  Surgeon: Cammy Copa, MD;  Location: [facility] OR;  Service: Orthopedics;  Laterality: Right;  . TUBAL LIGATION      There were no vitals filed for this visit.      Subjective Assessment - 08/03/17 1009    Subjective patient reporitng increase in knee pain around 2am - had to up her pain meds.    Pertinent History L TKA, asthma, HTN, DM   Patient Stated Goals improve function of R knee; "I want it to bend more."  Currently in Pain? Yes   Pain Score 6    Pain Location Knee   Pain Orientation Right   Pain Descriptors / Indicators Aching;Sore;Tightness   Pain Type Surgical pain                         [facility] Adult PT Treatment/Exercise - 08/03/17 1010      Knee/Hip Exercises: Aerobic   Recumbent Bike no resistance for ROM - partial revolutions     Knee/Hip Exercises: Machines for Strengthening   Cybex Knee Extension 10# B LE x 15; 10# B con/R ecc x 10   Cybex Knee Flexion 10# B LE x 15     Knee/Hip Exercises: Standing   Terminal Knee Extension Limitations blue tband - R LE x 15 reps - 5 sec hold     Knee/Hip Exercises: Seated   Other Seated Knee/Hip Exercises Fitter - 1blue/1black - R LE x 15  reps     Knee/Hip Exercises: Supine   Straight Leg Raises Right;15 reps   Straight Leg Raises Limitations 1#   Straight Leg Raise with External Rotation Right;15 reps   Straight Leg Raise with External Rotation Limitations 1#     Modalities   Modalities Vasopneumatic     Vasopneumatic   Number Minutes Vasopneumatic  10 minutes   Vasopnuematic Location  Knee   Vasopneumatic Pressure Low   Vasopneumatic Temperature  coldest temp     Manual Therapy   Manual Therapy Joint mobilization;Soft tissue mobilization   Joint Mobilization patellar mobs - all directions - improved mobility   Soft tissue mobilization scar massage to incision                  PT Short Term Goals - 07/27/17 1045      PT SHORT TERM GOAL #1   Title patient to be independent with initial HEP   Status On-going     PT SHORT TERM GOAL #2   Title patient to improve R knee PROM to 0-100   Status On-going           PT Long Term Goals - 07/27/17 1048      PT LONG TERM GOAL #1   Title patient to be independent with advanced HEP    Status On-going     PT LONG TERM GOAL #2   Title patient to demonstrate R active SLR with no quad lag   Status On-going     PT LONG TERM GOAL #3   Title Patient to improve R knee AROM to 0-100   Status On-going     PT LONG TERM GOAL #4   Title Patient to demonstrate good heel toe gait with LRAD over various surfaces demonstrating improved functional mobility   Status On-going               Plan - 08/03/17 1010    Clinical Impression Statement Patient today with reports of pain increase through the night, disrupting her sleep - is trying to increase pain medication through clinic. DOing well today with all strengthening tasks with good progressions in resistance. HEP update with good carryover. Will continue to progress towards goals.    PT Treatment/Interventions ADLs/Self Care Home Management;Cryotherapy;Electrical Stimulation;Iontophoresis 4mg /ml  Dexamethasone;Moist Heat;Ultrasound;Neuromuscular re-education;Balance training;Therapeutic exercise;Therapeutic activities;Functional mobility training;Stair training;Gait training;Patient/family education;Manual techniques;Passive range of motion;Vasopneumatic Device;Taping;Dry needling   Consulted and Agree with Plan of Care Patient      Patient will benefit from skilled therapeutic intervention in order  to improve the following deficits and impairments:  Abnormal gait, Decreased activity tolerance, Decreased balance, Decreased range of motion, Decreased mobility, Decreased strength, Difficulty walking, Pain  Visit Diagnosis: Acute pain of right knee  Stiffness of right knee, not elsewhere classified  Difficulty in walking, not elsewhere classified  Other abnormalities of gait and mobility  Left knee pain, unspecified chronicity     Problem List Patient Active Problem List   Diagnosis Date Noted  . HCAP (healthcare-associated pneumonia) 06/03/2017  . Sepsis (HCC) 06/03/2017  . S/P TKR (total knee replacement), right 06/03/2017  . Type 2 diabetes mellitus without complication (HCC) 06/03/2017  . Arthritis of knee 05/31/2017  . Essential hypertension 08/25/2013  . GERD (gastroesophageal reflux disease) 05/23/2013  . Narcotic withdrawal (HCC) 05/23/2013  . Acute bronchitis 05/23/2013  . Osteoarthritis of left knee 04/08/2013    Class: Diagnosis of  . Upper airway cough syndrome 10/16/2012     Kipp Laurence, PT, DPT 08/03/17 11:04 AM   [facility] [address]  [address] [place], [place], [address] Phone: [phone]   Fax:  [phone]  Name: DANAYSHA KIRN MRN: [id number] Date of Birth: [birth year]

## 2017-08-07 ENCOUNTER — Ambulatory Visit: Payer: Medicare Other

## 2017-08-07 DIAGNOSIS — R262 Difficulty in walking, not elsewhere classified: Secondary | ICD-10-CM

## 2017-08-07 DIAGNOSIS — M25561 Pain in right knee: Secondary | ICD-10-CM | POA: Diagnosis not present

## 2017-08-07 DIAGNOSIS — M25661 Stiffness of right knee, not elsewhere classified: Secondary | ICD-10-CM

## 2017-08-07 DIAGNOSIS — R2689 Other abnormalities of gait and mobility: Secondary | ICD-10-CM

## 2017-08-07 DIAGNOSIS — M25562 Pain in left knee: Secondary | ICD-10-CM

## 2017-08-07 NOTE — Therapy (Signed)
[facility] [address]  [address] [place], [place], [address] Phone: [phone]   Fax:  [phone]  Physical Therapy Treatment  Patient Details  Name: Gloria Patterson MRN: [id number] Date of Birth: 11-30-1951 Referring Provider: Dr. Cammy Copa  Encounter Date: 08/07/2017      PT End of Session - 08/07/17 1023    Visit Number 4   Number of Visits 12   Date for PT Re-Evaluation 09/03/17   Authorization Type Medicare/Medicaid   PT Start Time 1015   PT Stop Time 1115   PT Time Calculation (min) 60 min   Activity Tolerance Patient tolerated treatment well   Behavior During Therapy [facility] for tasks assessed/performed      Past Medical History:  Diagnosis Date  . Arthritis    knee, shouder  . Asthma    MILD  . Chronic cough   . Constipation   . Diabetes mellitus without complication (HCC)    Type II  . Frequency of urination   . GERD (gastroesophageal reflux disease)   . Hepatitis    Hepatitis C treated   . 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 History of kidney stones    passed  . Hypertension   . Nocturia   . 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  Patient not sure why she was given Phenobarital,   " before 1978"  . Urgency of urination   . Vaginal vault prolapse    ANTERIOR    Past Surgical History:  Procedure Laterality Date  . ANTERIOR AND POSTERIOR REPAIR N/A 03/10/2013   Procedure: ANTERIOR (CYSTOCELE) AND POSTERIOR REPAIR (RECTOCELE);  Surgeon: Kathi Ludwig, MD;  Location: [facility];  Service: Urology;  Laterality: N/A;   [employer] UPHOLD LITE ANTERIOR VAULT REPAIR  POSSIBLE OP WITH OBSERVATION POSSIBLE FLOOR BED  Uphold LITE w/[employer] SLIM Catalog number [id number] qty 2 Xenform soft tissue repair  matrix 6x7 cm [id number]   . BREAST REDUCTION SURGERY    . BREAST SURGERY     B/L removal of benign cysts  . COLONOSCOPY W/ POLYPECTOMY    . CYSTOCELE REPAIR N/A 03/10/2013   Procedure:  Anterior vaginal vault prolapse repair, with [employer] uphold light sacrospinous fixation using U. device and mesh repair with [name] plication   ;  Surgeon: Kathi Ludwig, MD;  Location: [facility];  Service: Urology;  Laterality: N/A;  . ESOPHAGOGASTRODUODENOSCOPY    . TOTAL KNEE ARTHROPLASTY Left 04/08/2013   Procedure: TOTAL KNEE ARTHROPLASTY;  Surgeon: Cammy Copa, MD;  Location: [facility] OR;  Service: Orthopedics;  Laterality: Left;  Left total knee arthroplasty  . TOTAL KNEE ARTHROPLASTY Right 05/31/2017   Procedure: RIGHT TOTAL KNEE ARTHROPLASTY;  Surgeon: Cammy Copa, MD;  Location: [facility] OR;  Service: Orthopedics;  Laterality: Right;  . TUBAL LIGATION      There were no vitals filed for this visit.      Subjective Assessment - 08/07/17 1015    Subjective Pt. still waking with pain in night.     Patient Stated Goals improve function of R knee; "I want it to bend more."   Currently in Pain? No/denies   Pain Score 0-No pain   Multiple Pain Sites  No            [facility] PT Assessment - 08/07/17 1047      AROM   AROM Assessment Site Knee   Right/Left Knee Right;Left   Right Knee Extension 4  heel prop supine    Right Knee Flexion 84     PROM   PROM Assessment Site Knee   Right/Left Knee Right   Right Knee Extension 2   Right Knee Flexion 90                     [facility] Adult PT Treatment/Exercise - 08/07/17 1022      Knee/Hip Exercises: Standing   Functional Squat 15 reps;3 seconds   Functional Squat Limitations at counter; tc for even wt. shift    Other Standing Knee Exercises L knee lunge stretch on 8" step 5" x 5 reps; 2 UE support      Knee/Hip Exercises: Seated   Hamstring Curl Right;15 reps   Hamstring Limitations red tband      Knee/Hip Exercises: Supine   Heel Slides AAROM;Right;10 reps   Heel Slides Limitations pillowcase and strap assist    Straight Leg Raises Right;15 reps   Straight Leg Raises Limitations 2#    Straight Leg Raise with External Rotation Right;15 reps   Straight Leg Raise with External Rotation Limitations 2#   Other Supine Knee/Hip Exercises Straight leg bridge with heels on peanut p-ball 3" x 15 reps     Vasopneumatic   Number Minutes Vasopneumatic  15 minutes   Vasopnuematic Location  Knee   Vasopneumatic Pressure Low   Vasopneumatic Temperature  coldest temp     Manual Therapy   Manual Therapy Joint mobilization;Soft tissue mobilization   Joint Mobilization patellar mobs - all directions                     PT Short Term Goals - 07/27/17 1045      PT SHORT TERM GOAL #1   Title patient to be independent with initial HEP   Status On-going     PT SHORT TERM GOAL #2   Title patient to improve R knee PROM to 0-100   Status On-going           PT Long Term Goals - 07/27/17 1048      PT LONG TERM GOAL #1   Title patient to be independent with advanced HEP    Status On-going     PT LONG TERM GOAL #2   Title patient to demonstrate R active SLR with no quad lag   Status On-going     PT LONG TERM GOAL #3   Title Patient to improve R knee AROM to 0-100   Status On-going     PT LONG TERM GOAL #4   Title Patient to demonstrate good heel toe gait with LRAD over various surfaces demonstrating improved functional mobility   Status On-going               Plan - 08/07/17 1030    Clinical Impression Statement Jolaine doing well with all strengthening and ROM activities in treatment.  Improved R knee AROM to 4-84 dg, ad PROM to 2-90 dg today.  Discussion with pt. today regarding need for continued focus in flexion ROM exercises at home.  Pt. verbalized understanding of this and reports she is performing scar massage daily.  treatment ending with ice/compression  to L knee to decrease post-exercise swelling and pain.  PT Treatment/Interventions ADLs/Self Care Home Management;Cryotherapy;Electrical Stimulation;Iontophoresis 4mg /ml Dexamethasone;Moist Heat;Ultrasound;Neuromuscular re-education;Balance training;Therapeutic exercise;Therapeutic activities;Functional mobility training;Stair training;Gait training;Patient/family education;Manual techniques;Passive range of motion;Vasopneumatic Device;Taping;Dry needling      Patient will benefit from skilled therapeutic intervention in order to improve the following deficits and impairments:  Abnormal gait, Decreased activity tolerance, Decreased balance, Decreased range of motion, Decreased mobility, Decreased strength, Difficulty walking, Pain  Visit Diagnosis: Acute pain of right knee  Stiffness of right knee, not elsewhere classified  Difficulty in walking, not elsewhere classified  Other abnormalities of gait and mobility  Left knee pain, unspecified chronicity     Problem List Patient Active Problem List   Diagnosis Date Noted  . HCAP (healthcare-associated pneumonia) 06/03/2017  . Sepsis (HCC) 06/03/2017  . S/P TKR (total knee replacement), right 06/03/2017  . Type 2 diabetes mellitus without complication (HCC) 06/03/2017  . Arthritis of knee 05/31/2017  . Essential hypertension 08/25/2013  . GERD (gastroesophageal reflux disease) 05/23/2013  . Narcotic withdrawal (HCC) 05/23/2013  . Acute bronchitis 05/23/2013  . Osteoarthritis of left knee 04/08/2013    Class: Diagnosis of  . Upper airway cough syndrome 10/16/2012    Kermit Balo, PTA 08/07/17 12:08 PM  [facility] [address]  [address] [place], [place], [address] Phone: [phone]   Fax:  [phone]  Name: UBAH RADKE MRN: [id number] Date of Birth: 1951-07-08

## 2017-08-10 ENCOUNTER — Ambulatory Visit: Payer: Medicare Other | Admitting: Physical Therapy

## 2017-08-10 DIAGNOSIS — M25561 Pain in right knee: Secondary | ICD-10-CM

## 2017-08-10 DIAGNOSIS — M25562 Pain in left knee: Secondary | ICD-10-CM

## 2017-08-10 DIAGNOSIS — M25661 Stiffness of right knee, not elsewhere classified: Secondary | ICD-10-CM

## 2017-08-10 DIAGNOSIS — R2689 Other abnormalities of gait and mobility: Secondary | ICD-10-CM

## 2017-08-10 DIAGNOSIS — R262 Difficulty in walking, not elsewhere classified: Secondary | ICD-10-CM

## 2017-08-10 NOTE — Therapy (Signed)
[facility] Outpatient Rehabilitation [facility] [address]  [address] [place], [place], [address] Phone: [phone]   Fax:  [phone]  Physical Therapy Treatment  Patient Details  Name: Gloria Patterson MRN: [id number] Date of Birth: 1951-08-20 Referring Provider: Dr. Cammy Copa  Encounter Date: 08/10/2017      PT End of Session - 08/10/17 1024    Visit Number 5   Number of Visits 12   Date for PT Re-Evaluation 09/03/17   Authorization Type Medicare/Medicaid   PT Start Time 1013   PT Stop Time 1056   PT Time Calculation (min) 43 min   Activity Tolerance Patient tolerated treatment well   Behavior During Therapy [facility] for tasks assessed/performed      Past Medical History:  Diagnosis Date  . Arthritis    knee, shouder  . Asthma    MILD  . Chronic cough   . Constipation   . Diabetes mellitus without complication (HCC)    Type II  . Frequency of urination   . GERD (gastroesophageal reflux disease)   . Hepatitis    Hepatitis C treated   . 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 History of kidney stones    passed  . Hypertension   . Nocturia   . 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  Patient not sure why she was given Phenobarital,   " before 1978"  . Urgency of urination   . Vaginal vault prolapse    ANTERIOR    Past Surgical History:  Procedure Laterality Date  . ANTERIOR AND POSTERIOR REPAIR N/A 03/10/2013   Procedure: ANTERIOR (CYSTOCELE) AND POSTERIOR REPAIR (RECTOCELE);  Surgeon: Kathi Ludwig, MD;  Location: [facility];  Service: Urology;  Laterality: N/A;   BO[employer] UPHOLD LITE ANTERIOR VAULT REPAIR  POSSIBLE OP WITH OBSERVATION POSSIBLE FLOOR BED  Uphold LITE w/Capio SLIM Catalog number [id number] qty 2 Xenform soft tissue repair  matrix 6x7 cm [id number]   . BREAST REDUCTION SURGERY    . BREAST SURGERY     B/L removal of benign cysts  . COLONOSCOPY W/ POLYPECTOMY    . CYSTOCELE REPAIR N/A 03/10/2013   Procedure:  Anterior vaginal vault prolapse repair, with [employer] uphold light sacrospinous fixation using U. device and mesh repair with [name] plication   ;  Surgeon: Kathi Ludwig, MD;  Location: [facility];  Service: Urology;  Laterality: N/A;  . ESOPHAGOGASTRODUODENOSCOPY    . TOTAL KNEE ARTHROPLASTY Left 04/08/2013   Procedure: TOTAL KNEE ARTHROPLASTY;  Surgeon: Cammy Copa, MD;  Location: [facility] OR;  Service: Orthopedics;  Laterality: Left;  Left total knee arthroplasty  . TOTAL KNEE ARTHROPLASTY Right 05/31/2017   Procedure: RIGHT TOTAL KNEE ARTHROPLASTY;  Surgeon: Cammy Copa, MD;  Location: [facility] OR;  Service: Orthopedics;  Laterality: Right;  . TUBAL LIGATION      There were no vitals filed for this visit.      Subjective Assessment - 08/10/17 1023    Subjective Describes a burning sensation at knee rather than pain   Patient Stated Goals improve function of R knee; "I want it to bend more."   Currently in Pain? Yes   Pain Score 2    Pain Location Knee  Pain Orientation Right   Pain Descriptors / Indicators Burning   Pain Type Surgical pain            [facility] PT Assessment - 08/10/17 0001      PROM   PROM Assessment Site Knee   Right/Left Knee Right   Right Knee Extension 0   Right Knee Flexion 93                     [facility] Adult PT Treatment/Exercise - 08/10/17 1024      Knee/Hip Exercises: Stretches   Lobbyist Right;3 reps;60 seconds   Quad Stretch Limitations prone with strap     Knee/Hip Exercises: Aerobic   Recumbent Bike no resistance for ROM - partial revolutions     Knee/Hip Exercises: Machines for Strengthening   Cybex Knee Extension 20# B LE x 15   Cybex Knee Flexion 20# B LE x 15     Knee/Hip Exercises: Standing    Hip Flexion Right;Knee straight   Hip Flexion Limitations 12 reps - red tband   Hip ADduction Right   Hip ADduction Limitations 12 reps - red tband   Hip Abduction Right;Knee straight   Abduction Limitations 12 reps - red tband   Hip Extension Right;Knee straight   Extension Limitations 12 reps - red tband   Functional Squat 15 reps   Functional Squat Limitations TRX   SLS R SLS on foam - 1 UE support - 3 x 30 sec   Other Standing Knee Exercises fwd/lateral toe clears to 9" stool x 15 each - 3# at ankle                  PT Short Term Goals - 07/27/17 1045      PT SHORT TERM GOAL #1   Title patient to be independent with initial HEP   Status On-going     PT SHORT TERM GOAL #2   Title patient to improve R knee PROM to 0-100   Status On-going           PT Long Term Goals - 07/27/17 1048      PT LONG TERM GOAL #1   Title patient to be independent with advanced HEP    Status On-going     PT LONG TERM GOAL #2   Title patient to demonstrate R active SLR with no quad lag   Status On-going     PT LONG TERM GOAL #3   Title Patient to improve R knee AROM to 0-100   Status On-going     PT LONG TERM GOAL #4   Title Patient to demonstrate good heel toe gait with LRAD over various surfaces demonstrating improved functional mobility   Status On-going               Plan - 08/10/17 1024    Clinical Impression Statement Mckaley doing well with all strengthening and stretching. Now past 90 degress active flexion after prone quad stretch, as well as 0 degrees knee extension. Patient with near normal giat mechanics without AD - slight deficits likely due to reduced end range quad strength. Patient to continue to beneift from PT to address functional mobility.    PT Treatment/Interventions ADLs/Self Care Home Management;Cryotherapy;Electrical Stimulation;Iontophoresis 4mg /ml Dexamethasone;Moist Heat;Ultrasound;Neuromuscular re-education;Balance training;Therapeutic  exercise;Therapeutic activities;Functional mobility training;Stair training;Gait training;Patient/family education;Manual techniques;Passive range of motion;Vasopneumatic Device;Taping;Dry needling   Consulted and Agree with Plan of Care Patient      Patient will benefit from skilled therapeutic  intervention in order to improve the following deficits and impairments:  Abnormal gait, Decreased activity tolerance, Decreased balance, Decreased range of motion, Decreased mobility, Decreased strength, Difficulty walking, Pain  Visit Diagnosis: Acute pain of right knee  Stiffness of right knee, not elsewhere classified  Difficulty in walking, not elsewhere classified  Other abnormalities of gait and mobility  Left knee pain, unspecified chronicity     Problem List Patient Active Problem List   Diagnosis Date Noted  . HCAP (healthcare-associated pneumonia) 06/03/2017  . Sepsis (HCC) 06/03/2017  . S/P TKR (total knee replacement), right 06/03/2017  . Type 2 diabetes mellitus without complication (HCC) 06/03/2017  . Arthritis of knee 05/31/2017  . Essential hypertension 08/25/2013  . GERD (gastroesophageal reflux disease) 05/23/2013  . Narcotic withdrawal (HCC) 05/23/2013  . Acute bronchitis 05/23/2013  . Osteoarthritis of left knee 04/08/2013    Class: Diagnosis of  . Upper airway cough syndrome 10/16/2012     Kipp Laurence, PT, DPT 08/10/17 11:04 AM   [facility] [address]  [address] [place], [place], [address] Phone: [phone]   Fax:  [phone]  Name: JAMYIAH LABELLA MRN: [id number] Date of Birth: [birth year]

## 2017-08-14 ENCOUNTER — Ambulatory Visit: Payer: Medicare Other | Admitting: Physical Therapy

## 2017-08-17 ENCOUNTER — Ambulatory Visit: Payer: Medicare Other | Admitting: Physical Therapy

## 2017-08-17 DIAGNOSIS — M25561 Pain in right knee: Secondary | ICD-10-CM

## 2017-08-17 DIAGNOSIS — R2689 Other abnormalities of gait and mobility: Secondary | ICD-10-CM

## 2017-08-17 DIAGNOSIS — R262 Difficulty in walking, not elsewhere classified: Secondary | ICD-10-CM

## 2017-08-17 DIAGNOSIS — M25661 Stiffness of right knee, not elsewhere classified: Secondary | ICD-10-CM

## 2017-08-17 DIAGNOSIS — M25562 Pain in left knee: Secondary | ICD-10-CM

## 2017-08-17 NOTE — Therapy (Signed)
[facility] [place] [address]  [address] [place], [place], [address] Phone: [phone]   Fax:  [phone]  Physical Therapy Treatment  Patient Details  Name: Gloria Patterson MRN: [id number] Date of Birth: 10/03/1951 Referring Provider: Dr. Cammy CopaGregory Scott Dean  Encounter Date: 08/17/2017      PT End of Session - 08/17/17 1004    Visit Number 6   Number of Visits 12   Date for PT Re-Evaluation 09/03/17   Authorization Type Medicare/Medicaid   PT Start Time 1003   PT Stop Time 1059   PT Time Calculation (min) 56 min   Activity Tolerance Patient tolerated treatment well   Behavior [facility] for tasks assessed/performed      Past Medical History:  Diagnosis Date  . Arthritis    knee, shouder  . Asthma    MILD  . Chronic cough   . Constipation   . Diabetes mellitus without complication (HCC)    Type II  . Frequency of urination   . GERD (gastroesophageal reflux disease)   . Hepatitis    Hepatitis C treated   . 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 History of kidney stones    passed  . Hypertension   . Nocturia   . 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  Patient not sure why she was given Phenobarital,   " before 1978"  . Urgency of urination   . Vaginal vault prolapse    ANTERIOR    Past Surgical History:  Procedure Laterality Date  . ANTERIOR AND POSTERIOR REPAIR N/A 03/10/2013   Procedure: ANTERIOR (CYSTOCELE) AND POSTERIOR REPAIR (RECTOCELE);  Surgeon: Kathi LudwigSigmund I Tannenbaum, MD;  [facility];  Service: Urology;  Laterality: N/A;   [employer] UPHOLD LITE ANTERIOR VAULT REPAIR  POSSIBLE OP WITH OBSERVATION POSSIBLE FLOOR BED  Uphold LITE w/Capio SLIM Catalog number [id number] qty 2 Xenform soft tissue repair  matrix 6x7 cm [id number]   . BREAST REDUCTION SURGERY    . BREAST SURGERY     B/L removal of benign cysts  . COLONOSCOPY W/ POLYPECTOMY    . CYSTOCELE REPAIR N/A 03/10/2013   Procedure:  Anterior vaginal vault prolapse repair, with [employer] uphold light sacrospinous fixation using U. device and mesh repair with Tresa EndoKelly plication   ;  Surgeon: Kathi LudwigSigmund I Tannenbaum, MD;  [facility];  Service: Urology;  Laterality: N/A;  . ESOPHAGOGASTRODUODENOSCOPY    . TOTAL KNEE ARTHROPLASTY Left 04/08/2013   Procedure: TOTAL KNEE ARTHROPLASTY;  Surgeon: Cammy CopaGregory Scott Dean, MD;  [facility];  Service: Orthopedics;  Laterality: Left;  Left total knee arthroplasty  . TOTAL KNEE ARTHROPLASTY Right 05/31/2017   Procedure: RIGHT TOTAL KNEE ARTHROPLASTY;  Surgeon: Cammy Copaean, Scott Gregory, MD;  [facility];  Service: Orthopedics;  Laterality: Right;  . TUBAL LIGATION      There were no vitals filed for this visit.      Subjective Assessment - 08/17/17 1006    Subjective says she had to up her pain medication   Pertinent History L TKA, asthma, HTN, DM   Patient Stated Goals improve function of R knee; "I want it to bend more."   Currently in Pain? Yes   Pain  Score 3    Pain Location Knee   Pain Orientation Right   Pain Descriptors / Indicators Aching   Pain Type Surgical pain                         [facility] Adult PT Treatment/Exercise - 08/17/17 0001      Knee/Hip Exercises: Aerobic   Recumbent Bike 7 min - progress to full revolutions     Knee/Hip Exercises: Machines for Strengthening   Cybex Knee Extension 25# B LE 2 x 10   Cybex Knee Flexion 25# B LE 2 x 10   Cybex Leg Press 20# B LE x 15     Knee/Hip Exercises: Standing   Lateral Step Up Right;15 reps;Hand Hold: 2;Step Height: 8"   Lateral Step Up Limitations 4# at ankle   Forward Step Up Right;15 reps;Hand Hold: 2;Step Height: 8"   Forward Step Up Limitations 4# at ankle   Wall Squat 15  reps;3 seconds     Knee/Hip Exercises: Supine   Straight Leg Raises Right;15 reps   Straight Leg Raises Limitations 3#   Straight Leg Raise with External Rotation Right;15 reps   Straight Leg Raise with External Rotation Limitations 3#     Knee/Hip Exercises: Sidelying   Hip ABduction Strengthening;Right;15 reps   Hip ABduction Limitations 3#   Hip ADduction Strengthening;Right;15 reps   Hip ADduction Limitations 3#     Modalities   Modalities Vasopneumatic     Vasopneumatic   Number Minutes Vasopneumatic  15 minutes   Vasopnuematic Location  Knee   Vasopneumatic Pressure Low   Vasopneumatic Temperature  coldest temp                  PT Short Term Goals - 08/17/17 1030      PT SHORT TERM GOAL #1   Title patient to be independent with initial HEP   Status Achieved     PT SHORT TERM GOAL #2   Title patient to improve R knee PROM to 0-100           PT Long Term Goals - 07/27/17 1048      PT LONG TERM GOAL #1   Title patient to be independent with advanced HEP    Status On-going     PT LONG TERM GOAL #2   Title patient to demonstrate R active SLR with no quad lag   Status On-going     PT LONG TERM GOAL #3   Title Patient to improve R knee AROM to 0-100   Status On-going     PT LONG TERM GOAL #4   Title Patient to demonstrate good heel toe gait with LRAD over various surfaces demonstrating improved functional mobility   Status On-going               Plan - 08/17/17 1030    Clinical Impression Statement Gloria Patterson continues to do well with all strengthening activities with good progression of resistance. Making goo progress with ROM as well as strength adn will likely continue to progress. Will plan to begin heavuly focusing on functional based activities that will carryover into the home and community environment.    PT Treatment/Interventions ADLs/Self Care Home Management;Cryotherapy;Electrical Stimulation;Iontophoresis 4mg /ml  Dexamethasone;Moist Heat;Ultrasound;Neuromuscular re-education;Balance training;Therapeutic exercise;Therapeutic activities;Functional mobility training;Stair training;Gait training;Patient/family education;Manual techniques;Passive range of motion;Vasopneumatic Device;Taping;Dry needling   Consulted and Agree with Plan of Care Patient      Patient will benefit from skilled therapeutic intervention in  order to improve the following deficits and impairments:  Abnormal gait, Decreased activity tolerance, Decreased balance, Decreased range of motion, Decreased mobility, Decreased strength, Difficulty walking, Pain  Visit Diagnosis: Acute pain of right knee  Stiffness of right knee, not elsewhere classified  Difficulty in walking, not elsewhere classified  Other abnormalities of gait and mobility  Left knee pain, unspecified chronicity     Problem List Patient Active Problem List   Diagnosis Date Noted  . HCAP (healthcare-associated pneumonia) 06/03/2017  . Sepsis (HCC) 06/03/2017  . S/P TKR (total knee replacement), right 06/03/2017  . Type 2 diabetes mellitus without complication (HCC) 06/03/2017  . Arthritis of knee 05/31/2017  . Essential hypertension 08/25/2013  . GERD (gastroesophageal reflux disease) 05/23/2013  . Narcotic withdrawal (HCC) 05/23/2013  . Acute bronchitis 05/23/2013  . Osteoarthritis of left knee 04/08/2013    Class: Diagnosis of  . Upper airway cough syndrome 10/16/2012     Kipp Laurence, PT, DPT 08/17/17 10:59 AM   [facility] [address]  [address] [place], [place], [address] Phone: [phone]   Fax:  [phone]  Name: Gloria Patterson MRN: [id number] Date of Birth: 1951/08/13

## 2017-08-23 ENCOUNTER — Ambulatory Visit: Payer: Medicare Other | Attending: Orthopedic Surgery

## 2017-08-23 DIAGNOSIS — M25562 Pain in left knee: Secondary | ICD-10-CM | POA: Diagnosis present

## 2017-08-23 DIAGNOSIS — M25561 Pain in right knee: Secondary | ICD-10-CM | POA: Insufficient documentation

## 2017-08-23 DIAGNOSIS — R2689 Other abnormalities of gait and mobility: Secondary | ICD-10-CM | POA: Diagnosis present

## 2017-08-23 DIAGNOSIS — M25661 Stiffness of right knee, not elsewhere classified: Secondary | ICD-10-CM | POA: Diagnosis present

## 2017-08-23 DIAGNOSIS — R262 Difficulty in walking, not elsewhere classified: Secondary | ICD-10-CM | POA: Insufficient documentation

## 2017-08-23 NOTE — Therapy (Signed)
[facility] Outpatient Rehabilitation [facility] [address]  [address] [place], [place], [address] Phone: [phone]   Fax:  [phone]  Physical Therapy Treatment  Patient Details  Name: Gloria Patterson MRN: [id number] Date of Birth: 10-12-1951 Referring Provider: Dr. Cammy Copa  Encounter Date: 08/23/2017      PT End of Session - 08/23/17 1024    Visit Number 7   Number of Visits 12   Date for PT Re-Evaluation 09/03/17   Authorization Type Medicare/Medicaid   PT Start Time 1016   PT Stop Time 1100   PT Time Calculation (min) 44 min   Activity Tolerance Patient tolerated treatment well   Behavior During Therapy [facility] for tasks assessed/performed      Past Medical History:  Diagnosis Date  . Arthritis    knee, shouder  . Asthma    MILD  . Chronic cough   . Constipation   . Diabetes mellitus without complication (HCC)    Type II  . Frequency of urination   . GERD (gastroesophageal reflux disease)   . Hepatitis    Hepatitis C treated   . 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 History of kidney stones    passed  . Hypertension   . Nocturia   . 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  Patient not sure why she was given Phenobarital,   " before 1978"  . Urgency of urination   . Vaginal vault prolapse    ANTERIOR    Past Surgical History:  Procedure Laterality Date  . ANTERIOR AND POSTERIOR REPAIR N/A 03/10/2013   Procedure: ANTERIOR (CYSTOCELE) AND POSTERIOR REPAIR (RECTOCELE);  Surgeon: Kathi Ludwig, MD;  [facility];  Service: Urology;  Laterality: N/A;   [employer] UPHOLD LITE ANTERIOR VAULT REPAIR  POSSIBLE OP WITH OBSERVATION POSSIBLE FLOOR BED  Uphold LITE w/[employer] SLIM Catalog number [id number] qty 2 Xenform soft tissue repair  matrix 6x7 cm [id number]   . BREAST REDUCTION SURGERY    . BREAST SURGERY     B/L removal of benign cysts  . COLONOSCOPY W/ POLYPECTOMY    . CYSTOCELE REPAIR N/A 03/10/2013   Procedure:  Anterior vaginal vault prolapse repair, with [employer] uphold light sacrospinous fixation using U. device and mesh repair with [name] plication   ;  Surgeon: Kathi Ludwig, MD;  Location: [facility];  Service: Urology;  Laterality: N/A;  . ESOPHAGOGASTRODUODENOSCOPY    . TOTAL KNEE ARTHROPLASTY Left 04/08/2013   Procedure: TOTAL KNEE ARTHROPLASTY;  Surgeon: Cammy Copa, MD;  Location: [facility] OR;  Service: Orthopedics;  Laterality: Left;  Left total knee arthroplasty  . TOTAL KNEE ARTHROPLASTY Right 05/31/2017   Procedure: RIGHT TOTAL KNEE ARTHROPLASTY;  Surgeon: Cammy Copa, MD;  Location: [facility] OR;  Service: Orthopedics;  Laterality: Right;  . TUBAL LIGATION      There were no vitals filed for this visit.      Subjective Assessment - 08/23/17 1020    Subjective Pt. noting back pain today without known trigger.  Reports my back pain "hurts every time i drink soda".     Patient Stated Goals improve function of R knee; "I want it to bend more."   Currently in Pain?  Yes   Pain Score 9    Pain Location Back   Pain Orientation Right   Pain Descriptors / Indicators Constant   Pain Type Surgical pain   Pain Onset More than a month ago   Pain Frequency Constant   Aggravating Factors  "drinking soda"             [facility] PT Assessment - 08/23/17 1302      AROM   AROM Assessment Site Knee   Right/Left Knee Right   Right Knee Extension 2   Right Knee Flexion 88     PROM   PROM Assessment Site Knee   Right/Left Knee Right   Right Knee Extension 0   Right Knee Flexion 93                     [facility] Adult PT Treatment/Exercise - 08/23/17 1033      Knee/Hip Exercises: Stretches   Quad Stretch Right;60 seconds;1 rep   LobbyistQuad Stretch Limitations prone  with strap     Knee/Hip Exercises: Aerobic   Nustep Lvl 6, 6 min   nustep due to back pain     Knee/Hip Exercises: Machines for Strengthening   Cybex Leg Press 25# B LE x 15     Knee/Hip Exercises: Standing   Heel Raises Both;Right;15 reps   Heel Raises Limitations B con/R ecc    Lateral Step Up 20 reps;Right;Step Height: 8"   Forward Step Up 20 reps;Right;Step Height: 8"   Functional Squat 20 reps   Functional Squat Limitations counter      Knee/Hip Exercises: Seated   Sit to Sand 15 reps  from ~ 15" machine seated      Knee/Hip Exercises: Supine   Straight Leg Raises Right;5 reps   Straight Leg Raises Limitations still with slight lag      Manual Therapy   Manual Therapy Joint mobilization   Joint Mobilization patellar mobs - all directions; anterior/posterior mobs to improve ROPM                   PT Short Term Goals - 08/17/17 1030      PT SHORT TERM GOAL #1   Title patient to be independent with initial HEP   Status Achieved     PT SHORT TERM GOAL #2   Title patient to improve R knee PROM to 0-100           PT Long Term Goals - 08/23/17 1043      PT LONG TERM GOAL #1   Title patient to be independent with advanced HEP    Status On-going     PT LONG TERM GOAL #2   Title patient to demonstrate R active SLR with no quad lag   Status On-going  still with slight quad lag      PT LONG TERM GOAL #3   Title Patient to improve R knee AROM to 0-100   Status On-going  AROM: 2-88 dg      PT LONG TERM GOAL #4   Title Patient to demonstrate good heel toe gait with LRAD over various surfaces demonstrating improved functional mobility   Status On-going               Plan - 08/23/17 1026    Clinical Impression Statement Gloria Patterson doing well today however noting some back pain after cleaning tub at home.  Tolerated progression of functional strengthening without knee pain today.  Making good progress with  strengthening activity and ROM with  significant improvement since start of therapy.  Will continue to focus on functional strengthening for carryover to home and community.     PT Treatment/Interventions ADLs/Self Care Home Management;Cryotherapy;Electrical Stimulation;Iontophoresis /ml Dexamethasone;Moist Heat;Ultrasound;Neuromuscular re-education;Balance training;Therapeutic exercise;Therapeutic activities;Functional mobility training;Stair training;Gait training;Patient/family education;Manual techniques;Passive range of motion;Vasopneumatic Device;Taping;Dry needling      Patient will benefit from skilled therapeutic intervention in order to improve the following deficits and impairments:  Abnormal gait, Decreased activity tolerance, Decreased balance, Decreased range of motion, Decreased mobility, Decreased strength, Difficulty walking, Pain  Visit Diagnosis: Acute pain of right knee  Stiffness of right knee, not elsewhere classified  Difficulty in walking, not elsewhere classified  Other abnormalities of gait and mobility  Left knee pain, unspecified chronicity     Problem List Patient Active Problem List   Diagnosis Date Noted  . HCAP (healthcare-associated pneumonia) 06/03/2017  . Sepsis (HCC) 06/03/2017  . S/P TKR (total knee replacement), right 06/03/2017  . Type 2 diabetes mellitus without complication (HCC) 06/03/2017  . Arthritis of knee 05/31/2017  . Essential hypertension 08/25/2013  . GERD (gastroesophageal reflux disease) 05/23/2013  . Narcotic withdrawal (HCC) 05/23/2013  . Acute bronchitis 05/23/2013  . Osteoarthritis of left knee 04/08/2013    Class: Diagnosis of  . Upper airway cough syndrome 10/16/2012    Kermit Balo, PTA 08/23/17 1:11 PM  [facility] [address]  [address] [place], [place], [address] Phone: [phone]   Fax:  [phone]  Name: Gloria Patterson MRN: [id number] Date of Birth: June 11, 1951

## 2017-08-28 ENCOUNTER — Ambulatory Visit: Payer: Medicare Other

## 2017-08-28 DIAGNOSIS — M25562 Pain in left knee: Secondary | ICD-10-CM

## 2017-08-28 DIAGNOSIS — M25561 Pain in right knee: Secondary | ICD-10-CM

## 2017-08-28 DIAGNOSIS — R2689 Other abnormalities of gait and mobility: Secondary | ICD-10-CM

## 2017-08-28 DIAGNOSIS — M25661 Stiffness of right knee, not elsewhere classified: Secondary | ICD-10-CM

## 2017-08-28 DIAGNOSIS — R262 Difficulty in walking, not elsewhere classified: Secondary | ICD-10-CM

## 2017-08-28 NOTE — Therapy (Signed)
[facility] [address]  [address] [place], [place], [address] Phone: [phone]   Fax:  [phone]  Physical Therapy Treatment  Patient Details  Name: Gloria HeadsDorothy J Lashley MRN: [id number] Date of Birth: [birth year] Referring Provider: Dr. Cammy CopaGregory Scott Dean  Encounter Date: 08/28/2017      PT End of Session - 08/28/17 1025    Visit Number 8   Number of Visits 12   Date for PT Re-Evaluation 09/03/17   Authorization Type Medicare/Medicaid   PT Start Time 1017   PT Stop Time 1055   PT Time Calculation (min) 38 min   Activity Tolerance Patient tolerated treatment well   Behavior [facility] Therapy [facility] for tasks assessed/performed      Past Medical History:  Diagnosis Date  . Arthritis    knee, shouder  . Asthma    MILD  . Chronic cough   . Constipation   . Diabetes mellitus without complication (HCC)    Type II  . Frequency of urination   . GERD (gastroesophageal reflux disease)   . Hepatitis    Hepatitis C treated   . 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 History of kidney stones    passed  . Hypertension   . Nocturia   . 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  Patient not sure why she was given Phenobarital,   " before 1978"  . Urgency of urination   . Vaginal vault prolapse    ANTERIOR    Past Surgical History:  Procedure Laterality Date  . ANTERIOR AND POSTERIOR REPAIR N/A 03/10/2013   Procedure: ANTERIOR (CYSTOCELE) AND POSTERIOR REPAIR (RECTOCELE);  Surgeon: Kathi LudwigSigmund I Tannenbaum, MD;  [facility];  Service: Urology;  Laterality: N/A;   BOSTON SCI[employer]IFIC UPHOLD LITE ANTERIOR VAULT REPAIR  POSSIBLE OP WITH OBSERVATION POSSIBLE FLOOR BED  Uphold LITE w/Capio SLIM Catalog number [id number] qty 2 Xenform soft tissue repair  matrix 6x7 cm [id number]   . BREAST REDUCTION SURGERY    . BREAST SURGERY     B/L removal of benign cysts  . COLONOSCOPY W/ POLYPECTOMY    . CYSTOCELE REPAIR N/A 03/10/2013   Procedure:  Anterior vaginal vault prolapse repair, with [employer] uphold light sacrospinous fixation using U. device and mesh repair with Tresa EndoKelly plication   ;  Surgeon: Kathi LudwigSigmund I Tannenbaum, MD;  [facility];  Service: Urology;  Laterality: N/A;  . ESOPHAGOGASTRODUODENOSCOPY    . TOTAL KNEE ARTHROPLASTY Left 04/08/2013   Procedure: TOTAL KNEE ARTHROPLASTY;  Surgeon: Cammy CopaGregory Scott Dean, MD;  [facility];  Service: Orthopedics;  Laterality: Left;  Left total knee arthroplasty  . TOTAL KNEE ARTHROPLASTY Right 05/31/2017   Procedure: RIGHT TOTAL KNEE ARTHROPLASTY;  Surgeon: Cammy Copaean, Scott Gregory, MD;  [facility];  Service: Orthopedics;  Laterality: Right;  . TUBAL LIGATION      There were no vitals filed for this visit.      Subjective Assessment - 08/28/17 1023    Subjective Pt. noting knee hurting somewhat worse last few days without known trigger.     Patient Stated Goals improve function of R knee; "I want it to bend more."   Currently in Pain? Yes   Pain Score 6  Pain Location Knee   Pain Orientation Right   Pain Descriptors / Indicators Constant   Pain Type Surgical pain   Pain Onset More than a month ago   Pain Frequency Constant   Aggravating Factors  bending the knee    Multiple Pain Sites No                         [facility] Adult PT Treatment/Exercise - 08/28/17 1030      Knee/Hip Exercises: Stretches   Lobbyist Right;60 seconds;1 rep   Lobbyist Limitations prone with strap     Knee/Hip Exercises: Aerobic   Recumbent Bike 7 min - progress to full revolutions     Knee/Hip Exercises: Machines for Strengthening   Cybex Knee Extension 25# B LE x 15   Cybex Knee Flexion 25# 2 x 15 reps   Cybex Leg Press 35# B LE x 10 reps     Knee/Hip  Exercises: Standing   Forward Lunges Right;Left;10 reps;3 seconds   Forward Lunges Limitations at counter   Lateral Step Up 20 reps;Right;Step Height: 8";Hand Hold: 1   Lateral Step Up Limitations light UE support on counter    Forward Step Up 20 reps;Right;Step Height: 8";Hand Hold: 1   Forward Step Up Limitations light UE on counter    Functional Squat 20 reps   Functional Squat Limitations counter                 PT Education - 08/28/17 1057    Education provided Yes   Education Details counter squat    Person(s) Educated Patient   Methods Explanation;Demonstration;Verbal cues;Handout   Comprehension Verbalized understanding;Returned demonstration;Verbal cues required;Need further instruction          PT Short Term Goals - 08/17/17 1030      PT SHORT TERM GOAL #1   Title patient to be independent with initial HEP   Status Achieved     PT SHORT TERM GOAL #2   Title patient to improve R knee PROM to 0-100           PT Long Term Goals - 08/23/17 1043      PT LONG TERM GOAL #1   Title patient to be independent with advanced HEP    Status On-going     PT LONG TERM GOAL #2   Title patient to demonstrate R active SLR with no quad lag   Status On-going  still with slight quad lag      PT LONG TERM GOAL #3   Title Patient to improve R knee AROM to 0-100   Status On-going  AROM: 2-88 dg      PT LONG TERM GOAL #4   Title Patient to demonstrate good heel toe gait with LRAD over various surfaces demonstrating improved functional mobility   Status On-going               Plan - 08/28/17 1026    Clinical Impression Statement Pt. reporting somewhat increased R knee pain this morning without known trigger.  Tolerated mild progression in functional strengthening activities well today.  Reports performing all HEP activities at home daily.  Seems to be progressing well.     PT Treatment/Interventions ADLs/Self Care Home Management;Cryotherapy;Electrical  Stimulation;Iontophoresis /ml Dexamethasone;Moist Heat;Ultrasound;Neuromuscular re-education;Balance training;Therapeutic exercise;Therapeutic activities;Functional mobility training;Stair training;Gait training;Patient/family education;Manual techniques;Passive range of motion;Vasopneumatic Device;Taping;Dry needling      Patient will benefit from skilled therapeutic intervention in order to improve the following  deficits and impairments:  Abnormal gait, Decreased activity tolerance, Decreased balance, Decreased range of motion, Decreased mobility, Decreased strength, Difficulty walking, Pain  Visit Diagnosis: Acute pain of right knee  Stiffness of right knee, not elsewhere classified  Difficulty in walking, not elsewhere classified  Other abnormalities of gait and mobility  Left knee pain, unspecified chronicity     Problem List Patient Active Problem List   Diagnosis Date Noted  . HCAP (healthcare-associated pneumonia) 06/03/2017  . Sepsis (HCC) 06/03/2017  . S/P TKR (total knee replacement), right 06/03/2017  . Type 2 diabetes mellitus without complication (HCC) 06/03/2017  . Arthritis of knee 05/31/2017  . Essential hypertension 08/25/2013  . GERD (gastroesophageal reflux disease) 05/23/2013  . Narcotic withdrawal (HCC) 05/23/2013  . Acute bronchitis 05/23/2013  . Osteoarthritis of left knee 04/08/2013    Class: Diagnosis of  . Upper airway cough syndrome 10/16/2012    Kermit Balo, PTA 08/28/17 12:11 PM  [facility] [address]  [address] [place], [place], [address] Phone: [phone]   Fax:  [phone]  Name: KRISTINE CHAHAL MRN: [id number] Date of Birth: [birth year]

## 2017-08-31 ENCOUNTER — Ambulatory Visit: Payer: Medicare Other

## 2017-09-03 ENCOUNTER — Ambulatory Visit: Payer: Medicare Other

## 2017-09-03 DIAGNOSIS — M25561 Pain in right knee: Secondary | ICD-10-CM

## 2017-09-03 DIAGNOSIS — R2689 Other abnormalities of gait and mobility: Secondary | ICD-10-CM

## 2017-09-03 DIAGNOSIS — R262 Difficulty in walking, not elsewhere classified: Secondary | ICD-10-CM

## 2017-09-03 DIAGNOSIS — M25661 Stiffness of right knee, not elsewhere classified: Secondary | ICD-10-CM

## 2017-09-03 DIAGNOSIS — M25562 Pain in left knee: Secondary | ICD-10-CM

## 2017-09-03 NOTE — Therapy (Signed)
[place] [address]  [place], [place], [address] Phone: [phone]   Fax:  [phone]  Physical Therapy Treatment  Patient Details  Name: Gloria Patterson MRN: [id number] Date of Birth: 08/22/1951 Referring Provider: Dr. Meredith Pel  Encounter Date: 09/03/2017      PT End of Session - 09/03/17 1021    Visit Number 9   Number of Visits 12   Date for PT Re-Evaluation 09/03/17   Authorization Type Medicare/Medicaid   PT Start Time 1016   PT Stop Time 1100   PT Time Calculation (min) 44 min   Activity Tolerance Patient tolerated treatment well   Behavior During Therapy [facility] for tasks assessed/performed      Past Medical History:  Diagnosis Date  . Arthritis    knee, shouder  . Asthma    MILD  . Chronic cough   . Constipation   . Diabetes mellitus without complication ([place])    Type II  . Frequency of urination   . GERD (gastroesophageal reflux disease)   . Hepatitis    Hepatitis C treated   . 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 History of kidney stones    passed  . Hypertension   . Nocturia   . Seizures ([place])    due to Phenobarbital.  Patient not sure why she was given Phenobarital,   " before 1978"  . Urgency of urination   . Vaginal vault prolapse    ANTERIOR    Past Surgical History:  Procedure Laterality Date  . ANTERIOR AND POSTERIOR REPAIR N/A 03/10/2013   Procedure: ANTERIOR (CYSTOCELE) AND POSTERIOR REPAIR (RECTOCELE);  Surgeon: Ailene Rud, MD;  [facility];  Service: Urology;  Laterality: N/A;   [employer] UPHOLD LITE ANTERIOR VAULT REPAIR  POSSIBLE OP WITH OBSERVATION POSSIBLE FLOOR BED  Uphold LITE w/Capio SLIM Catalog number [id number] qty 2 Xenform soft tissue repair  matrix 6x7 cm [id number]   . BREAST REDUCTION SURGERY    . BREAST SURGERY     B/L removal of benign cysts  . COLONOSCOPY W/ POLYPECTOMY    . CYSTOCELE REPAIR N/A 03/10/2013   Procedure:  Anterior vaginal vault prolapse repair, with [employer] uphold light sacrospinous fixation using U. device and mesh repair with Claiborne Billings plication   ;  Surgeon: Ailene Rud, MD;  [facility];  Service: Urology;  Laterality: N/A;  . ESOPHAGOGASTRODUODENOSCOPY    . TOTAL KNEE ARTHROPLASTY Left 04/08/2013   Procedure: TOTAL KNEE ARTHROPLASTY;  Surgeon: Meredith Pel, MD;  Location: [place];  Service: Orthopedics;  Laterality: Left;  Left total knee arthroplasty  . TOTAL KNEE ARTHROPLASTY Right 05/31/2017   Procedure: RIGHT TOTAL KNEE ARTHROPLASTY;  Surgeon: Meredith Pel, MD;  Location: [place];  Service: Orthopedics;  Laterality: Right;  . TUBAL LIGATION      There were no vitals filed for this visit.      Subjective Assessment - 09/03/17 1019    Subjective Pt. doing well today.  Notes she wishes to continue with therapy to improve ability to get down to floor and perform daily chores.     Patient Stated Goals improve function of R knee; "I want it to bend more."  Currently in Pain? Yes   Pain Score 5    Pain Location Knee   Pain Orientation Right   Pain Descriptors / Indicators Constant   Pain Type Surgical pain   Pain Onset More than a month ago   Multiple Pain Sites No            [facility] PT Assessment - 09/03/17 1031      AROM   AROM Assessment Site Knee   Right/Left Knee Right   Right Knee Extension 2   Right Knee Flexion 89     PROM   PROM Assessment Site Knee   Right/Left Knee Right   Right Knee Extension 0   Right Knee Flexion 95                     [facility] Adult PT Treatment/Exercise - 09/03/17 1251      Ambulation/Gait   Ambulation/Gait Yes   Ambulation/Gait Assistance 7: Independent   Ambulation Distance (Feet) 300  Feet   Assistive device None   Gait Comments Near symmetrical gait without SPC; pt. verbalizing she "feels good" without cane and has been able to      Self-Care   Self-Care Other Self-Care Comments   Other Self-Care Comments  Discussion and demonstration of strategies to get up and down from floor for household cleaning, cleaning tub, etc     Knee/Hip Exercises: Aerobic   Recumbent Bike 7 min - full revolutions     Knee/Hip Exercises: Standing   Heel Raises Both;Right;15 reps   Heel Raises Limitations B con/R ecc    Forward Lunges Right;Left;10 reps;3 seconds   Forward Lunges Limitations at counter   Lateral Step Up 20 reps;Right;Step Height: 8";Hand Hold: 0   Lateral Step Up Limitations No UE support   Forward Step Up 20 reps;Right;Step Height: 8"   Forward Step Up Limitations no UE support    Functional Squat 20 reps   Functional Squat Limitations at UBE; cues to increase depth to simulate bending down to floor      Manual Therapy   Manual Therapy Joint mobilization;Passive ROM   Joint Mobilization Patellar mobs- all directions; anterior/posterior mobs to improve ROM    Passive ROM Gentle R ITB, glute, HS stretching with therapist x 30 sec each; due to reported tightness on lateral knee                   PT Short Term Goals - 09/03/17 1032      PT SHORT TERM GOAL #1   Title patient to be independent with initial HEP   Status Achieved     PT SHORT TERM GOAL #2   Title patient to improve R knee PROM to 0-100   Status Partially Met           PT Long Term Goals - 09/03/17 1033      PT LONG TERM GOAL #1   Title patient to be independent with advanced HEP    Status Partially Met  met for current      PT LONG TERM GOAL #2   Title patient to demonstrate R active SLR with no quad lag   Status On-going  still with slight quad lag      PT LONG TERM GOAL #3   Title Patient to improve R knee AROM to 0-100   Status On-going  AROM: 2-89 dg      PT LONG TERM  GOAL #4   Title Patient to demonstrate  good heel toe gait with LRAD over various surfaces demonstrating improved functional mobility   Status Achieved               Plan - 09/03/17 1023    Clinical Impression Statement Pt. noting she wishes to continue with therapy to improve ability to navigate stairs, and performing house hold chores (sweeping, cleaning tub, etc).  Tolerated advancement in functional strengthening well.  Only slight improvement in AROM today 2-89 dg and PROM 0-95 dg today.  Slow ROM improvement likely due to pt. late start with outpatient therapy following HHPT initially after surgery.  Gait pattern normalizing with pt. able to walk +300 ft without AD without fatigue or pain noted.  Still with slight quad lag with SLR and difficulty bending down to floor for household chores per pt. report.  Some time spent today discussing bending and kneeling strategies to improve ability to get down to floor for house hold chores with pt. demonstrating difficulty with this.       PT Treatment/Interventions ADLs/Self Care Home Management;Cryotherapy;Electrical Stimulation;Iontophoresis '4mg'$ /ml Dexamethasone;Moist Heat;Ultrasound;Neuromuscular re-education;Balance training;Therapeutic exercise;Therapeutic activities;Functional mobility training;Stair training;Gait training;Patient/family education;Manual techniques;Passive range of motion;Vasopneumatic Device;Taping;Dry needling      Patient will benefit from skilled therapeutic intervention in order to improve the following deficits and impairments:  Abnormal gait, Decreased activity tolerance, Decreased balance, Decreased range of motion, Decreased mobility, Decreased strength, Difficulty walking, Pain  Visit Diagnosis: Acute pain of right knee  Stiffness of right knee, not elsewhere classified  Difficulty in walking, not elsewhere classified  Other abnormalities of gait and mobility  Left knee pain, unspecified  chronicity     Problem List Patient Active Problem List   Diagnosis Date Noted  . HCAP (healthcare-associated pneumonia) 06/03/2017  . Sepsis ([place]) 06/03/2017  . S/P TKR (total knee replacement), right 06/03/2017  . Type 2 diabetes mellitus without complication ([place]) 74/71/8550  . Arthritis of knee 05/31/2017  . Essential hypertension 08/25/2013  . GERD (gastroesophageal reflux disease) 05/23/2013  . Narcotic withdrawal ([place]) 05/23/2013  . Acute bronchitis 05/23/2013  . Osteoarthritis of left knee 04/08/2013    Class: Diagnosis of  . Upper airway cough syndrome 10/16/2012    Bess Harvest, PTA 09/03/17 1:17 PM  [place] [address]  [place], [place], [address] Phone: [phone]   Fax:  [phone]  Name: Gloria Patterson MRN: [id number] Date of Birth: [birth year]

## 2017-09-11 NOTE — Telephone Encounter (Signed)
error 

## 2017-09-12 ENCOUNTER — Ambulatory Visit: Payer: Medicare Other | Admitting: Physical Therapy

## 2017-09-12 DIAGNOSIS — M25661 Stiffness of right knee, not elsewhere classified: Secondary | ICD-10-CM

## 2017-09-12 DIAGNOSIS — M25562 Pain in left knee: Secondary | ICD-10-CM

## 2017-09-12 DIAGNOSIS — R2689 Other abnormalities of gait and mobility: Secondary | ICD-10-CM

## 2017-09-12 DIAGNOSIS — R262 Difficulty in walking, not elsewhere classified: Secondary | ICD-10-CM

## 2017-09-12 DIAGNOSIS — M25561 Pain in right knee: Secondary | ICD-10-CM

## 2017-09-12 NOTE — Therapy (Addendum)
[place] [address]  [place], [place], [address] Phone: [phone]   Fax:  [phone]  Physical Therapy Treatment  Patient Details  Name: Gloria Patterson MRN: [id number] Date of Birth: Dec 27, 1950 Referring Provider: Dr. Meredith Pel  Encounter Date: 09/12/2017      PT End of Session - 09/12/17 1106    Visit Number 10   Number of Visits 12   Authorization Type Medicare/Medicaid   PT Start Time 1103   PT Stop Time 1144   PT Time Calculation (min) 41 min   Activity Tolerance Patient tolerated treatment well   Behavior During Therapy [facility] for tasks assessed/performed      Past Medical History:  Diagnosis Date  . Arthritis    knee, shouder  . Asthma    MILD  . Chronic cough   . Constipation   . Diabetes mellitus without complication ([place])    Type II  . Frequency of urination   . GERD (gastroesophageal reflux disease)   . Hepatitis    Hepatitis C treated   . 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 History of kidney stones    passed  . Hypertension   . Nocturia   . Seizures ([facility])    due to Phenobarbital.  Patient not sure why she was given Phenobarital,   " before 1978"  . Urgency of urination   . Vaginal vault prolapse    ANTERIOR    Past Surgical History:  Procedure Laterality Date  . ANTERIOR AND POSTERIOR REPAIR N/A 03/10/2013   Procedure: ANTERIOR (CYSTOCELE) AND POSTERIOR REPAIR (RECTOCELE);  Surgeon: Ailene Rud, MD;  Location[facility];  Service: Urology;  Laterality: N/A;   [employer] UPHOLD LITE ANTERIOR VAULT REPAIR  POSSIBLE OP WITH OBSERVATION POSSIBLE FLOOR BED  Uphold LITE w/Capio SLIM Catalog number [id number] qty 2 Xenform soft tissue repair matrix 6x7 cm [id number]   . BREAST  REDUCTION SURGERY    . BREAST SURGERY     B/L removal of benign cysts  . COLONOSCOPY W/ POLYPECTOMY    . CYSTOCELE REPAIR N/A 03/10/2013   Procedure:  Anterior vaginal vault prolapse repair, with [employer] uphold light sacrospinous fixation using U. device and mesh repair with Claiborne Billings plication   ;  Surgeon: Ailene Rud, MD;  Location: [facility];  Service: Urology;  Laterality: N/A;  . ESOPHAGOGASTRODUODENOSCOPY    . TOTAL KNEE ARTHROPLASTY Left 04/08/2013   Procedure: TOTAL KNEE ARTHROPLASTY;  Surgeon: Meredith Pel, MD;  Location: [place];  Service: Orthopedics;  Laterality: Left;  Left total knee arthroplasty  . TOTAL KNEE ARTHROPLASTY Right 05/31/2017   Procedure: RIGHT TOTAL KNEE ARTHROPLASTY;  Surgeon: Meredith Pel, MD;  Location: [place];  Service: Orthopedics;  Laterality: Right;  . TUBAL LIGATION      There were no vitals filed for this visit.      Subjective Assessment - 09/12/17 1630    Subjective patient doing well   Pertinent History L TKA, asthma, HTN, DM   Patient Stated Goals improve function of R knee; "I want it to bend more."   Currently in Pain? No/denies   Pain Score 0-No pain  [facility] PT Assessment - 09/26/2017 0001      AROM   Right Knee Extension 0   Right Knee Flexion 87                     [facility] Adult PT Treatment/Exercise - 09-26-17 1107      Knee/Hip Exercises: Aerobic   Recumbent Bike 6 min - partial revolutions     Knee/Hip Exercises: Standing   Forward Step Up Right;Left;15 reps;Hand Hold: 1;Step Height: 8"   Functional Squat 20 reps   Functional Squat Limitations TRX   Other Standing Knee Exercises side stepping - green tband x 40 feet each direction     Knee/Hip Exercises: Seated   Long Arc Quad Right;15 reps   Long Arc Quad Weight 3 lbs.   Hamstring Curl Right;15 reps   Hamstring Limitations green tband                  PT Short Term Goals - 09/03/17 1032       PT SHORT TERM GOAL #1   Title patient to be independent with initial HEP   Status Achieved     PT SHORT TERM GOAL #2   Title patient to improve R knee PROM to 0-100   Status Partially Met           PT Long Term Goals - September 26, 2017 1632      PT LONG TERM GOAL #1   Title patient to be independent with advanced HEP    Status Achieved     PT LONG TERM GOAL #2   Title patient to demonstrate R active SLR with no quad lag   Status Achieved     PT LONG TERM GOAL #3   Title Patient to improve R knee AROM to 0-100   Status Partially Met     PT LONG TERM GOAL #4   Title Patient to demonstrate good heel toe gait with LRAD over various surfaces demonstrating improved functional mobility   Status Achieved               Plan - Sep 26, 2017 1107    Clinical Impression Statement Patient doing well today. Ambulating at all times without SPC with good stability and no evidence of LOB. Discussion today regarding d/c vs 30 day hold as knee flexion ROM is the only lacking impairment as this time - hopeful this will continue to progress as patient continues to stretch independently. Patient able to transition to HEP at this time. WIll plan to leave chart open for 30 days in the event she may need to return.    PT Treatment/Interventions ADLs/Self Care Home Management;Cryotherapy;Electrical Stimulation;Iontophoresis '4mg'$ /ml Dexamethasone;Moist Heat;Ultrasound;Neuromuscular re-education;Balance training;Therapeutic exercise;Therapeutic activities;Functional mobility training;Stair training;Gait training;Patient/family education;Manual techniques;Passive range of motion;Vasopneumatic Device;Taping;Dry needling   Consulted and Agree with Plan of Care Patient      Patient will benefit from skilled therapeutic intervention in order to improve the following deficits and impairments:  Abnormal gait, Decreased activity tolerance, Decreased balance, Decreased range of motion, Decreased mobility, Decreased  strength, Difficulty walking, Pain  Visit Diagnosis: Acute pain of right knee  Stiffness of right knee, not elsewhere classified  Difficulty in walking, not elsewhere classified  Other abnormalities of gait and mobility  Left knee pain, unspecified chronicity       G-Codes - 2017/09/26 1802    Functional Assessment Tool Used (Outpatient Only) FOTO: 80 (20% limited)   Functional Limitation Mobility: Walking and moving around   Mobility: Walking and  Moving Around Goal Status [phone]) At least 20 percent but less than 40 percent impaired, limited or restricted   Mobility: Walking and Moving Around Discharge Status [phone]) At least 20 percent but less than 40 percent impaired, limited or restricted      Problem List Patient Active Problem List   Diagnosis Date Noted  . HCAP (healthcare-associated pneumonia) 06/03/2017  . Sepsis ([place]) 06/03/2017  . S/P TKR (total knee replacement), right 06/03/2017  . Type 2 diabetes mellitus without complication ([place]) 11/64/3539  . Arthritis of knee 05/31/2017  . Essential hypertension 08/25/2013  . GERD (gastroesophageal reflux disease) 05/23/2013  . Narcotic withdrawal ([place]) 05/23/2013  . Acute bronchitis 05/23/2013  . Osteoarthritis of left knee 04/08/2013    Class: Diagnosis of  . Upper airway cough syndrome 10/16/2012     Lanney Gins, PT, DPT 09/12/17 6:04 PM  PHYSICAL THERAPY DISCHARGE SUMMARY  Visits from Start of Care: 10  Current functional level related to goals / functional outcomes: See above   Remaining deficits: See above   Education / Equipment: HEP  Plan: Patient agrees to discharge.  Patient goals were partially met. Patient is being discharged due to being pleased with the current functional level.  ?????    Lanney Gins, PT, DPT 10/30/17 3:24 PM   [place] [address]  [place], [place], [address] Phone: [phone]   Fax:   [phone]  Name: KEERAT DENICOLA MRN: [id number] Date of Birth: 06-Oct-1951

## 2019-06-25 ENCOUNTER — Encounter (HOSPITAL_BASED_OUTPATIENT_CLINIC_OR_DEPARTMENT_OTHER): Payer: Self-pay | Admitting: Emergency Medicine

## 2019-06-25 ENCOUNTER — Other Ambulatory Visit: Payer: Self-pay

## 2019-06-25 ENCOUNTER — Emergency Department (HOSPITAL_BASED_OUTPATIENT_CLINIC_OR_DEPARTMENT_OTHER): Payer: Medicare Other

## 2019-06-25 ENCOUNTER — Emergency Department (HOSPITAL_BASED_OUTPATIENT_CLINIC_OR_DEPARTMENT_OTHER)
Admission: EM | Admit: 2019-06-25 | Discharge: 2019-06-25 | Disposition: A | Discharge status: Home / Self Care | Payer: Medicare Other | Attending: Emergency Medicine | Admitting: Emergency Medicine

## 2019-06-25 DIAGNOSIS — Z79899 Other long term (current) drug therapy: Secondary | ICD-10-CM | POA: Insufficient documentation

## 2019-06-25 DIAGNOSIS — Z7984 Long term (current) use of oral hypoglycemic drugs: Secondary | ICD-10-CM | POA: Insufficient documentation

## 2019-06-25 DIAGNOSIS — S60211A Contusion of right wrist, initial encounter: Secondary | ICD-10-CM | POA: Diagnosis not present

## 2019-06-25 DIAGNOSIS — Y929 Unspecified place or not applicable: Secondary | ICD-10-CM | POA: Diagnosis not present

## 2019-06-25 DIAGNOSIS — Y999 Unspecified external cause status: Secondary | ICD-10-CM | POA: Diagnosis not present

## 2019-06-25 DIAGNOSIS — S6991XA Unspecified injury of right wrist, hand and finger(s), initial encounter: Secondary | ICD-10-CM | POA: Diagnosis present

## 2019-06-25 DIAGNOSIS — W19XXXA Unspecified fall, initial encounter: Secondary | ICD-10-CM | POA: Diagnosis not present

## 2019-06-25 DIAGNOSIS — Z96653 Presence of artificial knee joint, bilateral: Secondary | ICD-10-CM | POA: Insufficient documentation

## 2019-06-25 DIAGNOSIS — J45909 Unspecified asthma, uncomplicated: Secondary | ICD-10-CM | POA: Diagnosis not present

## 2019-06-25 DIAGNOSIS — E119 Type 2 diabetes mellitus without complications: Secondary | ICD-10-CM | POA: Diagnosis not present

## 2019-06-25 DIAGNOSIS — Y939 Activity, unspecified: Secondary | ICD-10-CM | POA: Diagnosis not present

## 2019-06-25 DIAGNOSIS — Z7901 Long term (current) use of anticoagulants: Secondary | ICD-10-CM | POA: Diagnosis not present

## 2019-06-25 DIAGNOSIS — S60219A Contusion of unspecified wrist, initial encounter: Secondary | ICD-10-CM

## 2019-06-25 NOTE — Discharge Instructions (Addendum)
No fracture x-ray.  Likely bone contusion.  Recommend Tylenol, Motrin, ice.

## 2019-06-25 NOTE — ED Notes (Signed)
Patient transported to X-ray 

## 2019-06-25 NOTE — ED Provider Notes (Signed)
[facility] EMERGENCY DEPARTMENT Provider Note   CSN: [id number] Arrival date & time: 06/25/19  2133    History   Chief Complaint Chief Complaint  Patient presents with  . Fall    HPI Gloria Patterson is a 68 y.o. female.     The history is provided by the patient.  Fall This is a new problem. The current episode started less than 1 hour ago. The problem has not changed since onset.Pertinent negatives include no chest pain, no abdominal pain and no shortness of breath. Associated symptoms comments: Right wrist pain . Nothing aggravates the symptoms. Nothing relieves the symptoms. She has tried nothing for the symptoms. The treatment provided no relief.    Past Medical History:  Diagnosis Date  . Arthritis    knee, shouder  . Asthma    MILD  . Chronic cough   . Constipation   . Diabetes mellitus without complication ([place])    Type II  . Frequency of urination   . GERD (gastroesophageal reflux disease)   . Hepatitis    Hepatitis C treated   . 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 History of kidney stones    passed  . Hypertension   . Nocturia   . 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  Patient not sure why she was given Phenobarital,   " before 1978"  . Urgency of urination   . Vaginal vault prolapse    ANTERIOR    Patient Active Problem List   Diagnosis Date Noted  . HCAP (healthcare-associated pneumonia) 06/03/2017  . Sepsis ([place]) 06/03/2017  . S/P TKR (total knee replacement), right 06/03/2017  . Type 2 diabetes mellitus without complication ([place]) 53/97/6734  . Arthritis of knee 05/31/2017  . Essential hypertension 08/25/2013  . GERD (gastroesophageal reflux disease) 05/23/2013  . Narcotic withdrawal ([place]) 05/23/2013  . Acute bronchitis 05/23/2013  . Osteoarthritis of left knee  04/08/2013    Class: Diagnosis of  . Upper airway cough syndrome 10/16/2012    Past Surgical History:  Procedure Laterality Date  . ANTERIOR AND POSTERIOR REPAIR N/A 03/10/2013   Procedure: ANTERIOR (CYSTOCELE) AND POSTERIOR REPAIR (RECTOCELE);  Surgeon: Ailene Rud, MD;  Location: [facility];  Service: Urology;  Laterality: N/A;   BOSTON SCIENTIFIC UPHOLD LITE ANTERIOR VAULT REPAIR  POSSIBLE OP WITH OBSERVATION POSSIBLE FLOOR BED  Uphold LITE w/Capio SLIM Catalog number [id number] qty 2 Xenform soft tissue repair matrix 6x7 cm [id number]   . BREAST REDUCTION SURGERY    . BREAST SURGERY     B/L removal of benign cysts  . COLONOSCOPY W/ POLYPECTOMY    . CYSTOCELE REPAIR N/A 03/10/2013   Procedure:  Anterior vaginal vault prolapse repair, with [employer] uphold light sacrospinous fixation using U. device and mesh repair with Claiborne Billings plication   ;  Surgeon: Ailene Rud, MD;  Location: [facility];  Service: Urology;  Laterality: N/A;  . ESOPHAGOGASTRODUODENOSCOPY    . TOTAL KNEE ARTHROPLASTY Left 04/08/2013   Procedure: TOTAL KNEE ARTHROPLASTY;  Surgeon: Meredith Pel, MD;  Location: [facility];  Service: Orthopedics;  Laterality: Left;  Left total knee arthroplasty  . TOTAL KNEE ARTHROPLASTY Right 05/31/2017   Procedure: RIGHT TOTAL KNEE ARTHROPLASTY;  Surgeon: Alphonzo Severance  Earl LitesGregory, MD;  [facility];  Service: Orthopedics;  Laterality: Right;  . TUBAL LIGATION       OB History   No obstetric history on file.      Home Medications    Prior to Admission medications   Medication Sig Start Date End Date Taking? Authorizing Provider  albuterol (PROVENTIL HFA) 108 (90 BASE) MCG/ACT inhaler Inhale 2 puffs into the lungs every 4 (four) hours as needed for wheezing or shortness of breath.     [provider]  atorvastatin (LIPITOR) 20 MG tablet Take 20 mg by mouth at bedtime.    [provider]  bisacodyl  (DULCOLAX) 10 MG suppository Place 1 suppository (10 mg total) rectally daily as needed for moderate constipation. Patient not taking: Reported on 07/23/2017 06/04/17   Pearson GrippeKim, James, MD  cloNIDine (CATAPRES) 0.1 MG tablet Take 1 tablet (0.1 mg total) by mouth 2 (two) times daily. 08/25/13   Nyoka CowdenWert, Michael B, MD  HYDROmorphone (DILAUDID) 4 MG tablet Take 1 tablet (4 mg total) by mouth every 4 (four) hours as needed for severe pain. Patient not taking: Reported on 07/23/2017 06/04/17   Pearson GrippeKim, James, MD  metFORMIN (GLUCOPHAGE) 500 MG tablet TAKE 1 TABLET BY MOUTH EVERY DAY WITH FOOD - OFFICE VISIT BEFORE NEXT REFILL 04/16/17   [provider]  methocarbamol (ROBAXIN) 500 MG tablet Take 1 tablet (500 mg total) by mouth every 6 (six) hours as needed for muscle spasms. Patient not taking: Reported on 07/23/2017 06/01/17   Cammy Copaean, Gregory Scott, MD  Multiple Vitamin (MULTIVITAMIN WITH MINERALS) TABS tablet Take 1 tablet by mouth daily.    [provider]  Nutritional Supplements (ESTROVEN PO) Take 1 tablet by mouth daily.    [provider]  omeprazole (PRILOSEC) 40 MG capsule Take 1 capsule (40 mg total) by mouth daily. 11/25/13   Nyoka CowdenWert, Michael B, MD  ondansetron (ZOFRAN) 4 MG/2ML SOLN injection Inject 2 mLs (4 mg total) into the vein every 6 (six) hours as needed for nausea or vomiting. Patient not taking: Reported on 07/23/2017 06/04/17   Pearson GrippeKim, James, MD  polyethylene glycol [facility](MIRALAX / Ethelene HalGLYCOLAX) packet Take 17 g by mouth daily as needed for mild constipation.     [provider]  rivaroxaban (XARELTO) 10 MG TABS tablet Take 1 tablet (10 mg total) by mouth daily with breakfast. Patient not taking: Reported on 07/23/2017 06/01/17   Cammy Copaean, Gregory Scott, MD  triamcinolone cream (KENALOG) 0.1 % Apply 1 application topically daily as needed for itching or rash. 02/21/17   [provider]    Family History Family History  Problem Relation Age of Onset  . Prostate cancer Father   . Breast  cancer Sister     Social History Social History   Tobacco Use  . Smoking status: Never Smoker  . Smokeless tobacco: Never Used  . Tobacco comment: CANNOT HAVE NARCOTIC MEDICATIONS PER PT  Substance Use Topics  . Alcohol use: No  . Drug use: No    Comment: Stopped using  Cocoine and Heroin 2011     Allergies   Phenobarbital   Review of Systems Review of Systems  Constitutional: Negative for chills and fever.  HENT: Negative for ear pain and sore throat.   Eyes: Negative for pain and visual disturbance.  Respiratory: Negative for cough and shortness of breath.   Cardiovascular: Negative for chest pain and palpitations.  Gastrointestinal: Negative for abdominal pain and vomiting.  Genitourinary: Negative for dysuria and hematuria.  Musculoskeletal:  Positive for arthralgias. Negative for back pain.  Skin: Negative for color change and rash.  Neurological: Negative for seizures and syncope.  All other systems reviewed and are negative.    Physical Exam Updated Vital Signs  ED Triage Vitals  Enc Vitals Group     BP 06/25/19 2144 (!) 156/65     Pulse Rate 06/25/19 2144 62     Resp 06/25/19 2144 20     Temp 06/25/19 2144 98.7 F (37.1 C)     Temp Source 06/25/19 2144 Oral     SpO2 06/25/19 2144 96 %     Weight 06/25/19 2140 153 lb (69.4 kg)     Height 06/25/19 2140 5\' 1"  (1.549 m)     Head Circumference --      Peak Flow --      Pain Score 06/25/19 2140 10     Pain Loc --      Pain Edu? --      Excl. in GC? --     Physical Exam Vitals signs and nursing note reviewed.  Constitutional:      General: She is not in acute distress.    Appearance: She is well-developed.  HENT:     Head: Normocephalic and atraumatic.     Nose: Nose normal.     Mouth/Throat:     Mouth: Mucous membranes are moist.  Eyes:     Extraocular Movements: Extraocular movements intact.     Conjunctiva/sclera: Conjunctivae normal.     Pupils: Pupils are equal, round, and reactive to  light.  Neck:     Musculoskeletal: Normal range of motion and neck supple.  Cardiovascular:     Rate and Rhythm: Normal rate and regular rhythm.     Heart sounds: No murmur.  Pulmonary:     Effort: Pulmonary effort is normal. No respiratory distress.     Breath sounds: Normal breath sounds.  Abdominal:     Palpations: Abdomen is soft.     Tenderness: There is no abdominal tenderness.  Musculoskeletal: Normal range of motion.        General: Tenderness present.     Comments: ttp to right wrist  Skin:    General: Skin is warm and dry.     Capillary Refill: Capillary refill takes less than 2 seconds.  Neurological:     General: No focal deficit present.     Mental Status: She is alert and oriented to person, place, and time.     Cranial Nerves: No cranial nerve deficit.     Sensory: No sensory deficit.     Motor: No weakness.     Coordination: Coordination normal.      ED Treatments / Results  Labs (all labs ordered are listed, but only abnormal results are displayed) Labs Reviewed - No data to display  EKG None  Radiology Dg Hand Complete Right  Result Date: 06/25/2019 CLINICAL DATA:  Initial evaluation for acute trauma, fall. EXAM: RIGHT HAND - COMPLETE 3+ VIEW COMPARISON:  None. FINDINGS: No acute fracture or dislocation. Mild scattered osteoarthritic changes noted throughout the hand. Osseous mineralization normal. No appreciable soft tissue injury. IMPRESSION: No acute osseous abnormality about the right hand. Electronically Signed   By: Rise MuBenjamin  McClintock M.D.   On: 06/25/2019 22:00    Procedures Procedures (including critical care time)  Medications Ordered in ED Medications - No data to display   Initial Impression / Assessment and Plan / ED Course  I have reviewed the triage vital signs  and the nursing notes.  Pertinent labs & imaging results that were available during my care of the patient were reviewed by me and considered in my medical decision making  (see chart for details).        Threasa HeadsDorothy J Clinkscales is a 68 year old female who presents to the ED with right wrist pain after fall.  Tender on the right wrist.  No hand tenderness.  No anatomical snuffbox tenderness.  Doubt scaphoid injury.  Patient with negative x-ray.  Recommend Tylenol, Motrin, ice.  Patient neurovascularly neuromuscularly intact.  Discharged from ED in good condition.  This chart was dictated using voice recognition software.  Despite best efforts to proofread,  errors can occur which can change the documentation meaning.    Final Clinical Impressions(s) / ED Diagnoses   Final diagnoses:  Contusion of wrist, unspecified laterality, initial encounter    ED Discharge Orders    None       Virgina NorfolkCuratolo, Jessamy Torosyan, DO 06/25/19 2215

## 2019-06-25 NOTE — ED Triage Notes (Signed)
Pt states she was playing with her grandchild slipped and fell in the grass  Pt states she landed on her right hand  Pt's right hand is swollen and painful  Incident happened around 4 pm today

## 2021-11-25 ENCOUNTER — Encounter (HOSPITAL_BASED_OUTPATIENT_CLINIC_OR_DEPARTMENT_OTHER): Payer: Self-pay | Admitting: *Deleted

## 2021-11-25 ENCOUNTER — Emergency Department (HOSPITAL_BASED_OUTPATIENT_CLINIC_OR_DEPARTMENT_OTHER): Payer: Medicare Other

## 2021-11-25 ENCOUNTER — Other Ambulatory Visit: Payer: Self-pay

## 2021-11-25 ENCOUNTER — Emergency Department (HOSPITAL_BASED_OUTPATIENT_CLINIC_OR_DEPARTMENT_OTHER)
Admission: EM | Admit: 2021-11-25 | Discharge: 2021-11-25 | Disposition: A | Discharge status: Home / Self Care | Payer: Medicare Other | Attending: Student | Admitting: Student

## 2021-11-25 ENCOUNTER — Other Ambulatory Visit (HOSPITAL_BASED_OUTPATIENT_CLINIC_OR_DEPARTMENT_OTHER): Payer: Self-pay

## 2021-11-25 DIAGNOSIS — I10 Essential (primary) hypertension: Secondary | ICD-10-CM | POA: Diagnosis not present

## 2021-11-25 DIAGNOSIS — W19XXXA Unspecified fall, initial encounter: Secondary | ICD-10-CM | POA: Diagnosis not present

## 2021-11-25 DIAGNOSIS — Z7984 Long term (current) use of oral hypoglycemic drugs: Secondary | ICD-10-CM | POA: Insufficient documentation

## 2021-11-25 DIAGNOSIS — Z79899 Other long term (current) drug therapy: Secondary | ICD-10-CM | POA: Diagnosis not present

## 2021-11-25 DIAGNOSIS — E119 Type 2 diabetes mellitus without complications: Secondary | ICD-10-CM | POA: Diagnosis not present

## 2021-11-25 DIAGNOSIS — Z96653 Presence of artificial knee joint, bilateral: Secondary | ICD-10-CM | POA: Diagnosis not present

## 2021-11-25 DIAGNOSIS — M25511 Pain in right shoulder: Secondary | ICD-10-CM | POA: Insufficient documentation

## 2021-11-25 DIAGNOSIS — R0789 Other chest pain: Secondary | ICD-10-CM | POA: Insufficient documentation

## 2021-11-25 DIAGNOSIS — J45909 Unspecified asthma, uncomplicated: Secondary | ICD-10-CM | POA: Diagnosis not present

## 2021-11-25 DIAGNOSIS — R519 Headache, unspecified: Secondary | ICD-10-CM | POA: Diagnosis not present

## 2021-11-25 DIAGNOSIS — G8911 Acute pain due to trauma: Secondary | ICD-10-CM | POA: Insufficient documentation

## 2021-11-25 DIAGNOSIS — R0781 Pleurodynia: Secondary | ICD-10-CM | POA: Diagnosis present

## 2021-11-25 MED ORDER — LIDOCAINE 4 % EX PTCH
1.0000 | MEDICATED_PATCH | Freq: Two times a day (BID) | CUTANEOUS | 0 refills | Status: AC
  Filled 2021-11-25: qty 15, 7d supply, fill #0

## 2021-11-25 MED ORDER — LIDOCAINE 5 % EX PTCH
1.0000 | MEDICATED_PATCH | CUTANEOUS | Status: DC
  Administered 2021-11-25: 1 via TRANSDERMAL
  Filled 2021-11-25: qty 1

## 2021-11-25 MED ORDER — HYDROCODONE-ACETAMINOPHEN 5-325 MG PO TABS
1.0000 | ORAL_TABLET | Freq: Once | ORAL | Status: AC
  Administered 2021-11-25: 1 via ORAL
  Filled 2021-11-25: qty 1

## 2021-11-25 NOTE — ED Triage Notes (Signed)
Fell last pm  c/o rt rib, arm , shoulder pain, fell to floor,  did not hit head

## 2021-11-25 NOTE — ED Notes (Signed)
Patient given discharge instructions, all questions answered. Patient in possession of all belongings, directed to the discharge area  

## 2021-11-25 NOTE — ED Provider Notes (Signed)
[facility] EMERGENCY DEPARTMENT Provider Note   CSN: [id number] Arrival date & time: 11/25/21  1017     History Chief Complaint  Patient presents with   Rib Injury    Gloria Patterson is a 70 y.o. female with PMH T2DM who presents the emergency department for evaluation of a fall.  Patient states that she had a mechanical fall last night and is currently on a blood thinner.  Patient takes Xarelto.  She arrives with complaints of right rib, right arm and right shoulder pain.  No head strike, no loss of consciousness.  Patient alert and oriented answering all questions appropriately.  No external signs of trauma.  Denies abdominal pain, nausea, vomiting or other systemic symptoms.  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  Patient not sure why she was given Phenobarital,   " before 1978"   Urgency of urination    Vaginal vault prolapse    ANTERIOR    Patient Active Problem List   Diagnosis Date Noted   HCAP (healthcare-associated pneumonia) 06/03/2017   Sepsis ([place]) 06/03/2017   S/P TKR (total knee replacement), right 06/03/2017   Type 2 diabetes mellitus without complication ([place]) [id number]   Arthritis of knee 05/31/2017   Essential hypertension 08/25/2013   GERD (gastroesophageal reflux disease) 05/23/2013   Narcotic withdrawal ([place]) 05/23/2013   Acute bronchitis 05/23/2013   Osteoarthritis of left knee  04/08/2013    Class: Diagnosis of   Upper airway cough syndrome 10/16/2012    Past Surgical History:  Procedure Laterality Date   ANTERIOR AND POSTERIOR REPAIR N/A 03/10/2013   Procedure: ANTERIOR (CYSTOCELE) AND POSTERIOR REPAIR (RECTOCELE);  Surgeon: Ailene Rud, MD;  [facility];  Service: Urology;  Laterality: N/A;   BOSTON SCIENTIFIC UPHOLD LITE ANTERIOR VAULT REPAIR  POSSIBLE OP WITH OBSERVATION POSSIBLE FLOOR BED  Uphold LITE w/[employer] number [id number] qty 2 Xenform soft tissue repair matrix 6x7 cm [id number]    BREAST REDUCTION SURGERY     BREAST SURGERY     B/L removal of benign cysts   COLONOSCOPY W/ POLYPECTOMY     CYSTOCELE REPAIR N/A 03/10/2013   Procedure:  Anterior vaginal vault prolapse repair, with [employer] uphold light sacrospinous fixation using U. device and mesh repair with Claiborne Billings plication   ;  Surgeon: Ailene Rud, MD;  Location: [facility];  Service: Urology;  Laterality: N/A;   ESOPHAGOGASTRODUODENOSCOPY     TOTAL KNEE ARTHROPLASTY Left 04/08/2013   Procedure: TOTAL KNEE ARTHROPLASTY;  Surgeon: Meredith Pel, MD;  Location: [facility];  Service: Orthopedics;  Laterality: Left;  Left total knee arthroplasty   TOTAL KNEE ARTHROPLASTY Right 05/31/2017   Procedure:  RIGHT TOTAL KNEE ARTHROPLASTY;  Surgeon: Cammy Copa, MD;  Location: [facility] OR;  Service: Orthopedics;  Laterality: Right;   TUBAL LIGATION       OB History   No obstetric history on file.     Family History  Problem Relation Age of Onset   Prostate cancer Father    Breast cancer Sister     Social History   Tobacco Use   Smoking status: Never   Smokeless tobacco: Never   Tobacco comments:    CANNOT HAVE NARCOTIC MEDICATIONS PER PT  Vaping Use   Vaping Use: Never used  Substance Use Topics   Alcohol use: No   Drug use: No    Comment: Stopped using  Cocoine and Heroin 2011    Home Medications Prior  to Admission medications   Medication Sig Start Date End Date Taking? Authorizing Provider  amLODipine (NORVASC) 5 MG tablet Take 1 tablet by mouth daily. 11/22/20  Yes [provider]  atorvastatin (LIPITOR) 20 MG tablet Take by mouth. 03/13/16  Yes [provider]  famotidine (PEPCID) 40 MG tablet TAKE 1 TABLET BY MOUTH EVERY DAY AT NIGHT 05/27/19  Yes [provider]  fluticasone (FLOVENT HFA) 110 MCG/ACT inhaler Inhale into the lungs. 02/05/20  Yes [provider]  Lidocaine (SALONPAS PAIN RELIEVING) 4 % PTCH Apply 1 patch to affected area(s) topically every 12 (twelve) hours. 11/25/21  Yes Melvie Paglia, MD  lisinopril (ZESTRIL) 40 MG tablet Take by mouth. 05/19/21  Yes [provider]  metFORMIN (GLUCOPHAGE) 500 MG tablet Take by mouth. 01/25/15  Yes [provider]  metoprolol succinate (TOPROL-XL) 25 MG 24 hr tablet Take 1 tablet by mouth daily. 05/03/20  Yes [provider]  omeprazole (PRILOSEC) 40 MG capsule Take 1 tablet by mouth daily. 01/25/15  Yes [provider]  albuterol (PROVENTIL HFA) 108 (90 BASE) MCG/ACT inhaler Inhale 2 puffs into the lungs every 4 (four) hours as needed for wheezing or shortness of breath.     [provider]  atorvastatin (LIPITOR) 20 MG tablet Take 20 mg by mouth at bedtime.    [provider]  bisacodyl (DULCOLAX) 10 MG suppository Place 1 suppository (10 mg total) rectally daily as needed for moderate constipation. Patient not taking: Reported on 07/23/2017 06/04/17   Pearson Grippe, MD  cloNIDine (CATAPRES) 0.1 MG tablet Take 1 tablet (0.1 mg total) by mouth 2 (two) times daily. 08/25/13   Nyoka Cowden, MD  HYDROmorphone (DILAUDID) 4 MG tablet Take 1 tablet (4 mg total) by mouth every 4 (four) hours as needed for severe pain. Patient not taking: Reported on 07/23/2017 06/04/17   Pearson Grippe, MD  metFORMIN (GLUCOPHAGE) 500 MG tablet TAKE 1 TABLET BY MOUTH EVERY DAY WITH FOOD - OFFICE  VISIT BEFORE NEXT REFILL 04/16/17   [provider]  methadone (DOLOPHINE) 10 MG tablet Take by mouth.    [provider]  methocarbamol (ROBAXIN) 500 MG tablet Take 1 tablet (500 mg total) by mouth every 6 (six) hours as needed for muscle spasms. Patient not taking: Reported on 07/23/2017 06/01/17   Cammy Copa, MD  Multiple Vitamin (MULTIVITAMIN WITH MINERALS) TABS tablet Take 1 tablet by mouth daily.    [provider]  Nutritional Supplements (ESTROVEN PO) Take 1 tablet by mouth daily.    [provider]  omeprazole (PRILOSEC) 40 MG capsule Take 1 capsule (40 mg total) by mouth daily. 11/25/13   Nyoka Cowden, MD  ondansetron [facility])  4 MG/2ML SOLN injection Inject 2 mLs (4 mg total) into the vein every 6 (six) hours as needed for nausea or vomiting. Patient not taking: Reported on 07/23/2017 06/04/17   Jani Gravel, MD  polyethylene glycol [facility] / Floria Raveling) packet Take 17 g by mouth daily as needed for mild constipation.     [provider]  polyethylene glycol powder (GLYCOLAX/MIRALAX) 17 GM/SCOOP powder Take by mouth.    [provider]  rivaroxaban (XARELTO) 10 MG TABS tablet Take 1 tablet (10 mg total) by mouth daily with breakfast. Patient not taking: Reported on 07/23/2017 06/01/17   Meredith Pel, MD  traMADol (ULTRAM) 50 MG tablet Take by mouth.    [provider]  triamcinolone cream (KENALOG) 0.1 % Apply 1 application topically daily as needed for itching or rash. 02/21/17   [provider]    Allergies    Phenobarbital  Review of Systems   Review of Systems  Constitutional:  Negative for chills and fever.  HENT:  Negative for ear pain and sore throat.   Eyes:  Negative for pain and visual disturbance.  Respiratory:  Negative for cough and shortness of breath.   Cardiovascular:  Positive for chest pain. Negative for palpitations.  Gastrointestinal:  Negative for abdominal pain and vomiting.   Genitourinary:  Negative for dysuria and hematuria.  Musculoskeletal:  Positive for arthralgias. Negative for back pain.  Skin:  Negative for color change and rash.  Neurological:  Negative for seizures and syncope.  All other systems reviewed and are negative.  Physical Exam Updated Vital Signs BP 135/64 (BP Location: Left Arm)   Pulse 74   Temp 98.9 F (37.2 C) (Oral)   Resp 18   Ht 5\' 2"  (1.575 m)   Wt 70.8 kg   SpO2 97%   BMI 28.53 kg/m   Physical Exam Vitals and nursing note reviewed.  Constitutional:      General: She is not in acute distress.    Appearance: She is well-developed.  HENT:     Head: Normocephalic and atraumatic.  Eyes:     Conjunctiva/sclera: Conjunctivae normal.  Cardiovascular:     Rate and Rhythm: Normal rate and regular rhythm.     Heart sounds: No murmur heard. Pulmonary:     Effort: Pulmonary effort is normal. No respiratory distress.     Breath sounds: Normal breath sounds.  Abdominal:     Palpations: Abdomen is soft.     Tenderness: There is no abdominal tenderness.  Musculoskeletal:        General: Tenderness (Right shoulder, right chest wall) present. No swelling.     Cervical back: Neck supple.  Skin:    General: Skin is warm and dry.     Capillary Refill: Capillary refill takes less than 2 seconds.  Neurological:     Mental Status: She is alert.  Psychiatric:        Mood and Affect: Mood normal.    ED Results / Procedures / Treatments   Labs (all labs ordered are listed, but only abnormal results are displayed) Labs Reviewed - No data to display  EKG None  Radiology DG Shoulder Right  Result Date: 11/25/2021 CLINICAL DATA:  Trauma, fall EXAM: RIGHT SHOULDER - 2+ VIEW COMPARISON:  None. FINDINGS: No fracture or dislocation is seen. Degenerative changes are noted in the right [facility] joint. IMPRESSION: No fracture or dislocation is seen in the right shoulder. Electronically Signed   By: Elmer Picker M.D.   On: 11/25/2021  11:41   CT Head Wo Contrast  Result Date: 11/25/2021 CLINICAL DATA:  Head trauma.  Fall.  Shoulder pain. EXAM: CT HEAD WITHOUT CONTRAST TECHNIQUE: Contiguous axial images were obtained from the base of the skull through the vertex without intravenous contrast. COMPARISON:  None FINDINGS: Brain: No acute intracranial hemorrhage. No focal mass lesion. No CT evidence of acute infarction. No midline shift or mass effect. No hydrocephalus. Basilar cisterns are patent. Minimal periventricular and subcortical white matter hypodensities. Minimal generalized cortical atrophy. Vascular: No hyperdense vessel or unexpected calcification. Skull: Normal. Negative for fracture or focal lesion. Sinuses/Orbits: Paranasal sinuses and mastoid air cells are clear. Orbits are clear. Other: None. IMPRESSION: No acute intracranial findings.  No evidence trauma. Electronically Signed   By: Suzy Bouchard M.D.   On: 11/25/2021 11:48   CT Chest Wo Contrast  Result Date: 11/25/2021 CLINICAL DATA:  Fall.  Rib pain EXAM: CT CHEST WITHOUT CONTRAST TECHNIQUE: Multidetector CT imaging of the chest was performed following the standard protocol without IV contrast. COMPARISON:  None. FINDINGS: Cardiovascular: No significant vascular findings. Normal heart size. No pericardial effusion. Mediastinum/Nodes: No axillary or supraclavicular adenopathy. No mediastinal or hilar adenopathy. No pericardial fluid. Esophagus normal. Lungs/Pleura: No pulmonary contusion. No pneumothorax. No pleural fluid. Upper Abdomen: Limited view of the liver, kidneys, pancreas are unremarkable. Normal adrenal glands. Musculoskeletal: No evidence of rib fracture. Chronic superior endplate compression deformity at L1. IMPRESSION: 1. No evidence of thoracic trauma. 2. No RIGHT rib fracture. Electronically Signed   By: Suzy Bouchard M.D.   On: 11/25/2021 12:03    Procedures Procedures   Medications Ordered in ED Medications  lidocaine (LIDODERM) 5 % 1 patch  (has no administration in time range)  HYDROcodone-acetaminophen (NORCO/VICODIN) 5-325 MG per tablet 1 tablet (1 tablet Oral Given 11/25/21 1154)    ED Course  I have reviewed the triage vital signs and the nursing notes.  Pertinent labs & imaging results that were available during my care of the patient were reviewed by me and considered in my medical decision making (see chart for details).    MDM Rules/Calculators/A&P                           Patient seen in the emergency department for evaluation of right shoulder pain and right rib pain after a fall last night.  Physical exam is remarkable only for tenderness over the chest wall on the right and in the right shoulder but no external signs of trauma.  X-rays of the shoulder unremarkable.  CT of the head with no intracranial bleed.  CT of the chest with no rib fracture or traumatic injury.  Patient pain controlled with single dose Norco.  On reevaluation, patient pain well controlled and she was discharged. Final Clinical Impression(s) / ED Diagnoses Final diagnoses:  Fall, initial encounter  Chest wall pain    Rx / DC Orders ED Discharge Orders          Ordered    Lidocaine (SALONPAS PAIN RELIEVING) 4 % PTCH  Every 12 hours        11/25/21 1217             Jarrell Armond, Debe Coder, MD 11/25/21 1228

## 2022-08-07 IMAGING — CT CT CHEST W/O CM
2 of 3 series · 15 of 36 positions shown, 18 images · non-contrast
Comparison: None.

CLINICAL DATA: Fall.  Rib pain

EXAM:
CT CHEST WITHOUT CONTRAST
TECHNIQUE: Multidetector CT imaging of the chest was performed following the
standard protocol without IV contrast.

[Series 2: thorax · axial · 0.63mm/px · z∈[+975,+1225]mm · 12 of 147 slices shown, 15 images]
[im 11/147  mediastinal]
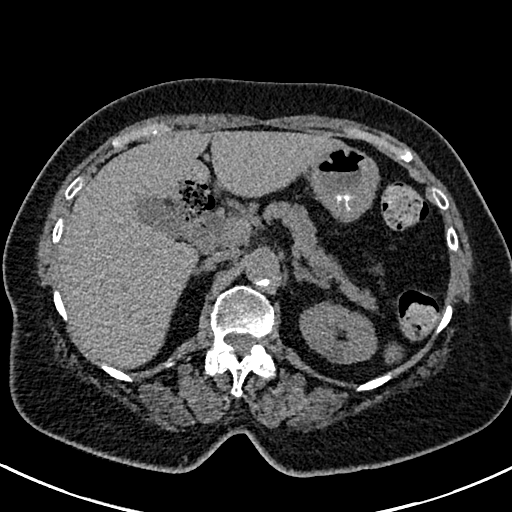
[im 11/147  lung]
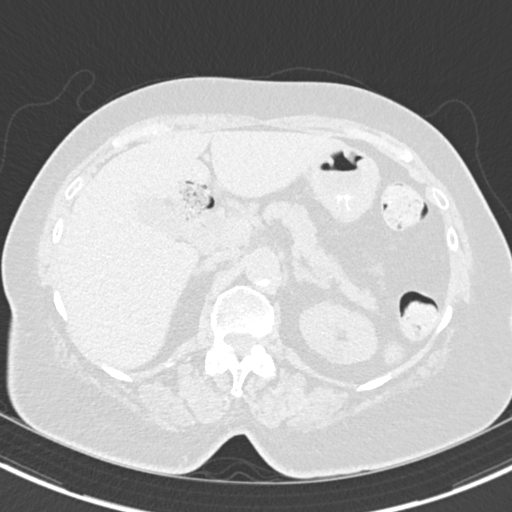
[im 22/147  lung]
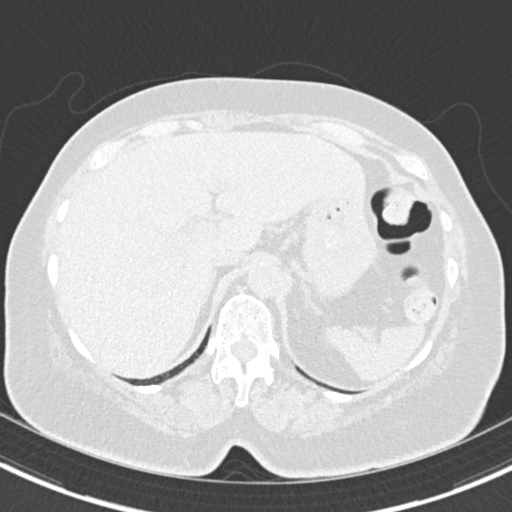
[im 33/147  lung]
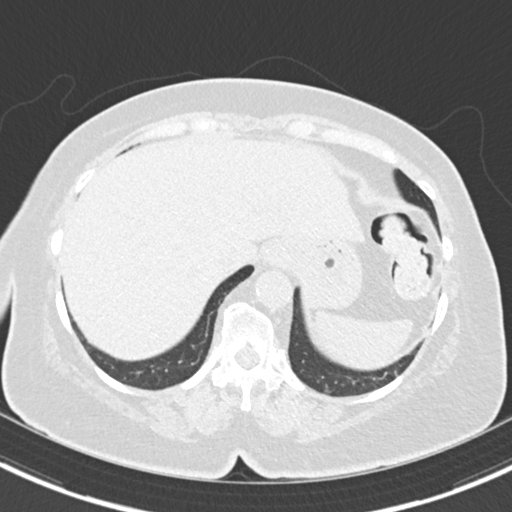
[im 44/147  lung]
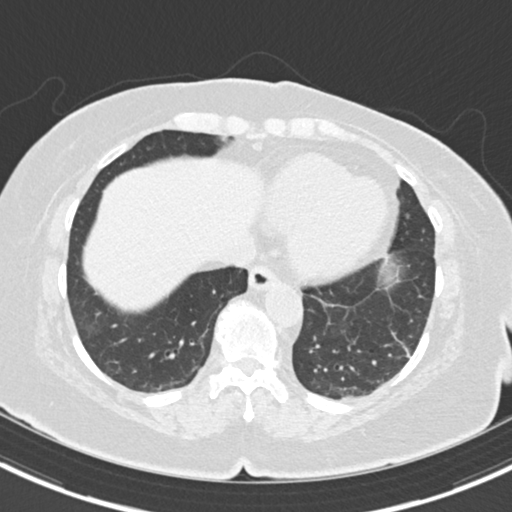
[im 55/147  mediastinal]
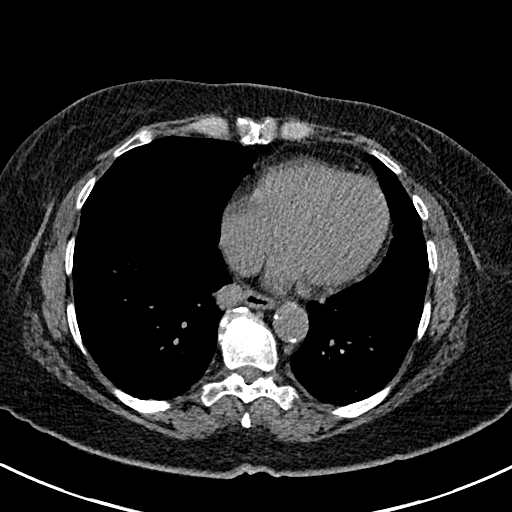
[im 55/147  lung]
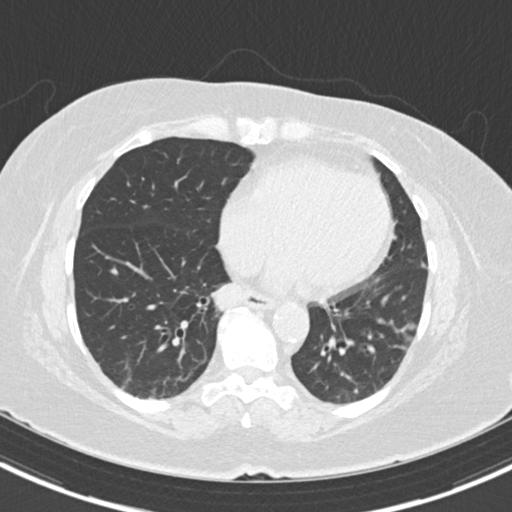
[im 65/147  lung]
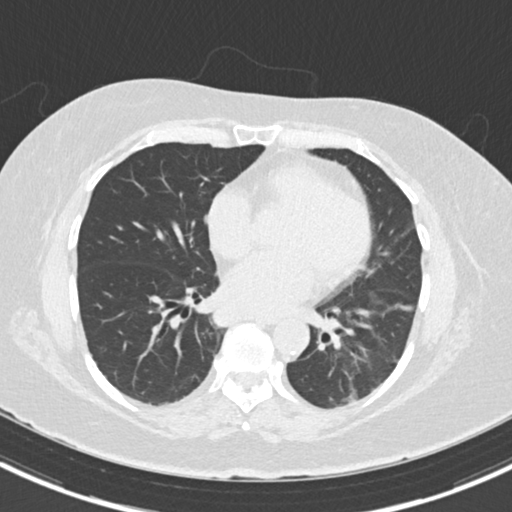
[im 82/147  lung]
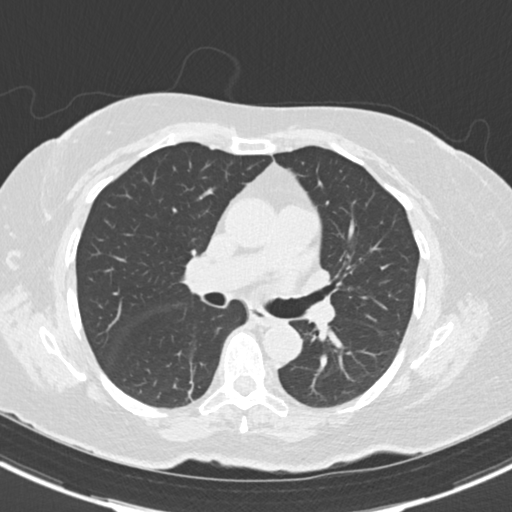
[im 92/147  lung]
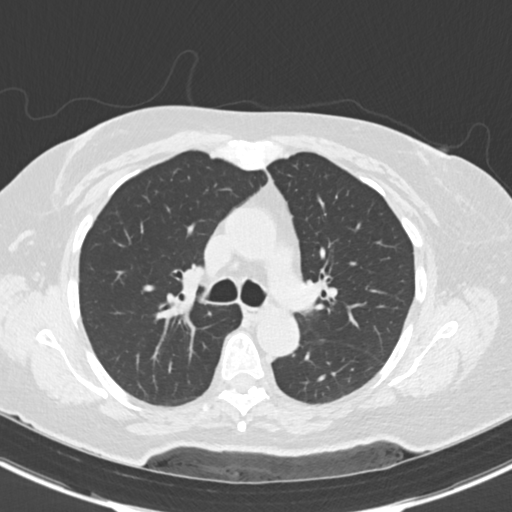
[im 103/147  mediastinal]
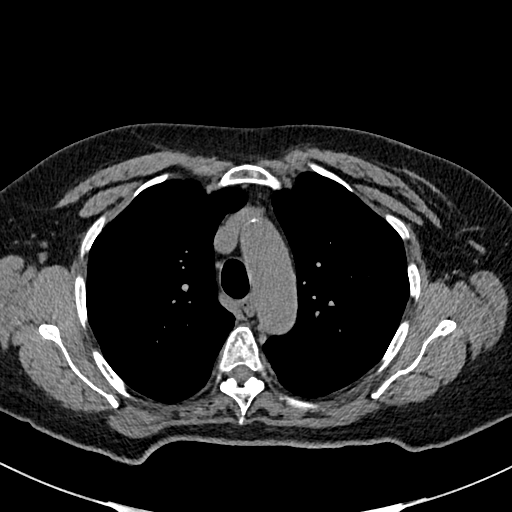
[im 103/147  lung]
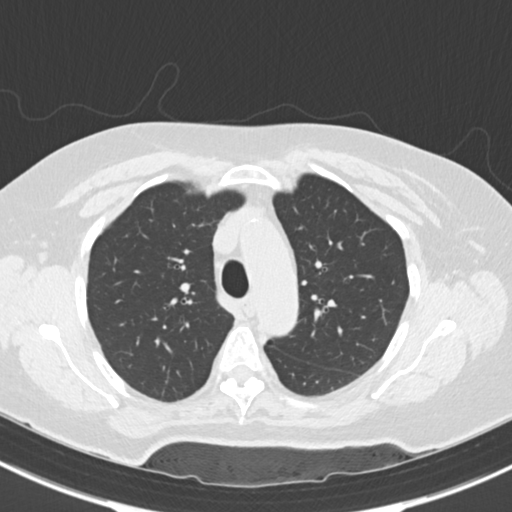
[im 114/147  lung]
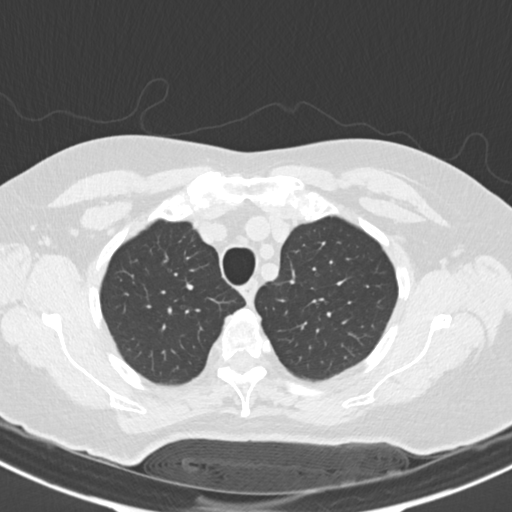
[im 125/147  lung]
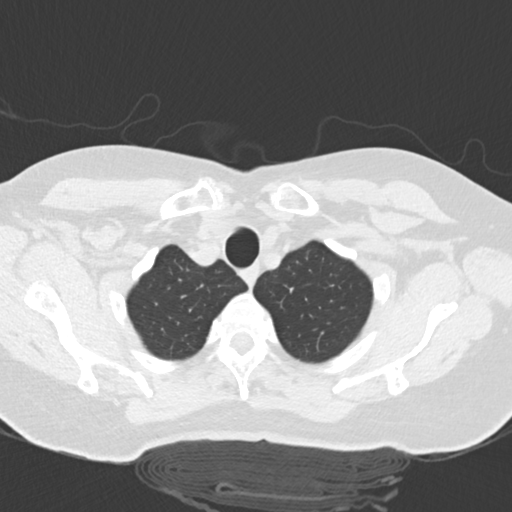
[im 136/147  lung]
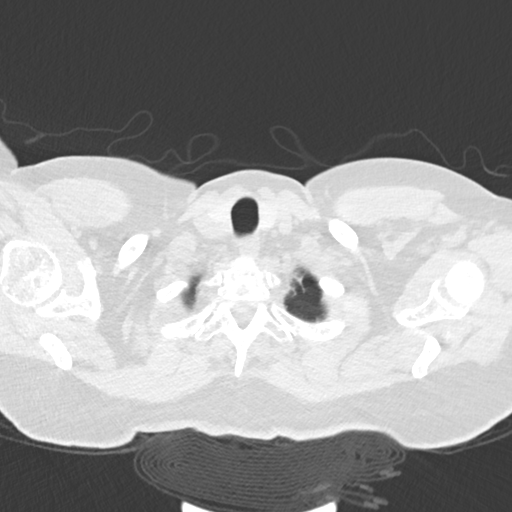

[Series 5: coronal · coronal · 0.58mm/px · 3 of 136 slices shown]
[im 28/136  lung]
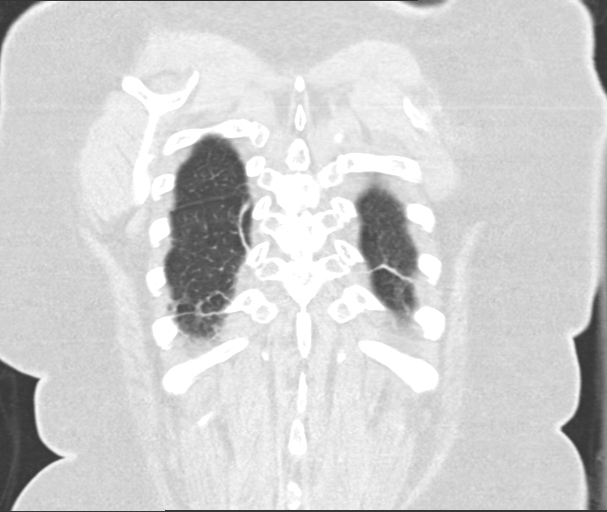
[im 55/136  lung]
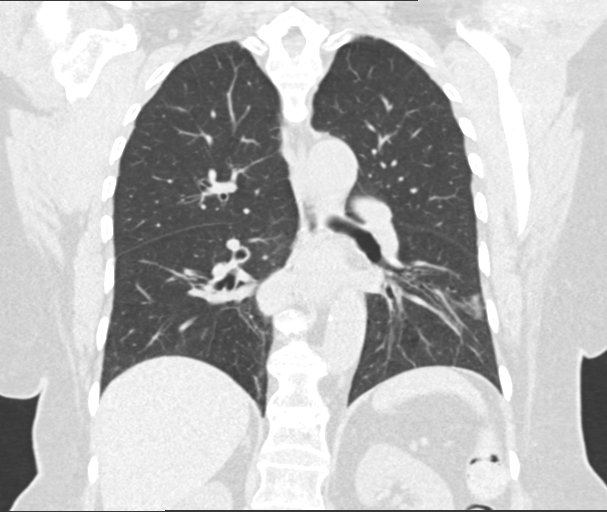
[im 82/136  lung]
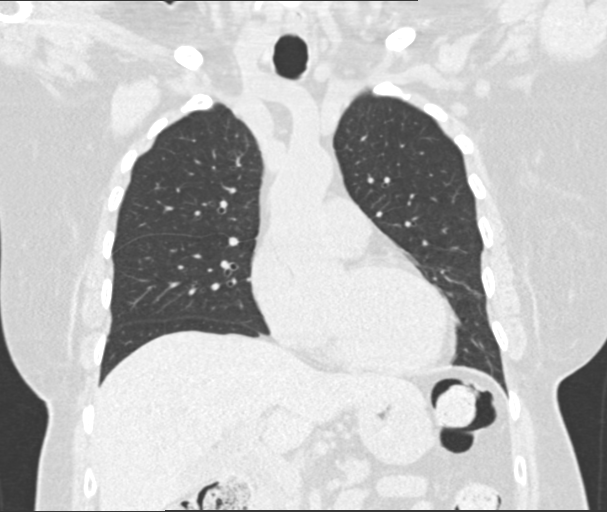

[15 of 36 positions shown; findings below may reference images not displayed]

FINDINGS: Cardiovascular: No significant vascular findings. Normal heart size.
No pericardial effusion.

Mediastinum/Nodes: No axillary or supraclavicular adenopathy. No
mediastinal or hilar adenopathy. No pericardial fluid. Esophagus
normal.

Lungs/Pleura: No pulmonary contusion. No pneumothorax. No pleural
fluid.

Upper Abdomen: Limited view of the liver, kidneys, pancreas are
unremarkable. Normal adrenal glands.

Musculoskeletal: No evidence of rib fracture. Chronic superior
endplate compression deformity at L1.
IMPRESSION: 1. No evidence of thoracic trauma.
2. No RIGHT rib fracture.

## 2022-08-07 IMAGING — CT CT HEAD W/O CM
3 series · 15 of 47 positions shown, 18 images · non-contrast
Comparison: None

CLINICAL DATA: Head trauma.  Fall.  Shoulder pain.

EXAM:
CT HEAD WITHOUT CONTRAST
TECHNIQUE: Contiguous axial images were obtained from the base of the skull
through the vertex without intravenous contrast.

[Series 2: head wo · axial · 0.39mm/px · z∈[+1334,+1460]mm · 9 of 30 slices shown, 12 images]
[im 3/30  brain]
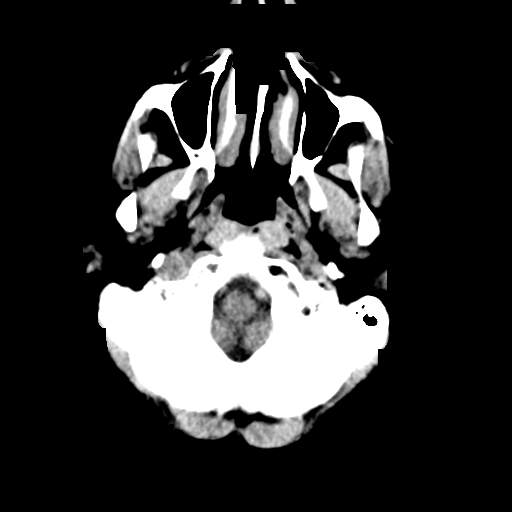
[im 3/30  bone]
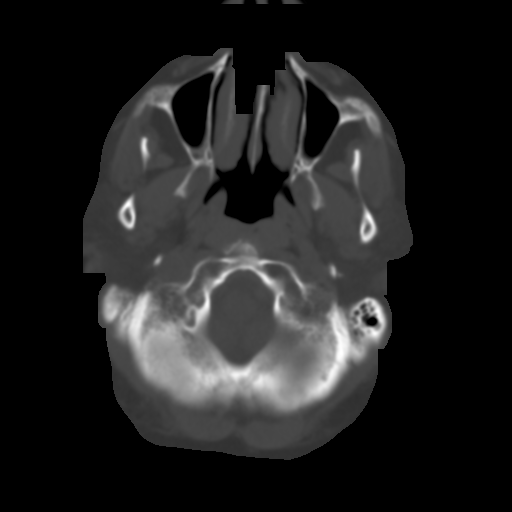
[im 6/30  brain]
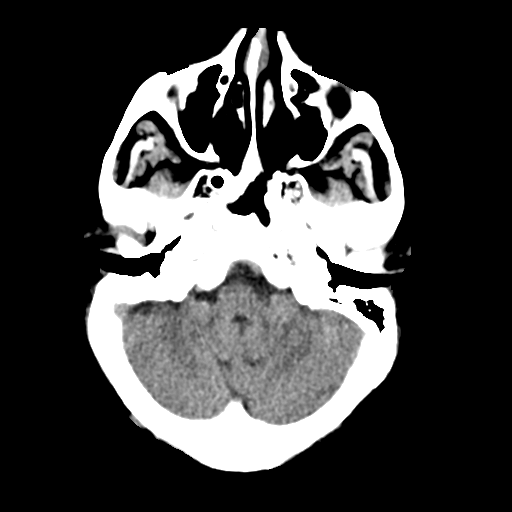
[im 9/30  brain]
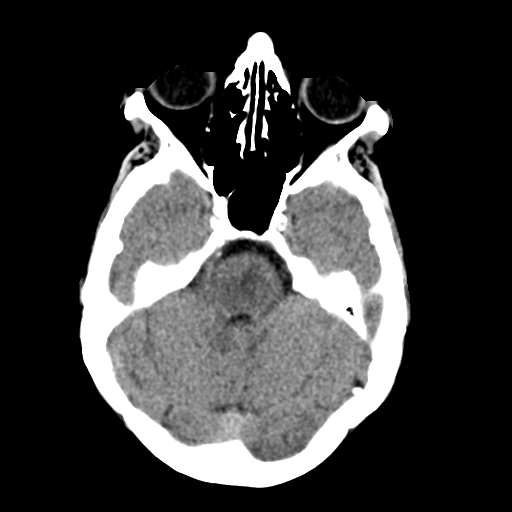
[im 12/30  brain]
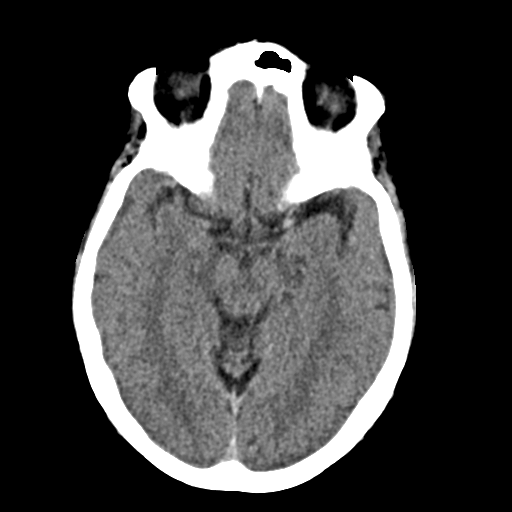
[im 16/30  brain]
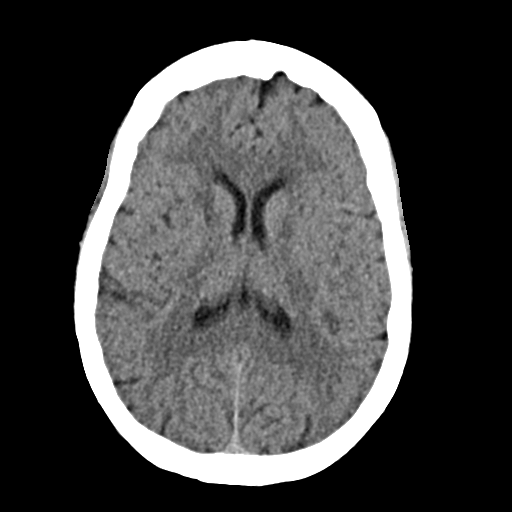
[im 16/30  bone]
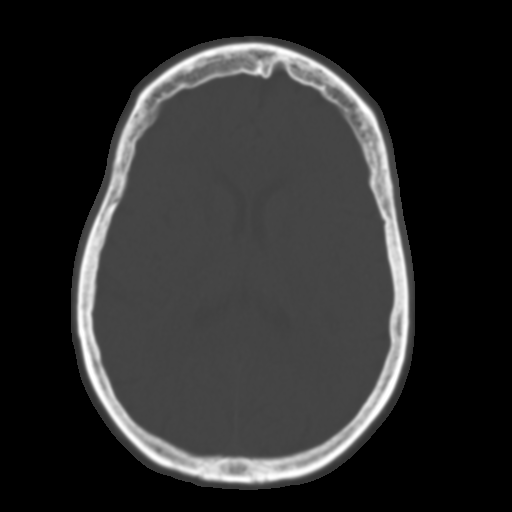
[im 19/30  brain]
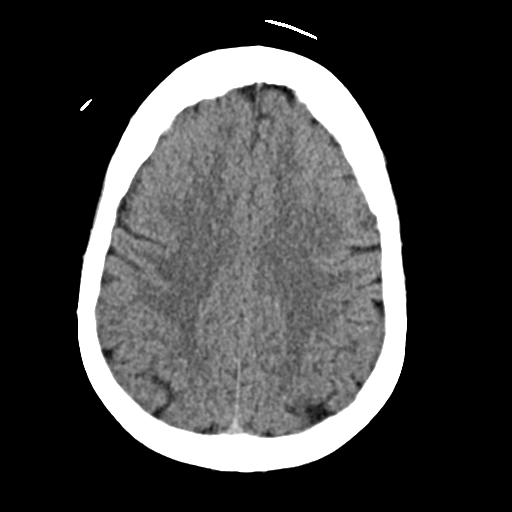
[im 22/30  brain]
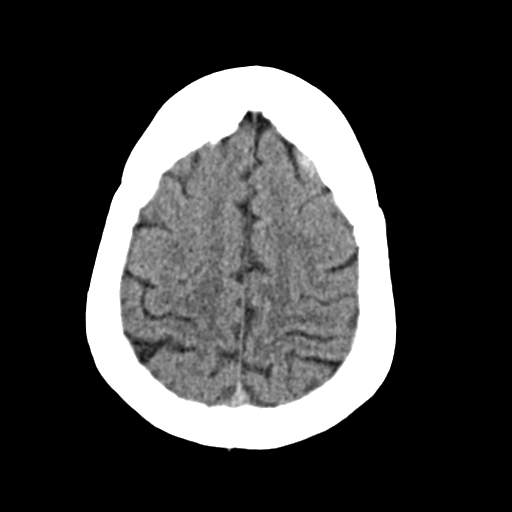
[im 25/30  brain]
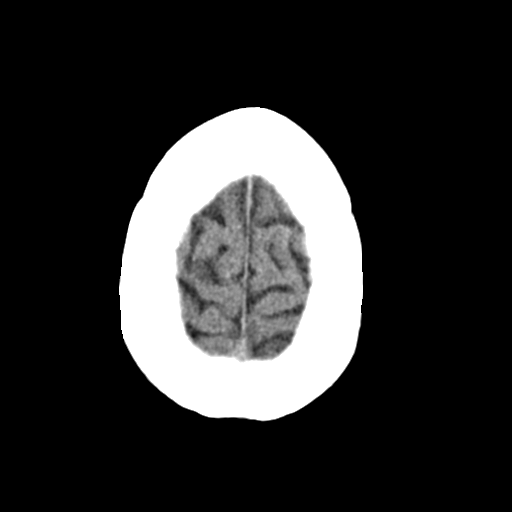
[im 28/30  brain]
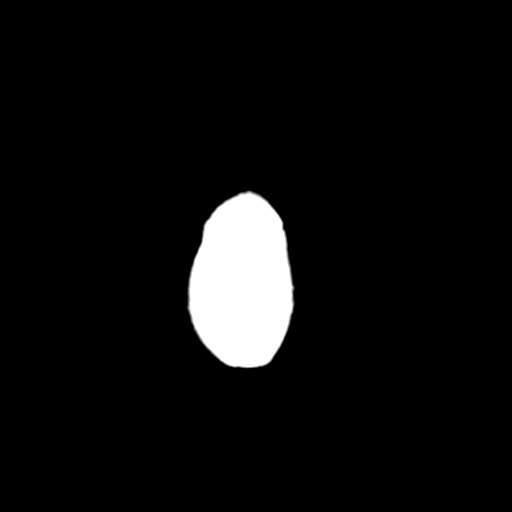
[im 28/30  bone]
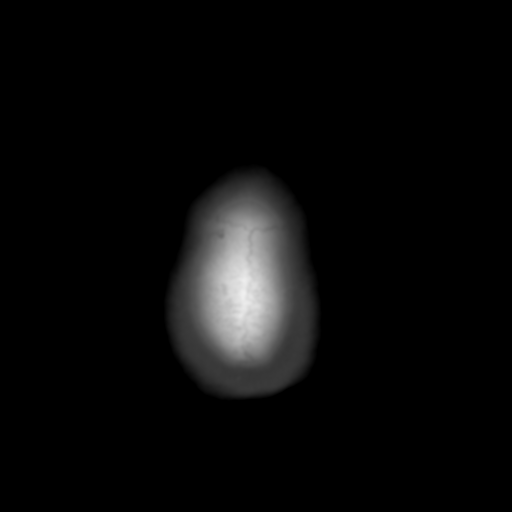

[Series 4: coronal soft · coronal · 0.29mm/px · 3 of 65 slices shown]
[im 22/65  brain]
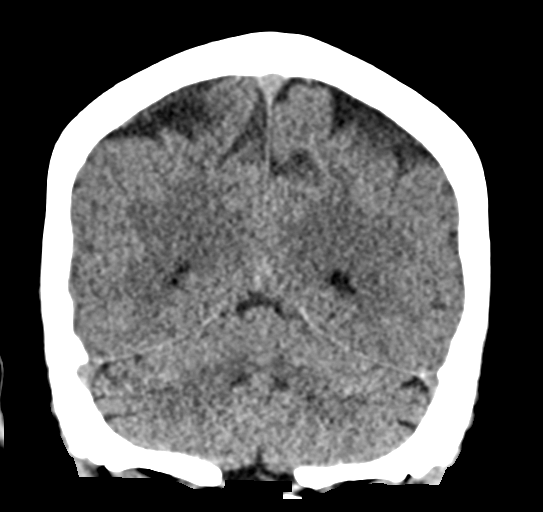
[im 29/65  brain]
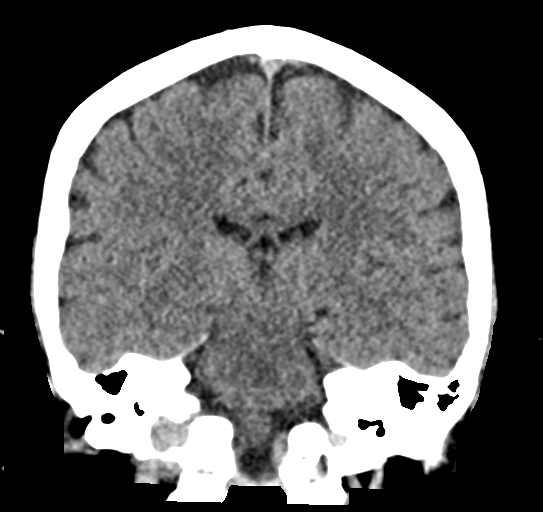
[im 36/65  brain]
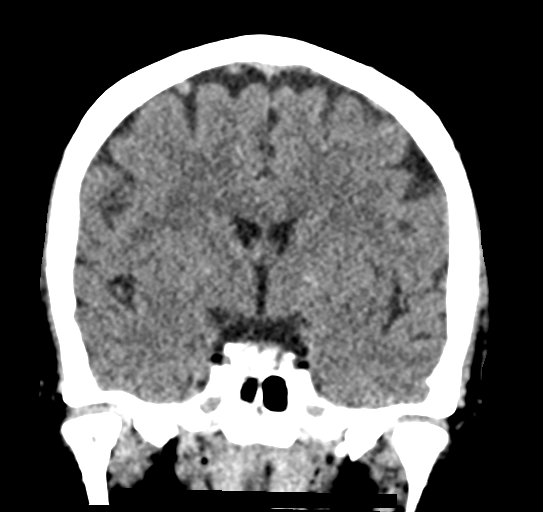

[Series 5: sag soft · sagittal · 0.29mm/px · 3 of 53 slices shown]
[im 18/53  brain]
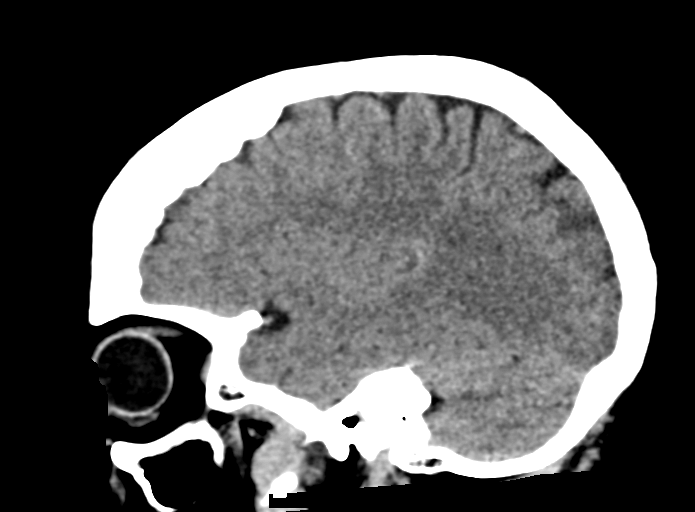
[im 27/53  brain]
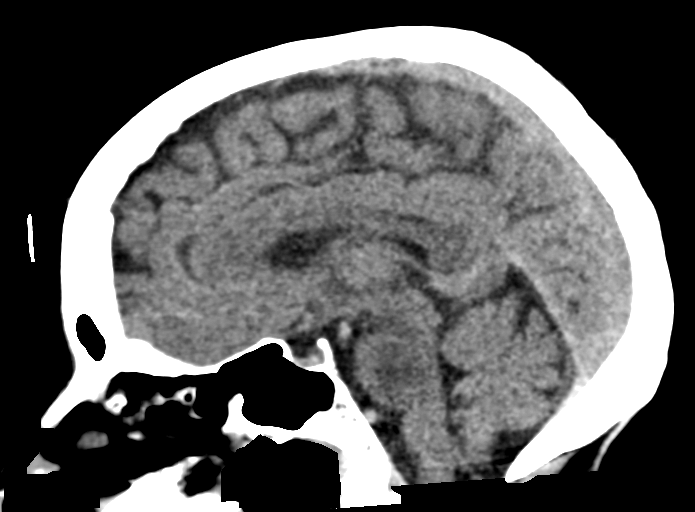
[im 35/53  brain]
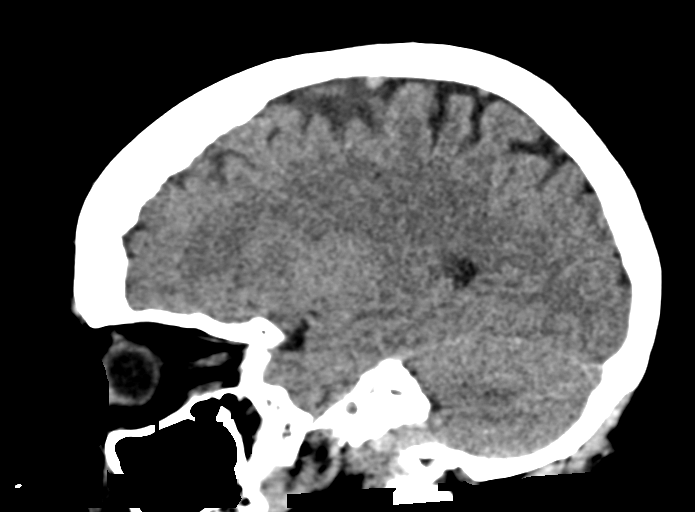

[15 of 47 positions shown; findings below may reference images not displayed]

FINDINGS: Brain: No acute intracranial hemorrhage. No focal mass lesion. No CT
evidence of acute infarction. No midline shift or mass effect. No
hydrocephalus. Basilar cisterns are patent.

Minimal periventricular and subcortical white matter hypodensities.
Minimal generalized cortical atrophy.

Vascular: No hyperdense vessel or unexpected calcification.

Skull: Normal. Negative for fracture or focal lesion.

Sinuses/Orbits: Paranasal sinuses and mastoid air cells are clear.
Orbits are clear.

Other: None.
IMPRESSION: No acute intracranial findings.  No evidence trauma.

## 2022-11-23 ENCOUNTER — Encounter: Payer: Self-pay | Admitting: Family Medicine

## 2022-11-23 ENCOUNTER — Ambulatory Visit: Payer: Self-pay

## 2022-11-23 ENCOUNTER — Ambulatory Visit (INDEPENDENT_AMBULATORY_CARE_PROVIDER_SITE_OTHER): Payer: Medicare Other | Admitting: Family Medicine

## 2022-11-23 VITALS — BP 120/60 | Ht 62.0 in | Wt 156.0 lb

## 2022-11-23 DIAGNOSIS — M778 Other enthesopathies, not elsewhere classified: Secondary | ICD-10-CM

## 2022-11-23 DIAGNOSIS — M19019 Primary osteoarthritis, unspecified shoulder: Secondary | ICD-10-CM | POA: Insufficient documentation

## 2022-11-23 MED ORDER — TRIAMCINOLONE ACETONIDE 40 MG/ML IJ SUSP
40.0000 mg | Freq: Once | INTRAMUSCULAR | Status: AC
  Administered 2022-11-23: 40 mg via INTRA_ARTICULAR

## 2022-11-23 NOTE — Assessment & Plan Note (Signed)
Acute on chronic in nature.  Has limitations in her external rotation as well as abduction.  Has a history of rotator cuff surgery from about a year ago. -Counseled on home exercise therapy and supportive care. - injection today  - could consider PT or further imaging.

## 2022-11-23 NOTE — Progress Notes (Signed)
Gloria Patterson - 71 y.o. female MRN [id number]  Date of birth: February 17, 1951  SUBJECTIVE:  Including CC & ROS.  No chief complaint on file.   Gloria Patterson is a 71 y.o. female that is in with acute on chronic left shoulder pain.  She reports having pain and swelling here.  Reports a history of rotator cuff surgery.  Denies any injury or inciting event.  Has tried completed physical therapy after the surgery..   Review of Systems See HPI   HISTORY: Past Medical, Surgical, Social, and Family History Reviewed & Updated per EMR.   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  Patient not sure why she was given Phenobarital,   " before 1978"   Urgency of urination    Vaginal vault prolapse    ANTERIOR    Past Surgical History:  Procedure Laterality Date   ANTERIOR AND POSTERIOR REPAIR N/A 03/10/2013   Procedure: ANTERIOR (CYSTOCELE) AND POSTERIOR REPAIR (RECTOCELE);  Surgeon: Kathi Ludwig, MD;  Location: [facility];  Service: Urology;  Laterality: N/A;   [employer] UPHOLD LITE ANTERIOR VAULT REPAIR  POSSIBLE OP WITH OBSERVATION POSSIBLE FLOOR BED  Uphold LITE w/[employer] number [id number] qty 2 Xenform soft tissue repair matrix 6x7 cm [id number]    BREAST REDUCTION SURGERY     BREAST SURGERY     B/L removal of benign cysts   COLONOSCOPY W/  POLYPECTOMY     CYSTOCELE REPAIR N/A 03/10/2013   Procedure:  Anterior vaginal vault prolapse repair, with [employer] uphold light sacrospinous fixation using U. device and mesh repair with [name] plication   ;  Surgeon: Kathi Ludwig, MD;  Location: [facility];  Service: Urology;  Laterality: N/A;   ESOPHAGOGASTRODUODENOSCOPY     TOTAL KNEE ARTHROPLASTY Left 04/08/2013   Procedure: TOTAL KNEE ARTHROPLASTY;  Surgeon: Cammy Copa, MD;  Location: [facility];  Service: Orthopedics;  Laterality: Left;  Left total knee arthroplasty   TOTAL KNEE ARTHROPLASTY Right 05/31/2017   Procedure: RIGHT TOTAL KNEE ARTHROPLASTY;  Surgeon: Cammy Copa, MD;  Location: [facility] OR;  Service: Orthopedics;  Laterality: Right;   TUBAL LIGATION       PHYSICAL EXAM:  VS: BP 120/60 (BP Location: Right Arm, Patient Position: Sitting)   Ht 5\' 2"  (1.575 m)   Wt 156 lb (70.8 kg)   BMI 28.53 kg/m  Physical Exam Gen: NAD, alert, cooperative with exam, well-appearing MSK:  Neurovascularly intact     Aspiration/Injection Procedure Note Gloria Patterson [birth year]  Procedure: Injection Indications: left shoulder pain  Procedure Details Consent: Risks of procedure as well as the alternatives and risks of each were explained to the (patient/caregiver).  Consent for procedure obtained. Time Out: Verified patient identification, verified procedure, site/side was marked, verified correct patient position, special equipment/implants available, medications/allergies/relevent history reviewed, required imaging and test results available.  Performed.  The area was cleaned with iodine and alcohol swabs.    The left glenohumeral joint was injected using 1 cc of 1% lidocaine on a 22-gauge 3-1/2 inch needle.  The syringe was switched to mixture containing 3 cc of 1% lidocaine, 1 cc's of 40 mg Kenalog and 4 cc's of 0.25% bupivacaine was injected.  Ultrasound was used. Images were obtained in  short views showing the injection.     A sterile dressing was applied.  Patient did tolerate procedure well.     ASSESSMENT & PLAN:   Capsulitis of left shoulder Acute on chronic in nature.  Has limitations in her external rotation as well as abduction.  Has a history of rotator cuff surgery from about a year ago. -Counseled on home exercise therapy and supportive care. - injection today  - could consider PT or further imaging.

## 2022-11-23 NOTE — Patient Instructions (Signed)
Nice to meet you Please use heat before exercise and ice after  Please try the exercises   Please send me a message in MyChart with any questions or updates.  Please see me back in 4 weeks.   --Dr. Jordan Likes

## 2022-12-26 ENCOUNTER — Ambulatory Visit (INDEPENDENT_AMBULATORY_CARE_PROVIDER_SITE_OTHER): Payer: Medicare HMO | Admitting: Family Medicine

## 2022-12-26 ENCOUNTER — Encounter: Payer: Self-pay | Admitting: Family Medicine

## 2022-12-26 VITALS — BP 124/64 | Ht 62.0 in | Wt 158.0 lb

## 2022-12-26 DIAGNOSIS — M19012 Primary osteoarthritis, left shoulder: Secondary | ICD-10-CM | POA: Diagnosis not present

## 2022-12-26 NOTE — Assessment & Plan Note (Signed)
Doing well since the injection. Having normal range of motion  - counseled on home exercise therapy and supportive care - could consider imaging or physical therapy

## 2022-12-26 NOTE — Progress Notes (Signed)
  Gloria Patterson - 72 y.o. female MRN [id number]  Date of birth: 04/07/1951  SUBJECTIVE:  Including CC & ROS.  No chief complaint on file.   Gloria Patterson is a 72 y.o. female that is following up for left shoulder pain.  She reports normal range of motion and her pain has improved.  She does get an intermittent type pain but not as severe as it once was.   Review of Systems See HPI   HISTORY: Past Medical, Surgical, Social, and Family History Reviewed & Updated per EMR.   Pertinent Historical Findings include:  Past Medical History:  Diagnosis Date   Arthritis    knee, shouder   Asthma    MILD   Chronic cough    Constipation    Diabetes mellitus without complication (HCC)    Type II   Frequency of urination    GERD (gastroesophageal reflux disease)    Hepatitis    Hepatitis C treated    History of hepatitis DX 1973  SECONDARY TO DRUG ABUSE--  UNKNOWN TYPE PER PT--  WAS TX'D    NO ISSURES OR S & S SINCE   History of heroin abuse ([place])    RECOVERING HEROIN AND COCAINE ADDICT   History of kidney stones    passed   Hypertension    Nocturia    Seizures ([place])    due to Phenobarbital.  Patient not sure why she was given Phenobarital,   " before 1978"   Urgency of urination    Vaginal vault prolapse    ANTERIOR    Past Surgical History:  Procedure Laterality Date   ANTERIOR AND POSTERIOR REPAIR N/A 03/10/2013   Procedure: ANTERIOR (CYSTOCELE) AND POSTERIOR REPAIR (RECTOCELE);  Surgeon: Ailene Rud, MD;  Location: [facility];  Service: Urology;  Laterality: N/A;   [employer] UPHOLD LITE ANTERIOR VAULT REPAIR  POSSIBLE OP WITH OBSERVATION POSSIBLE FLOOR BED  Uphold LITE w/[employer] number [id number] qty 2 Xenform soft tissue repair matrix 6x7 cm [id number]    BREAST REDUCTION SURGERY     BREAST SURGERY     B/L removal of benign cysts   COLONOSCOPY W/ POLYPECTOMY     CYSTOCELE REPAIR N/A 03/10/2013   Procedure:   Anterior vaginal vault prolapse repair, with [employer] uphold light sacrospinous fixation using U. device and mesh repair with Claiborne Billings plication   ;  Surgeon: Ailene Rud, MD;  Location: [facility];  Service: Urology;  Laterality: N/A;   ESOPHAGOGASTRODUODENOSCOPY     TOTAL KNEE ARTHROPLASTY Left 04/08/2013   Procedure: TOTAL KNEE ARTHROPLASTY;  Surgeon: Meredith Pel, MD;  Location: [place];  Service: Orthopedics;  Laterality: Left;  Left total knee arthroplasty   TOTAL KNEE ARTHROPLASTY Right 05/31/2017   Procedure: RIGHT TOTAL KNEE ARTHROPLASTY;  Surgeon: Meredith Pel, MD;  Location: [place];  Service: Orthopedics;  Laterality: Right;   TUBAL LIGATION       PHYSICAL EXAM:  VS: BP 124/64   Ht 5\' 2"  (1.575 m)   Wt 158 lb (71.7 kg)   BMI 28.90 kg/m  Physical Exam Gen: NAD, alert, cooperative with exam, well-appearing MSK:  Neurovascularly intact       ASSESSMENT & PLAN:   OA (osteoarthritis) of shoulder Doing well since the injection. Having normal range of motion  - counseled on home exercise therapy and supportive care - could consider imaging or physical therapy

## 2023-01-23 ENCOUNTER — Encounter: Payer: Self-pay | Admitting: Family Medicine

## 2023-01-23 ENCOUNTER — Ambulatory Visit (INDEPENDENT_AMBULATORY_CARE_PROVIDER_SITE_OTHER): Payer: Medicare HMO | Admitting: Family Medicine

## 2023-01-23 VITALS — BP 120/64 | Ht 62.0 in | Wt 158.0 lb

## 2023-01-23 DIAGNOSIS — M19012 Primary osteoarthritis, left shoulder: Secondary | ICD-10-CM

## 2023-01-23 MED ORDER — NAPROXEN 500 MG PO TABS: 500.0000 mg | ORAL_TABLET | Freq: Two times a day (BID) | ORAL | 1 refills | Status: DC | PRN

## 2023-01-23 NOTE — Assessment & Plan Note (Signed)
Acute on chronic in nature.  Degenerative changes appreciated on previous imaging.  Did well with the previous glenohumeral injection.  She has tried physical therapy in the past and reports she can do it at home at this time. -Counseled on home exercise therapy and supportive care. -Provided naproxen.  She reports not taking Xarelto at this time. -Counseled on compression. -Could consider suprascapular nerve block or further imaging.

## 2023-01-23 NOTE — Progress Notes (Signed)
Gloria Patterson - 72 y.o. female MRN [id number]  Date of birth: 1951/10/06  SUBJECTIVE:  Including CC & ROS.  No chief complaint on file.   Gloria Patterson is a 72 y.o. female that is following up for left shoulder pain.  She did well for period of time since the injection.  Now she is having some mild pain returned.  No injury or inciting event.  Similar to her previous pain.    Review of Systems See HPI   HISTORY: Past Medical, Surgical, Social, and Family History Reviewed & Updated per EMR.   Pertinent Historical Findings include:  Past Medical History:  Diagnosis Date   Arthritis    knee, shouder   Asthma    MILD   Chronic cough    Constipation    Diabetes mellitus without complication (HCC)    Type II   Frequency of urination    GERD (gastroesophageal reflux disease)    Hepatitis    Hepatitis C treated    History of hepatitis DX 1973  SECONDARY TO DRUG ABUSE--  UNKNOWN TYPE PER PT--  WAS TX'D    NO ISSURES OR S & S SINCE   History of heroin abuse ([place])    RECOVERING HEROIN AND COCAINE ADDICT   History of kidney stones    passed   Hypertension    Nocturia    Seizures ([place])    due to Phenobarbital.  Patient not sure why she was given Phenobarital,   " before 1978"   Urgency of urination    Vaginal vault prolapse    ANTERIOR    Past Surgical History:  Procedure Laterality Date   ANTERIOR AND POSTERIOR REPAIR N/A 03/10/2013   Procedure: ANTERIOR (CYSTOCELE) AND POSTERIOR REPAIR (RECTOCELE);  Surgeon: Ailene Rud, MD;  Location[facility];  Service: Urology;  Laterality: N/A;   [employer] UPHOLD LITE ANTERIOR VAULT REPAIR  POSSIBLE OP WITH OBSERVATION POSSIBLE FLOOR BED  Uphold LITE w/[employer] number [id number] qty 2 Xenform soft tissue repair matrix 6x7 cm [id number]    BREAST REDUCTION SURGERY     BREAST SURGERY     B/L removal of benign cysts   COLONOSCOPY W/ POLYPECTOMY     CYSTOCELE REPAIR N/A  03/10/2013   Procedure:  Anterior vaginal vault prolapse repair, with [employer] uphold light sacrospinous fixation using U. device and mesh repair with Claiborne Billings plication   ;  Surgeon: Ailene Rud, MD;  Location: [facility];  Service: Urology;  Laterality: N/A;   ESOPHAGOGASTRODUODENOSCOPY     TOTAL KNEE ARTHROPLASTY Left 04/08/2013   Procedure: TOTAL KNEE ARTHROPLASTY;  Surgeon: Meredith Pel, MD;  Location: [place];  Service: Orthopedics;  Laterality: Left;  Left total knee arthroplasty   TOTAL KNEE ARTHROPLASTY Right 05/31/2017   Procedure: RIGHT TOTAL KNEE ARTHROPLASTY;  Surgeon: Meredith Pel, MD;  Location: [place];  Service: Orthopedics;  Laterality: Right;   TUBAL LIGATION       PHYSICAL EXAM:  VS: BP 120/64   Ht 5\' 2"  (1.575 m)   Wt 158 lb (71.7 kg)   BMI 28.90 kg/m  Physical Exam Gen: NAD, alert, cooperative with exam, well-appearing MSK:  Neurovascularly intact       ASSESSMENT & PLAN:   OA (osteoarthritis) of shoulder Acute on chronic in nature.  Degenerative changes appreciated on previous imaging.  Did well with the previous glenohumeral injection.  She has tried physical therapy in the past and reports she  can do it at home at this time. -Counseled on home exercise therapy and supportive care. -Provided naproxen.  She reports not taking Xarelto at this time. -Counseled on compression. -Could consider suprascapular nerve block or further imaging.

## 2023-01-23 NOTE — Patient Instructions (Signed)
Good to see you Please alternate heat and ice   You can work on the range of motion movements  Please do not take the naproxen if you are taking the blood thinner Please send me a message in [place] with any questions or updates.  Please see me back in 4-6 weeks.   --Dr. Raeford Razor

## 2023-02-21 ENCOUNTER — Ambulatory Visit: Payer: Medicare HMO | Admitting: Family Medicine

## 2023-03-15 ENCOUNTER — Other Ambulatory Visit: Payer: Self-pay

## 2023-03-15 ENCOUNTER — Encounter: Payer: Self-pay | Admitting: Family Medicine

## 2023-03-15 ENCOUNTER — Ambulatory Visit (INDEPENDENT_AMBULATORY_CARE_PROVIDER_SITE_OTHER): Payer: Medicare HMO | Admitting: Family Medicine

## 2023-03-15 ENCOUNTER — Other Ambulatory Visit: Payer: Self-pay | Admitting: Family Medicine

## 2023-03-15 VITALS — BP 110/60 | Ht 62.0 in | Wt 168.0 lb

## 2023-03-15 DIAGNOSIS — M19012 Primary osteoarthritis, left shoulder: Secondary | ICD-10-CM

## 2023-03-15 MED ORDER — TRIAMCINOLONE ACETONIDE 40 MG/ML IJ SUSP
40.0000 mg | Freq: Once | INTRAMUSCULAR | Status: AC
  Administered 2023-03-15: 40 mg via INTRA_ARTICULAR

## 2023-03-15 NOTE — Progress Notes (Signed)
Gloria Patterson - 72 y.o. female MRN [id number]  Date of birth: 02-Dec-1951  SUBJECTIVE:  Including CC & ROS.  No chief complaint on file.   Gloria Patterson is a 72 y.o. female that is  presenting with acute worsening of her left shoulder. Similar to her previous pain. Felt pain after picking up her grandchild..    Review of Systems See HPI   HISTORY: Past Medical, Surgical, Social, and Family History Reviewed & Updated per EMR.   Pertinent Historical Findings include:  Past Medical History:  Diagnosis Date   Arthritis    knee, shouder   Asthma    MILD   Chronic cough    Constipation    Diabetes mellitus without complication (HCC)    Type II   Frequency of urination    GERD (gastroesophageal reflux disease)    Hepatitis    Hepatitis C treated    History of hepatitis DX 1973  SECONDARY TO DRUG ABUSE--  UNKNOWN TYPE PER PT--  WAS TX'D    NO ISSURES OR S & S SINCE   History of heroin abuse ([place])    RECOVERING HEROIN AND COCAINE ADDICT   History of kidney stones    passed   Hypertension    Nocturia    Seizures ([place])    due to Phenobarbital.  Patient not sure why she was given Phenobarital,   " before 1978"   Urgency of urination    Vaginal vault prolapse    ANTERIOR    Past Surgical History:  Procedure Laterality Date   ANTERIOR AND POSTERIOR REPAIR N/A 03/10/2013   Procedure: ANTERIOR (CYSTOCELE) AND POSTERIOR REPAIR (RECTOCELE);  Surgeon: Ailene Rud, MD;  Location: [facility];  Service: Urology;  Laterality: N/A;   [employer] UPHOLD LITE ANTERIOR VAULT REPAIR  POSSIBLE OP WITH OBSERVATION POSSIBLE FLOOR BED  Uphold LITE w/[employer] number [id number] qty 2 Xenform soft tissue repair matrix 6x7 cm [id number]    BREAST REDUCTION SURGERY     BREAST SURGERY     B/L removal of benign cysts   COLONOSCOPY W/ POLYPECTOMY     CYSTOCELE REPAIR N/A 03/10/2013   Procedure:  Anterior vaginal vault prolapse repair, with  [employer] uphold light sacrospinous fixation using U. device and mesh repair with Claiborne Billings plication   ;  Surgeon: Ailene Rud, MD;  Location: [facility];  Service: Urology;  Laterality: N/A;   ESOPHAGOGASTRODUODENOSCOPY     TOTAL KNEE ARTHROPLASTY Left 04/08/2013   Procedure: TOTAL KNEE ARTHROPLASTY;  Surgeon: Meredith Pel, MD;  Location: [place];  Service: Orthopedics;  Laterality: Left;  Left total knee arthroplasty   TOTAL KNEE ARTHROPLASTY Right 05/31/2017   Procedure: RIGHT TOTAL KNEE ARTHROPLASTY;  Surgeon: Meredith Pel, MD;  Location: [place];  Service: Orthopedics;  Laterality: Right;   TUBAL LIGATION       PHYSICAL EXAM:  VS: BP 110/60   Ht 5\' 2"  (1.575 m)   Wt 168 lb (76.2 kg)   BMI 30.73 kg/m  Physical Exam Gen: NAD, alert, cooperative with exam, well-appearing MSK:  Neurovascularly intact     Aspiration/Injection Procedure Note Gloria Patterson 09/16/1951  Procedure: Injection Indications: left shoulder pain  Procedure Details Consent: Risks of procedure as well as the alternatives and risks of each were explained to the (patient/caregiver).  Consent for procedure obtained. Time Out: Verified patient identification, verified procedure, site/side was marked, verified correct patient position, special equipment/implants available, medications/allergies/relevent history reviewed,  required imaging and test results available.  Performed.  The area was cleaned with iodine and alcohol swabs.    The left glenohumeral joint was injected using 4 cc of 1% lidocaine and 0.4 cc of 8.4% sodium bicarbonate on a 22-gauge 3-1/2 inch needle.  The syringe was switched to mixture containing 1 cc's of 40 mg Kenalog and 4 cc's of 0.25% bupivacaine was injected.  Ultrasound was used. Images were obtained in short views showing the injection.     A sterile dressing was applied.  Patient did tolerate procedure well.     ASSESSMENT & PLAN:   OA  (osteoarthritis) of shoulder Acutely occurring.  Having pain that is similar to her previous type pain.  Does have degenerative changes appreciated on imaging. -Counseled on home exercise therapy and supportive care. -Injection today. -Could consider physical therapy or nerve block.

## 2023-03-15 NOTE — Assessment & Plan Note (Signed)
Acutely occurring.  Having pain that is similar to her previous type pain.  Does have degenerative changes appreciated on imaging. -Counseled on home exercise therapy and supportive care. -Injection today. -Could consider physical therapy or nerve block.

## 2023-03-15 NOTE — Patient Instructions (Signed)
Good to see you Please alternate heat and ice as needed  Please continue the exercises   Please send me a message in MyChart with any questions or updates.  Please see me back in 6-8 weeks as needed if needed.   --Dr. Raeford Razor

## 2023-04-05 ENCOUNTER — Encounter: Payer: Self-pay | Admitting: *Deleted

## 2023-05-09 ENCOUNTER — Other Ambulatory Visit: Payer: Self-pay | Admitting: Family Medicine

## 2023-06-05 ENCOUNTER — Encounter: Payer: Self-pay | Admitting: Family Medicine

## 2023-06-05 ENCOUNTER — Ambulatory Visit (INDEPENDENT_AMBULATORY_CARE_PROVIDER_SITE_OTHER): Payer: Medicare HMO | Admitting: Family Medicine

## 2023-06-05 VITALS — BP 120/68 | Ht 62.0 in | Wt 168.0 lb

## 2023-06-05 DIAGNOSIS — M7542 Impingement syndrome of left shoulder: Secondary | ICD-10-CM | POA: Diagnosis not present

## 2023-06-05 MED ORDER — METHYLPREDNISOLONE ACETATE 40 MG/ML IJ SUSP
40.0000 mg | Freq: Once | INTRAMUSCULAR | Status: AC
  Administered 2023-06-05: 40 mg via INTRA_ARTICULAR

## 2023-06-05 NOTE — Patient Instructions (Signed)
You have rotator cuff impingement Try to avoid painful activities (overhead activities, lifting with extended arm) as much as possible. Aleve 2 tabs twice a day with food OR ibuprofen 3 tabs three times a day with food for pain and inflammation as needed. Can take tylenol in addition to this. Subacromial injection may be beneficial to help with pain and to decrease inflammation - you were given this today. Start physical therapy with transition to home exercise program. Do home exercise program with theraband and scapular stabilization exercises daily 3 sets of 10 once a day. If not improving at follow-up we will consider MRI Follow up with me in 1 month.

## 2023-06-06 ENCOUNTER — Encounter: Payer: Self-pay | Admitting: Family Medicine

## 2023-06-06 NOTE — Progress Notes (Signed)
PCP: Yolanda Manges, DO  Subjective:   HPI: Patient is a 72 y.o. female here for left shoulder pain.  Patient reports for the past month she's had worsening left shoulder pain. + night pain. Worse raising arm overhead. No help with any medication to date. Glenohumeral injection last visit only a little benefit.  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  Patient not sure why she was given Phenobarital,   " before 1978"   Urgency of urination    Vaginal vault prolapse    ANTERIOR    Current Outpatient Medications on File Prior to Visit  Medication Sig Dispense Refill   albuterol (PROVENTIL HFA) 108 (90 BASE) MCG/ACT inhaler Inhale 2 puffs into the lungs every 4 (four) hours as needed for wheezing or shortness of breath.      amLODipine (NORVASC) 5 MG tablet Take 1 tablet by mouth daily.     atorvastatin (LIPITOR) 20 MG tablet Take 20 mg by mouth at bedtime.     atorvastatin (LIPITOR) 20 MG tablet Take by mouth.     bisacodyl (DULCOLAX) 10 MG suppository Place 1 suppository (10 mg total) rectally daily as needed for moderate constipation. (Patient not taking: Reported on 07/23/2017) 12 suppository 0   cloNIDine (CATAPRES) 0.1 MG tablet Take 1 tablet (0.1 mg total) by mouth 2 (two) times daily. 60 tablet 11   famotidine (PEPCID) 40 MG tablet TAKE 1 TABLET BY MOUTH EVERY DAY AT NIGHT     fluticasone (FLOVENT HFA) 110 MCG/ACT inhaler Inhale into  the lungs.     HYDROmorphone (DILAUDID) 4 MG tablet Take 1 tablet (4 mg total) by mouth every 4 (four) hours as needed for severe pain. (Patient not taking: Reported on 07/23/2017) 30 tablet 0   Lidocaine (SALONPAS PAIN RELIEVING) 4 % PTCH Apply 1 patch to affected area(s) topically every 12 (twelve) hours. 15 patch 0   lisinopril (ZESTRIL) 40 MG tablet Take by mouth.     metFORMIN (GLUCOPHAGE) 500 MG tablet TAKE 1 TABLET BY MOUTH EVERY DAY WITH FOOD - OFFICE VISIT BEFORE NEXT REFILL  5   metFORMIN (GLUCOPHAGE) 500 MG tablet Take by mouth.     methadone (DOLOPHINE) 10 MG tablet Take by mouth.     methocarbamol (ROBAXIN) 500 MG tablet Take 1 tablet (500 mg total) by mouth every 6 (six) hours as needed for muscle spasms. (Patient not taking: Reported on 07/23/2017) 30 tablet 0   metoprolol succinate (TOPROL-XL) 25 MG 24 hr tablet Take 1 tablet by mouth daily.     Multiple Vitamin (MULTIVITAMIN WITH MINERALS) TABS tablet Take 1 tablet by mouth daily.     naproxen (NAPROSYN) 500 MG tablet TAKE 1 TABLET BY MOUTH TWICE A DAY AS NEEDED 60  tablet 1   Nutritional Supplements (ESTROVEN PO) Take 1 tablet by mouth daily.     omeprazole (PRILOSEC) 40 MG capsule Take 1 capsule (40 mg total) by mouth daily. 30 capsule 11   omeprazole (PRILOSEC) 40 MG capsule Take 1 tablet by mouth daily.     ondansetron (ZOFRAN) 4 MG/2ML SOLN injection Inject 2 mLs (4 mg total) into the vein every 6 (six) hours as needed for nausea or vomiting. (Patient not taking: Reported on 07/23/2017) 2 mL 0   polyethylene glycol (MIRALAX / GLYCOLAX) packet Take 17 g by mouth daily as needed for mild constipation.      polyethylene glycol powder (GLYCOLAX/MIRALAX) 17 GM/SCOOP powder Take by mouth.     traMADol (ULTRAM) 50 MG tablet Take by mouth.     triamcinolone cream (KENALOG) 0.1 % Apply 1 application topically daily as needed for itching or rash.  1   No current facility-administered medications on file prior to visit.    Past Surgical  History:  Procedure Laterality Date   ANTERIOR AND POSTERIOR REPAIR N/A 03/10/2013   Procedure: ANTERIOR (CYSTOCELE) AND POSTERIOR REPAIR (RECTOCELE);  Surgeon: Kathi Ludwig, MD;  Location: [facility];  Service: Urology;  Laterality: N/A;   [employer]IFIC UPHOLD LITE ANTERIOR VAULT REPAIR  POSSIBLE OP WITH OBSERVATION POSSIBLE FLOOR BED  Uphold LITE w/[employer] number [id number] qty 2 Xenform soft tissue repair matrix 6x7 cm [id number]    BREAST REDUCTION SURGERY     BREAST SURGERY     B/L removal of benign cysts   COLONOSCOPY W/ POLYPECTOMY     CYSTOCELE REPAIR N/A 03/10/2013   Procedure:  Anterior vaginal vault prolapse repair, with [employer] uphold light sacrospinous fixation using U. device and mesh repair with [name] plication   ;  Surgeon: Kathi Ludwig, MD;  Location: [facility];  Service: Urology;  Laterality: N/A;   ESOPHAGOGASTRODUODENOSCOPY     TOTAL KNEE ARTHROPLASTY Left 04/08/2013   Procedure: TOTAL KNEE ARTHROPLASTY;  Surgeon: Cammy Copa, MD;  Location: [facility];  Service: Orthopedics;  Laterality: Left;  Left total knee arthroplasty   TOTAL KNEE ARTHROPLASTY Right 05/31/2017   Procedure: RIGHT TOTAL KNEE ARTHROPLASTY;  Surgeon: Cammy Copa, MD;  Location: [facility] OR;  Service: Orthopedics;  Laterality: Right;   TUBAL LIGATION      Allergies  Allergen Reactions   Phenobarbital Other (See Comments)     ? SEIZURES ?  [ QUESTION IF SZs RESULT FROM PHENOBARB ? ]     BP 120/68 (BP Location: Right Arm, Patient Position: Sitting)   Ht 5\' 2"  (1.575 m)   Wt 168 lb (76.2 kg)   BMI 30.73 kg/m       No data to display              No data to display              Objective:  Physical Exam:  Gen: NAD, comfortable in exam room  Left shoulder: No swelling, ecchymoses.  No gross deformity. No TTP AC joint, biceps tendon. FROM with painful arc. Positive Hawkins, Neers. Negative  Yergasons. Strength 5/5 with empty can and resisted internal/external rotation.  Pain empty can > ER. NV intact distally.   Assessment & Plan:  1. Left shoulder pain - 2/2 rotator cuff impingement.  Subacromial injection given today.  Start physical therapy in a few days at earliest.  Aleve or ibuprofen if needed, tylenol.  F/u in 1 month.  Consider mri if not improving.  After informed written consent timeout was performed, patient was seated in chair in exam room. Left shoulder was prepped with alcohol swab and utilizing lateral approach with ultrasound guidance, patient's left subacromial space was injected with 3:1 lidocaine: depomedrol. Patient tolerated the procedure well without immediate complications.

## 2023-07-19 ENCOUNTER — Ambulatory Visit (INDEPENDENT_AMBULATORY_CARE_PROVIDER_SITE_OTHER): Payer: Medicare PPO | Admitting: Sports Medicine

## 2023-07-19 ENCOUNTER — Other Ambulatory Visit: Payer: Self-pay

## 2023-07-19 VITALS — BP 112/70 | Ht 62.0 in | Wt 163.0 lb

## 2023-07-19 DIAGNOSIS — M79642 Pain in left hand: Secondary | ICD-10-CM

## 2023-07-19 DIAGNOSIS — M180 Bilateral primary osteoarthritis of first carpometacarpal joints: Secondary | ICD-10-CM

## 2023-07-19 DIAGNOSIS — M79641 Pain in right hand: Secondary | ICD-10-CM | POA: Diagnosis not present

## 2023-07-19 NOTE — Progress Notes (Signed)
PCP: Yolanda Manges, DO  Subjective:   HPI: Patient is a 72 y.o. female here for evaluation of bilateral thumb/hand pain.  She reports that these issues have been ongoing for the past few years though worsened over the past 3 months.  She received an injection on 01/15/2023 into her [facility] joint which was helpful for roughly 1 month.  She has since been using a thumb brace and topical Voltaren gel without improvement.  She reports her left thumb being more painful than the right.  She reports intermittent swelling.  She denies pain in the elbow or the forearm.  She has been treated for left shoulder rotator cuff over the past year with home exercise program and a CSI as recent as this past June and reports improvement in her pain and range of motion.  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  Patient not sure why she was given Phenobarital,   " before 1978"   Urgency of urination    Vaginal vault prolapse    ANTERIOR    Current Outpatient Medications on File Prior to Visit  Medication Sig Dispense Refill   albuterol (PROVENTIL HFA) 108 (90 BASE) MCG/ACT inhaler Inhale 2 puffs into the lungs every 4 (four) hours as needed for wheezing or shortness of breath.      amLODipine (NORVASC) 5 MG tablet Take 1 tablet by mouth daily.     atorvastatin (LIPITOR) 20 MG tablet Take 20 mg by mouth at bedtime.     atorvastatin (LIPITOR) 20 MG tablet Take by mouth.      bisacodyl (DULCOLAX) 10 MG suppository Place 1 suppository (10 mg total) rectally daily as needed for moderate constipation. (Patient not taking: Reported on 07/23/2017) 12 suppository 0   cloNIDine (CATAPRES) 0.1 MG tablet Take 1 tablet (0.1 mg total) by mouth 2 (two) times daily. 60 tablet 11   famotidine (PEPCID) 40 MG tablet TAKE 1 TABLET BY MOUTH EVERY DAY AT NIGHT     fluticasone (FLOVENT HFA) 110 MCG/ACT inhaler Inhale into the lungs.     HYDROmorphone (DILAUDID) 4 MG tablet Take 1 tablet (4 mg total) by mouth every 4 (four) hours as needed for severe pain. (Patient not taking: Reported on 07/23/2017) 30 tablet 0   Lidocaine (SALONPAS PAIN RELIEVING) 4 % PTCH Apply 1 patch to affected area(s) topically every 12 (twelve) hours. 15 patch 0   lisinopril (ZESTRIL) 40 MG tablet Take by mouth.     metFORMIN (GLUCOPHAGE) 500 MG tablet TAKE 1 TABLET BY MOUTH EVERY DAY WITH FOOD - OFFICE VISIT BEFORE NEXT REFILL  5   metFORMIN (GLUCOPHAGE) 500 MG tablet Take by mouth.     methadone (DOLOPHINE) 10 MG tablet Take by mouth.  methocarbamol (ROBAXIN) 500 MG tablet Take 1 tablet (500 mg total) by mouth every 6 (six) hours as needed for muscle spasms. (Patient not taking: Reported on 07/23/2017) 30 tablet 0   metoprolol succinate (TOPROL-XL) 25 MG 24 hr tablet Take 1 tablet by mouth daily.     Multiple Vitamin (MULTIVITAMIN WITH MINERALS) TABS tablet Take 1 tablet by mouth daily.     naproxen (NAPROSYN) 500 MG tablet TAKE 1 TABLET BY MOUTH TWICE A DAY AS NEEDED 60 tablet 1   Nutritional Supplements (ESTROVEN PO) Take 1 tablet by mouth daily.     omeprazole (PRILOSEC) 40 MG capsule Take 1 capsule (40 mg total) by mouth daily. 30 capsule 11   omeprazole (PRILOSEC) 40 MG capsule Take 1 tablet by mouth daily.     ondansetron (ZOFRAN) 4 MG/2ML SOLN injection Inject 2 mLs (4 mg total) into the vein every 6 (six) hours as needed for nausea or vomiting. (Patient not taking: Reported on 07/23/2017) 2 mL 0   polyethylene  glycol (MIRALAX / GLYCOLAX) packet Take 17 g by mouth daily as needed for mild constipation.      polyethylene glycol powder (GLYCOLAX/MIRALAX) 17 GM/SCOOP powder Take by mouth.     traMADol (ULTRAM) 50 MG tablet Take by mouth.     triamcinolone cream (KENALOG) 0.1 % Apply 1 application topically daily as needed for itching or rash.  1   No current facility-administered medications on file prior to visit.   Past Surgical History:  Procedure Laterality Date   ANTERIOR AND POSTERIOR REPAIR N/A 03/10/2013   Procedure: ANTERIOR (CYSTOCELE) AND POSTERIOR REPAIR (RECTOCELE);  Surgeon: Kathi Ludwig, MD;  Location: [facility];  Service: Urology;  Laterality: N/A;   [employer] UPHOLD LITE ANTERIOR VAULT REPAIR  POSSIBLE OP WITH OBSERVATION POSSIBLE FLOOR BED  Uphold LITE w/[employer] number [id number] qty 2 Xenform soft tissue repair matrix 6x7 cm [id number]    BREAST REDUCTION SURGERY     BREAST SURGERY     B/L removal of benign cysts   COLONOSCOPY W/ POLYPECTOMY     CYSTOCELE REPAIR N/A 03/10/2013   Procedure:  Anterior vaginal vault prolapse repair, with [employer] uphold light sacrospinous fixation using U. device and mesh repair with [name] plication   ;  Surgeon: Kathi Ludwig, MD;  Location: [facility];  Service: Urology;  Laterality: N/A;   ESOPHAGOGASTRODUODENOSCOPY     TOTAL KNEE ARTHROPLASTY Left 04/08/2013   Procedure: TOTAL KNEE ARTHROPLASTY;  Surgeon: Cammy Copa, MD;  Location: [facility] OR;  Service: Orthopedics;  Laterality: Left;  Left total knee arthroplasty   TOTAL KNEE ARTHROPLASTY Right 05/31/2017   Procedure: RIGHT TOTAL KNEE ARTHROPLASTY;  Surgeon: Cammy Copa, MD;  Location: [facility] OR;  Service: Orthopedics;  Laterality: Right;   TUBAL LIGATION      Allergies  Allergen Reactions   Phenobarbital Other (See Comments)     ? SEIZURES ?  [ QUESTION IF SZs RESULT FROM PHENOBARB ? ]    BP 112/70   Ht  5\' 2"  (1.575 m)   Wt 163 lb (73.9 kg)   BMI 29.81 kg/m      Objective:  Physical Exam:  Gen: NAD, comfortable in exam room Resp: Unlabored breathing, symmetric chest rise  Right Wrist/Hand MSK: No edema, erythema, ecchymosis.  Bony protuberance at [facility] joint on the dorsal aspect. Full range of motion. Tender palpation overlying CMC joint.  Nontender palpation of first metacarpal.  First MCP nontender. + CMC  Grind Negative Finkelstein's. Neurovascular intact distally.  Left Wrist/Hand MSK: Mild edema overlying first CMC, no erythema or ecchymosis.  Bony protuberance at [facility] joint on the dorsal aspect. Full range of motion. Tender to palpation overlying CMC joint.  Nontender first metacarpal.  First MCP nontender.   + CMC Grind Negative Finkelstein's.   Neurovascular intact distally.    Assessment & Plan:  1. Bilateral CMC Joint Osteoarthritis Patient has failed conservative measures including bracing and anti-inflammatories. She has tried a prior [facility] joint injection though only received ~1 month of relief. She was amenable to repeating this injection today to see if it would provide longer lasting relief. This was performed as described below. After care instructions reviewed. She would like to return at a later date for injection of the right [facility] joint. F/u PRN.     Ultrasound guided injection of the Left CMC joint The procedure below was performed with ultrasound guidance, with permanent recording and reporting.  PROCEDURAL PAUSE:   A procedural pause was conducted to verify correct patient identity, procedure to be performed and as applicable, correct side and site, and correct patient position.  INFORMED CONSENT: I discussed the risks, possible benefits, and alternatives to injection.  Specifically the risk of skin hypopigmentation was discussed with the patient.  Following denial of allergy and review of potential side effects and complications (including but not  necessarily limited to lung collapse, infection, allergic reaction, local tissue breakdown, systemic effects of corticosteroids, elevation of blood glucose, injury to soft tissue and/or nerves and seizure), all questions were answered and consent was given to proceed.  Patient verbalized understanding.   PROCEDURE DETAILS:   Prior to the procedure, the left [facility] joint was examined with a high-frequency hockey stick transducer to determine the optimal needle path and visualize the joint both in short and long axis, with care to avoid overlying tendons.  Following this, the skin was marked for an out-of-plane approach and the area was prepared with alcohol scrub.  Then this area was re-examined using the same transducer, sterile gel in the no-touch technique.  While maintaining direct ultrasound guidance, local anesthesia was obtained with ethyl chloride and a 25G 5/8in needle was advanced using direct visualization and an out-of-plane technique and step-down approach into the joint space.  After ultrasound visualization of the needle tip in the target area and negative aspiration for blood, a mixture of 0.5 mL of 1% lidocaine and 0.5 mL of methylprednisolone 40 mg/ml was injected.   Impression and Plan  Successful ultrasound guided injection of the left CMC joint without immediate complication.     Course:  The patient tolerated the procedure without complication and was discharged in good condition after a short observation period.  The patient was instructed to contact me regarding any questions pertaining to the injection and follow-up when she would like to consider injection of her right CMC joint.  Glean Salen, MD PGY-4, Sports Medicine Fellow [facility]  I observed and examined the patient with the [facility] resident and agree with assessment and plan.  Note reviewed and modified by me. Sterling Big, MD

## 2023-07-19 NOTE — Assessment & Plan Note (Signed)
Injection of left today Consider RT CSI in the future See progress note

## 2023-07-27 ENCOUNTER — Ambulatory Visit (INDEPENDENT_AMBULATORY_CARE_PROVIDER_SITE_OTHER): Payer: Medicare PPO | Admitting: Family Medicine

## 2023-07-27 ENCOUNTER — Other Ambulatory Visit: Payer: Self-pay

## 2023-07-27 VITALS — BP 128/55 | Ht 62.0 in | Wt 163.0 lb

## 2023-07-27 DIAGNOSIS — M180 Bilateral primary osteoarthritis of first carpometacarpal joints: Secondary | ICD-10-CM

## 2023-07-27 MED ORDER — METHYLPREDNISOLONE ACETATE 40 MG/ML IJ SUSP
20.0000 mg | Freq: Once | INTRAMUSCULAR | Status: AC
  Administered 2023-07-19: 20 mg via INTRA_ARTICULAR

## 2023-07-27 MED ORDER — METHYLPREDNISOLONE ACETATE 40 MG/ML IJ SUSP
20.0000 mg | Freq: Once | INTRAMUSCULAR | Status: AC
Start: 2023-07-27 — End: 2023-07-27
  Administered 2023-07-27: 20 mg via INTRA_ARTICULAR

## 2023-07-27 NOTE — Progress Notes (Signed)
PCP: Yolanda Manges, DO  Subjective:   HPI: Patient is a 72 y.o. female here for Bilateral thumb/hand pain.  Patient has been dealing with pain in these areas over the past few years.  Patient did have an injection of her left CMC joint on 08/01.  Patient at that time did not want to get the other [facility] injected as she was overwhelmed.  Patient has been doing conservative measures including bracing as well as anti-inflammatories but has not had improvement.  Patient otherwise has no other concerns at this time..   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  Patient not sure why she was given Phenobarital,   " before 1978"   Urgency of urination    Vaginal vault prolapse    ANTERIOR    Current Outpatient Medications on File Prior to Visit  Medication Sig Dispense Refill   albuterol (PROVENTIL HFA) 108 (90 BASE) MCG/ACT inhaler Inhale 2 puffs into the lungs every 4 (four) hours as needed for wheezing or shortness of breath.      amLODipine (NORVASC) 5 MG tablet Take 1 tablet by mouth daily.     atorvastatin (LIPITOR) 20 MG tablet Take 20 mg by mouth at bedtime.     atorvastatin (LIPITOR) 20 MG tablet Take by mouth.     bisacodyl (DULCOLAX) 10 MG suppository Place 1 suppository (10 mg total) rectally daily as needed for moderate constipation. (Patient not taking: Reported on 07/23/2017) 12 suppository 0   cloNIDine (CATAPRES) 0.1 MG tablet Take 1 tablet  (0.1 mg total) by mouth 2 (two) times daily. 60 tablet 11   famotidine (PEPCID) 40 MG tablet TAKE 1 TABLET BY MOUTH EVERY DAY AT NIGHT     fluticasone (FLOVENT HFA) 110 MCG/ACT inhaler Inhale into the lungs.     HYDROmorphone (DILAUDID) 4 MG tablet Take 1 tablet (4 mg total) by mouth every 4 (four) hours as needed for severe pain. (Patient not taking: Reported on 07/23/2017) 30 tablet 0   Lidocaine (SALONPAS PAIN RELIEVING) 4 % PTCH Apply 1 patch to affected area(s) topically every 12 (twelve) hours. 15 patch 0   lisinopril (ZESTRIL) 40 MG tablet Take by mouth.     metFORMIN (GLUCOPHAGE) 500 MG tablet TAKE 1 TABLET BY MOUTH EVERY DAY WITH FOOD - OFFICE VISIT BEFORE NEXT REFILL  5   metFORMIN (GLUCOPHAGE) 500 MG tablet Take by mouth.     methadone (DOLOPHINE) 10 MG tablet Take by mouth.     methocarbamol (ROBAXIN) 500 MG tablet Take 1 tablet (500 mg total) by mouth every 6 (six) hours as needed for muscle spasms. (Patient not taking: Reported on 07/23/2017) 30 tablet 0   metoprolol succinate (TOPROL-XL) 25 MG 24 hr tablet Take 1 tablet  by mouth daily.     Multiple Vitamin (MULTIVITAMIN WITH MINERALS) TABS tablet Take 1 tablet by mouth daily.     naproxen (NAPROSYN) 500 MG tablet TAKE 1 TABLET BY MOUTH TWICE A DAY AS NEEDED 60 tablet 1   Nutritional Supplements (ESTROVEN PO) Take 1 tablet by mouth daily.     omeprazole (PRILOSEC) 40 MG capsule Take 1 capsule (40 mg total) by mouth daily. 30 capsule 11   omeprazole (PRILOSEC) 40 MG capsule Take 1 tablet by mouth daily.     ondansetron (ZOFRAN) 4 MG/2ML SOLN injection Inject 2 mLs (4 mg total) into the vein every 6 (six) hours as needed for nausea or vomiting. (Patient not taking: Reported on 07/23/2017) 2 mL 0   polyethylene glycol (MIRALAX / GLYCOLAX) packet Take 17 g by mouth daily as needed for mild constipation.      polyethylene glycol powder (GLYCOLAX/MIRALAX) 17 GM/SCOOP powder Take by mouth.     traMADol (ULTRAM) 50 MG tablet Take by mouth.      triamcinolone cream (KENALOG) 0.1 % Apply 1 application topically daily as needed for itching or rash.  1   No current facility-administered medications on file prior to visit.    Past Surgical History:  Procedure Laterality Date   ANTERIOR AND POSTERIOR REPAIR N/A 03/10/2013   Procedure: ANTERIOR (CYSTOCELE) AND POSTERIOR REPAIR (RECTOCELE);  Surgeon: Kathi Ludwig, MD;  Location: [facility];  Service: Urology;  Laterality: N/A;   [employer] UPHOLD LITE ANTERIOR VAULT REPAIR  POSSIBLE OP WITH OBSERVATION POSSIBLE FLOOR BED  Uphold LITE w/[employer] number [id number] qty 2 Xenform soft tissue repair matrix 6x7 cm [id number]    BREAST REDUCTION SURGERY     BREAST SURGERY     B/L removal of benign cysts   COLONOSCOPY W/ POLYPECTOMY     CYSTOCELE REPAIR N/A 03/10/2013   Procedure:  Anterior vaginal vault prolapse repair, with [employer] uphold light sacrospinous fixation using U. device and mesh repair with [name] plication   ;  Surgeon: Kathi Ludwig, MD;  Location: [facility];  Service: Urology;  Laterality: N/A;   ESOPHAGOGASTRODUODENOSCOPY     TOTAL KNEE ARTHROPLASTY Left 04/08/2013   Procedure: TOTAL KNEE ARTHROPLASTY;  Surgeon: Cammy Copa, MD;  Location: [facility] OR;  Service: Orthopedics;  Laterality: Left;  Left total knee arthroplasty   TOTAL KNEE ARTHROPLASTY Right 05/31/2017   Procedure: RIGHT TOTAL KNEE ARTHROPLASTY;  Surgeon: Cammy Copa, MD;  Location: [facility] OR;  Service: Orthopedics;  Laterality: Right;   TUBAL LIGATION      Allergies  Allergen Reactions   Phenobarbital Other (See Comments)     ? SEIZURES ?  [ QUESTION IF SZs RESULT FROM PHENOBARB ? ]     BP (!) 128/55   Ht 5\' 2"  (1.575 m)   Wt 163 lb (73.9 kg)   BMI 29.81 kg/m       No data to display              No data to display              Objective:  Physical Exam:  Gen: NAD, comfortable in exam room  Right  Wrist/Hand MSK: No edema, erythema, ecchymosis.  Bony protuberance at [facility] joint on the dorsal aspect. Full range of motion. Tender palpation overlying CMC joint.  Nontender palpation of first metacarpal.  First MCP nontender. + CMC Grind    Assessment & Plan:  1. 1. Arthritis of  carpometacarpal (CMC) joint of both thumbs - Patient tolerated procedure well.  Patient was advised to follow-up if any concerns.  Will evaluate how long patient has benefit from injection. - [place] LIMITED JOINT SPACE STRUCTURES UP RIGHT; Future   Ultrasound guided injection of the Left CMC joint The procedure below was performed with ultrasound guidance, with permanent recording and reporting.   PROCEDURAL PAUSE:   A procedural pause was conducted to verify correct patient identity, procedure to be performed and as applicable, correct side and site, and correct patient position.   INFORMED CONSENT: I discussed the risks, possible benefits, and alternatives to injection.  Specifically the risk of skin hypopigmentation was discussed with the patient.  Following denial of allergy and review of potential side effects and complications (including but not necessarily limited to lung collapse, infection, allergic reaction, local tissue breakdown, systemic effects of corticosteroids, elevation of blood glucose, injury to soft tissue and/or nerves and seizure), all questions were answered and consent was given to proceed.  Patient verbalized understanding.    PROCEDURE DETAILS:   Prior to the procedure, the left [facility] joint was examined with a high-frequency hockey stick transducer to determine the optimal needle path and visualize the joint both in short and long axis, with care to avoid overlying tendons.  Following this, the skin was marked for an in-plane approach and the area was prepared with alcohol swabs.  Then this area was re-examined using the same transducer, sterile gel in the no-touch technique.  While maintaining  direct ultrasound guidance, local anesthesia was obtained with ethyl chloride and a 25G 5/8in needle was advanced using direct visualization and an in-plane technique and step-down approach into the joint space.  After ultrasound visualization of the needle tip in the target area and negative aspiration for blood, a mixture of 0.5 mL of 1% lidocaine and 0.5 mL of methylprednisolone 40 mg/ml was injected.   Impression and Plan  Successful ultrasound guided injection of the left CMC joint without immediate complication.     Brenton Grills MD, PGY-4  Sports Medicine Fellow [facility]

## 2023-07-27 NOTE — Addendum Note (Signed)
Addended by: Rutha Bouchard E on: 07/27/2023 11:08 AM   Modules accepted: Orders

## 2023-07-30 ENCOUNTER — Encounter: Payer: Self-pay | Admitting: Family Medicine

## 2023-08-15 ENCOUNTER — Encounter: Payer: Self-pay | Admitting: Sports Medicine

## 2023-08-15 ENCOUNTER — Ambulatory Visit (HOSPITAL_BASED_OUTPATIENT_CLINIC_OR_DEPARTMENT_OTHER)
Admission: RE | Admit: 2023-08-15 | Discharge: 2023-08-15 | Disposition: A | Discharge status: Home / Self Care | Payer: Medicare PPO | Source: Ambulatory Visit | Attending: Sports Medicine | Admitting: Sports Medicine

## 2023-08-15 ENCOUNTER — Ambulatory Visit (INDEPENDENT_AMBULATORY_CARE_PROVIDER_SITE_OTHER): Payer: Medicare PPO | Admitting: Sports Medicine

## 2023-08-15 VITALS — BP 130/64 | Ht 62.0 in | Wt 163.0 lb

## 2023-08-15 DIAGNOSIS — M19012 Primary osteoarthritis, left shoulder: Secondary | ICD-10-CM | POA: Insufficient documentation

## 2023-08-15 MED ORDER — METHYLPREDNISOLONE ACETATE 40 MG/ML IJ SUSP
40.0000 mg | Freq: Once | INTRAMUSCULAR | Status: AC
  Administered 2023-08-15: 40 mg via INTRA_ARTICULAR

## 2023-08-15 NOTE — Progress Notes (Signed)
   Subjective:    Patient ID: Gloria Patterson, female    DOB: 1951/11/03, 72 y.o.   MRN: [id number]  HPI chief complaint: Left shoulder pain  Patient presents with returning left shoulder pain.  She has been seen with the same pain in the past.  She has received multiple cortisone injections.  Each injection has helped.  Pain is diffuse throughout the shoulder.  No recent trauma.  Pain is worse with abduction.    Review of Systems As above    Objective:   Physical Exam  Well-developed, well-nourished.  No acute distress  Left shoulder: Exam is limited by pain.  Limited range of motion, especially with active abduction.  No soft tissue swelling.  No erythema.  Global weakness secondary to pain.  Neurovascularly intact distally.      Assessment & Plan:   Left shoulder pain  Left shoulder is injected today with cortisone.  I would like to get an x-ray of the left shoulder including AP and lateral view.  Phone follow-up with her with those results when available.  Consent obtained and verified. Time-out conducted. Noted no overlying erythema, induration, or other signs of local infection. Skin prepped in a sterile fashion. Topical analgesic spray: Ethyl chloride. Joint: Left shoulder, subacromial Needle: 25-gauge 1.5 inch Completed without difficulty. Meds: 3 cc 1% Xylocaine, 1 cc (40 mg) Depo-Medrol  This note was dictated using Dragon naturally speaking software and may contain errors in syntax, spelling, or content which have not been identified prior to signing this note.

## 2023-08-29 ENCOUNTER — Telehealth: Payer: Self-pay | Admitting: Sports Medicine

## 2023-08-29 NOTE — Telephone Encounter (Signed)
  I spoke with the patient on the phone today after reviewing x-rays of her left shoulder.  She has only mild degenerative changes.  She is doing much better after last week's cortisone injection.  She has received multiple cortisone injections in the past.  At some point, we may need to consider MRI.  She will follow-up as needed.

## 2023-10-17 ENCOUNTER — Ambulatory Visit (INDEPENDENT_AMBULATORY_CARE_PROVIDER_SITE_OTHER): Payer: Medicare PPO | Admitting: Sports Medicine

## 2023-10-17 ENCOUNTER — Encounter: Payer: Self-pay | Admitting: Sports Medicine

## 2023-10-17 VITALS — BP 118/70 | Ht 62.0 in | Wt 163.0 lb

## 2023-10-17 DIAGNOSIS — M180 Bilateral primary osteoarthritis of first carpometacarpal joints: Secondary | ICD-10-CM

## 2023-10-17 NOTE — Progress Notes (Signed)
Subjective:    Patient ID: Gloria Patterson, female    DOB: [birth year], 72 y.o.   MRN: [id number]  HPI  Patient presents today with left thumb pain.  She has been seen in our [place] office and received injections for [facility] osteoarthritis.  Last appointment was on August 9.  Injections have not been very helpful and she has a follow-up appointment at that office next week.  She is experiencing some left shoulder pain but feels like it may be actually coming from her hand.  I injected her left glenohumeral joint approximately 2 months ago and that has been helpful.   Review of Systems As above    Objective:   Physical Exam  Left shoulder: Patient has fairly good active range of motion without pain.  No tenderness to palpation.  No signs of impingement.  Left thumb: There is some tenderness to palpation around the [facility] joint I do not appreciate swelling in the first extensor compartment.  Positive CMC grind.      Assessment & Plan:   Left thumb pain secondary to [facility] osteoarthritis  Patient will keep her appointment in [place] next week as scheduled.  Left shoulder appears to be doing well so no further workup or treatment needed at this time.  Follow-up as needed.  This note was dictated using Dragon naturally speaking software and may contain errors in syntax, spelling, or content which have not been identified prior to signing this note.

## 2023-10-18 ENCOUNTER — Ambulatory Visit (INDEPENDENT_AMBULATORY_CARE_PROVIDER_SITE_OTHER): Payer: Medicare PPO | Admitting: Family Medicine

## 2023-10-18 ENCOUNTER — Telehealth: Payer: Self-pay | Admitting: Orthopedic Surgery

## 2023-10-18 ENCOUNTER — Encounter: Payer: Self-pay | Admitting: Family Medicine

## 2023-10-18 VITALS — BP 134/68 | Ht 62.0 in | Wt 168.0 lb

## 2023-10-18 DIAGNOSIS — M1812 Unilateral primary osteoarthritis of first carpometacarpal joint, left hand: Secondary | ICD-10-CM | POA: Diagnosis not present

## 2023-10-18 DIAGNOSIS — M180 Bilateral primary osteoarthritis of first carpometacarpal joints: Secondary | ICD-10-CM | POA: Diagnosis not present

## 2023-10-18 NOTE — Assessment & Plan Note (Signed)
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  Right has been feeling good status post cortisone injection with [place] 07/27/2023.  Continues to have ongoing left thumb pain that has not responded to cortisone injection  Plan: -Will refer her to orthopedic hand surgeon for the left [facility] osteoarthritis as she has failed conservative measures and she is interested in surgery.  Referral was placed today -Will continue current treatment for her right thumb as it is currently doing well.  She can follow-up with [place] as needed for this

## 2023-10-18 NOTE — Telephone Encounter (Signed)
Pt would like to be notified if there is any way she can get in before December, I advised we can put her on the wait list

## 2023-10-18 NOTE — Progress Notes (Signed)
DATE OF VISIT: 10/18/2023        Gloria Patterson DOB: 10-24-1951 MRN: [id number]  CC:  f/u Lt hand pain  History of present Illness: Gloria Patterson is a 72 y.o. female who presents for a follow-up visit for left hand pain Rt-hand dominant Has known CMC OA of the thumb - had u/s guided injection of the Lt [facility] 07/19/23 with [place] - helped for a few days, but did not provide long-term relief - CSI 01/15/23 which helped for about a month Has been using brace Pain is getting worse Using Bio-Freeze prn She feels she is ready to discuss surgery  Rt CMC thumb inj on 07/27/23 - has been helpful - Rt side feeling good currently  Medications:  Outpatient Encounter Medications as of 10/18/2023  Medication Sig   albuterol (PROVENTIL HFA) 108 (90 BASE) MCG/ACT inhaler Inhale 2 puffs into the lungs every 4 (four) hours as needed for wheezing or shortness of breath.    amLODipine (NORVASC) 5 MG tablet Take 1 tablet by mouth daily.   atorvastatin (LIPITOR) 20 MG tablet Take 20 mg by mouth at bedtime.   atorvastatin (LIPITOR) 20 MG tablet Take by mouth.   bisacodyl (DULCOLAX) 10 MG suppository Place 1 suppository (10 mg total) rectally daily as needed for moderate constipation. (Patient not taking: Reported on 07/23/2017)   cloNIDine (CATAPRES) 0.1 MG tablet Take 1 tablet (0.1 mg total) by mouth 2 (two) times daily.   famotidine (PEPCID) 40 MG tablet TAKE 1 TABLET BY MOUTH EVERY DAY AT NIGHT   fluticasone (FLOVENT HFA) 110 MCG/ACT inhaler Inhale into the lungs.   HYDROmorphone (DILAUDID) 4 MG tablet Take 1 tablet (4 mg total) by mouth every 4 (four) hours as needed for severe pain. (Patient not taking: Reported on 07/23/2017)   Lidocaine (SALONPAS PAIN RELIEVING) 4 % PTCH Apply 1 patch to affected area(s) topically every 12 (twelve) hours.   lisinopril (ZESTRIL) 40 MG tablet Take by mouth.   metFORMIN (GLUCOPHAGE) 500 MG tablet TAKE 1 TABLET BY MOUTH EVERY DAY WITH FOOD - OFFICE VISIT BEFORE NEXT  REFILL   metFORMIN (GLUCOPHAGE) 500 MG tablet Take by mouth.   methadone (DOLOPHINE) 10 MG tablet Take by mouth.   methocarbamol (ROBAXIN) 500 MG tablet Take 1 tablet (500 mg total) by mouth every 6 (six) hours as needed for muscle spasms. (Patient not taking: Reported on 07/23/2017)   metoprolol succinate (TOPROL-XL) 25 MG 24 hr tablet Take 1 tablet by mouth daily.   Multiple Vitamin (MULTIVITAMIN WITH MINERALS) TABS tablet Take 1 tablet by mouth daily.   naproxen (NAPROSYN) 500 MG tablet TAKE 1 TABLET BY MOUTH TWICE A DAY AS NEEDED   Nutritional Supplements (ESTROVEN PO) Take 1 tablet by mouth daily.   omeprazole (PRILOSEC) 40 MG capsule Take 1 capsule (40 mg total) by mouth daily.   omeprazole (PRILOSEC) 40 MG capsule Take 1 tablet by mouth daily.   ondansetron (ZOFRAN) 4 MG/2ML SOLN injection Inject 2 mLs (4 mg total) into the vein every 6 (six) hours as needed for nausea or vomiting. (Patient not taking: Reported on 07/23/2017)   polyethylene glycol (MIRALAX / GLYCOLAX) packet Take 17 g by mouth daily as needed for mild constipation.    polyethylene glycol powder (GLYCOLAX/MIRALAX) 17 GM/SCOOP powder Take by mouth.   traMADol (ULTRAM) 50 MG tablet Take by mouth.   triamcinolone cream (KENALOG) 0.1 % Apply 1 application topically daily as needed for itching or rash.   No facility-administered encounter medications on file  as of 10/18/2023.    Allergies: is allergic to phenobarbital.  Physical Examination: Vitals: BP 134/68   Ht 5\' 2"  (1.575 m)   Wt 168 lb (76.2 kg)   BMI 30.73 kg/m  GENERAL:  Gloria Patterson is a 72 y.o. female appearing their stated age, alert and oriented x 3, in no apparent distress.  SKIN: no rashes or lesions, skin clean, dry, intact MSK:  HAND/WRIST: Left hand with tenderness to palpation at the [facility] joint of the thumb.  Slight soft tissue swelling.  No increased redness or warmth.  Has pain with thumb motion.  Minimal tenderness at the MCP joint and IP  joint. Right hand with no significant tenderness at the [facility] joint of the thumb.  No soft tissue swelling.  No increased redness or warmth.  No pain with thumb motion.  No tenderness at MCP joint or IP joint NEURO: sensation intact to light touch upper extremity bilaterally VASC: pulses 2+ and symmetric artery bilaterally, no edema  Assessment & Plan Arthritis of carpometacarpal (CMC) joint of both thumbs Bilateral thumb pain with known CMC osteoarthritis.  Right has been feeling good status post cortisone injection with [place] 07/27/2023.  Continues to have ongoing left thumb pain that has not responded to cortisone injection  Plan: -Will refer her to orthopedic hand surgeon for the left [facility] osteoarthritis as she has failed conservative measures and she is interested in surgery.  Referral was placed today -Will continue current treatment for her right thumb as it is currently doing well.  She can follow-up with [place] as needed for this Arthritis of carpometacarpal [facility]) joint of left thumb Ongoing left thumb pain at the [facility] joint with known osteoarthritis.  No improvement with prior cortisone injection x 2, most recently 07/19/2023.  No improvement with bracing and activity modification.  Patient now interested in considering surgery as she is exhausted all conservative measures  Plan: -Patient has exhausted conservative measures for the thumb, will refer her to orthopedic hand specialist to consider surgery.  Did briefly discuss possible PRP, but she is not interested -Follow-up with [place] as needed Patient expressed understanding & agreement with above.  Encounter Diagnoses  Name Primary?   Arthritis of carpometacarpal (CMC) joint of both thumbs    Arthritis of carpometacarpal (CMC) joint of left thumb Yes    Orders Placed This Encounter  Procedures   Ambulatory referral to Orthopedic Surgery

## 2023-10-18 NOTE — Assessment & Plan Note (Signed)
Ongoing left thumb pain at the [facility] joint with known osteoarthritis.  No improvement with prior cortisone injection x 2, most recently 07/19/2023.  No improvement with bracing and activity modification.  Patient now interested in considering surgery as she is exhausted all conservative measures  Plan: -Patient has exhausted conservative measures for the thumb, will refer her to orthopedic hand specialist to consider surgery.  Did briefly discuss possible PRP, but she is not interested -Follow-up with [place] as needed

## 2023-11-05 ENCOUNTER — Ambulatory Visit: Payer: Medicare PPO | Admitting: Family Medicine

## 2023-11-12 ENCOUNTER — Ambulatory Visit (INDEPENDENT_AMBULATORY_CARE_PROVIDER_SITE_OTHER): Payer: Medicare PPO | Admitting: Family Medicine

## 2023-11-12 ENCOUNTER — Encounter: Payer: Self-pay | Admitting: Family Medicine

## 2023-11-12 VITALS — BP 163/91 | Ht 62.0 in | Wt 163.0 lb

## 2023-11-12 DIAGNOSIS — I10 Essential (primary) hypertension: Secondary | ICD-10-CM

## 2023-11-12 DIAGNOSIS — M1812 Unilateral primary osteoarthritis of first carpometacarpal joint, left hand: Secondary | ICD-10-CM

## 2023-11-12 MED ORDER — MELOXICAM 15 MG PO TABS: 15.0000 mg | ORAL_TABLET | Freq: Every day | ORAL | 1 refills | Status: DC | PRN

## 2023-11-12 NOTE — Assessment & Plan Note (Signed)
Elevated BP today - did not take her medication yesterday or today  PLAN: - should take medication as directed - f/u with PCP as directed

## 2023-11-12 NOTE — Progress Notes (Signed)
DATE OF VISIT: 11/12/2023        [name] DOB: 1951-03-05 MRN: [id number]  CC:  f/u Left hand pain  History of present Illness: Gloria Patterson is a 72 y.o. female who presents for a follow-up visit for left hand pain Last seen by me 10/18/23.  Has known CMC OA of the thumb, no improvement with multiple injections Scheduled to see Hand Surgeon 11/23/23 Having increasing pain No improvement with Voltaren gel, Ibuprofen, Aleve No improvement with Tylenol Extra-strength 2 tabs 4-6 times a day No new injury or trauma Hx of prior opioid abuse - currently on Methadone daily, started about 13 years ago  Medications:  Outpatient Encounter Medications as of 11/12/2023  Medication Sig   meloxicam (MOBIC) 15 MG tablet Take 1 tablet (15 mg total) by mouth daily as needed for pain (arthritis pain).   albuterol (PROVENTIL HFA) 108 (90 BASE) MCG/ACT inhaler Inhale 2 puffs into the lungs every 4 (four) hours as needed for wheezing or shortness of breath.    amLODipine (NORVASC) 5 MG tablet Take 1 tablet by mouth daily.   atorvastatin (LIPITOR) 20 MG tablet Take 40 mg by mouth at bedtime.   fluticasone (FLOVENT HFA) 110 MCG/ACT inhaler Inhale into the lungs.   Lidocaine (SALONPAS PAIN RELIEVING) 4 % PTCH Apply 1 patch to affected area(s) topically every 12 (twelve) hours.   lisinopril (ZESTRIL) 40 MG tablet Take by mouth.   methadone (DOLOPHINE) 10 MG tablet Take by mouth.   metoprolol succinate (TOPROL-XL) 25 MG 24 hr tablet Take 1 tablet by mouth daily.   Multiple Vitamin (MULTIVITAMIN WITH MINERALS) TABS tablet Take 1 tablet by mouth daily.   naproxen (NAPROSYN) 500 MG tablet TAKE 1 TABLET BY MOUTH TWICE A DAY AS NEEDED   Nutritional Supplements (ESTROVEN PO) Take 1 tablet by mouth daily.   polyethylene glycol (MIRALAX / GLYCOLAX) packet Take 17 g by mouth daily as needed for mild constipation.    triamcinolone cream (KENALOG) 0.1 % Apply 1 application topically daily as needed for  itching or rash.   [DISCONTINUED] atorvastatin (LIPITOR) 20 MG tablet Take by mouth.   [DISCONTINUED] bisacodyl (DULCOLAX) 10 MG suppository Place 1 suppository (10 mg total) rectally daily as needed for moderate constipation. (Patient not taking: Reported on 07/23/2017)   [DISCONTINUED] cloNIDine (CATAPRES) 0.1 MG tablet Take 1 tablet (0.1 mg total) by mouth 2 (two) times daily.   [DISCONTINUED] famotidine (PEPCID) 40 MG tablet TAKE 1 TABLET BY MOUTH EVERY DAY AT NIGHT   [DISCONTINUED] HYDROmorphone (DILAUDID) 4 MG tablet Take 1 tablet (4 mg total) by mouth every 4 (four) hours as needed for severe pain. (Patient not taking: Reported on 07/23/2017)   [DISCONTINUED] metFORMIN (GLUCOPHAGE) 500 MG tablet TAKE 1 TABLET BY MOUTH EVERY DAY WITH FOOD - OFFICE VISIT BEFORE NEXT REFILL   [DISCONTINUED] metFORMIN (GLUCOPHAGE) 500 MG tablet Take by mouth.   [DISCONTINUED] methocarbamol (ROBAXIN) 500 MG tablet Take 1 tablet (500 mg total) by mouth every 6 (six) hours as needed for muscle spasms. (Patient not taking: Reported on 07/23/2017)   [DISCONTINUED] omeprazole (PRILOSEC) 40 MG capsule Take 1 capsule (40 mg total) by mouth daily.   [DISCONTINUED] omeprazole (PRILOSEC) 40 MG capsule Take 1 tablet by mouth daily.   [DISCONTINUED] ondansetron (ZOFRAN) 4 MG/2ML SOLN injection Inject 2 mLs (4 mg total) into the vein every 6 (six) hours as needed for nausea or vomiting. (Patient not taking: Reported on 07/23/2017)   [DISCONTINUED] polyethylene glycol powder (GLYCOLAX/MIRALAX) 17 GM/SCOOP powder  Take by mouth.   [DISCONTINUED] traMADol (ULTRAM) 50 MG tablet Take by mouth.   No facility-administered encounter medications on file as of 11/12/2023.    Allergies: is allergic to phenobarbital.  Physical Examination: Vitals: BP (!) 163/91   Ht 5\' 2"  (1.575 m)   Wt 163 lb (73.9 kg)   BMI 29.81 kg/m  GENERAL:  Gloria Patterson is a 72 y.o. female appearing their stated age, alert and oriented x 3, in no apparent  distress.  MSK: HAND/WRIST: Left hand with tenderness to palpation at the [facility] joint of the thumb.  Slight soft tissue swelling.  No increased redness or warmth.  Has pain with slight thumb motion.  Minimal tenderness at the MCP joint and IP joint. Decreased grip strength due to pain N/V/I distally  Assessment & Plan Arthritis of carpometacarpal (CMC) joint of left thumb Worsening left thumb pain at [facility] with known OA - no improvement with prior cortisone injection x 2 - no improvement with bracing & activity modification - no improvement with Tylenol Extra-strength 8-12 tabs/day - no improvement with OTC NSAIDs (topical and PO) - hx of opioid dependence on chronic Methadone therapy, therefore cannot take opiates  PLAN: - Rx for Mobic 15mg  po daily given.  Explained how to take medication & common side effects. Should stop taking if having GI side effects and notify office. - Can use Tylenol Extra-strength 500mg  1-2 tabs every 6-hours as needed, max 8-tabs/day.  Reviewed proper dosing - advised should not take more than 8-tabs a day due to concerns for possible liver toxicity - Keep f/u with Hand Surgeon as scheduled 11/23/23 - she is already on the cancellation list, but she will reach out to see if they have anything before her appointment - f/u with [place] prn Essential hypertension Elevated BP today - did not take her medication yesterday or today  PLAN: - should take medication as directed - f/u with PCP as directed   Patient expressed understanding & agreement with above.  Encounter Diagnoses  Name Primary?   Arthritis of carpometacarpal (CMC) joint of left thumb Yes   Essential hypertension     No orders of the defined types were placed in this encounter.

## 2023-11-12 NOTE — Patient Instructions (Signed)
You should keep your appointment with the Hand Specialist as scheduled 11/23/23.   - You can use Tylenol Extra-strength 500mg  1-2 tabs every 6-hours as needed, max 8-tabs/day. - I sent a prescription to the pharmacy for Mobic (Meloxicam) 15mg  1 tab by mouth daily.  You should take with food.  You should stop taking if having GI side effects and notify office.  You should not take with other NSAIDS including: Ibuprofen, Motrin, Advil, naproxen, Aleve - as these are all similar medications  You can reach out with any questions or concerns

## 2023-11-12 NOTE — Assessment & Plan Note (Signed)
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  Explained how to take medication & common side effects. Should stop taking if having GI side effects and notify office. - Can use Tylenol Extra-strength 500mg  1-2 tabs every 6-hours as needed, max 8-tabs/day.  Reviewed proper dosing - advised should not take more than 8-tabs a day due to concerns for possible liver toxicity - Keep f/u with Hand Surgeon as scheduled 11/23/23 - she is already on the cancellation list, but she will reach out to see if they have anything before her appointment - f/u with [place] prn

## 2023-11-23 ENCOUNTER — Ambulatory Visit: Payer: Medicare PPO | Admitting: Orthopedic Surgery

## 2023-11-27 ENCOUNTER — Ambulatory Visit (INDEPENDENT_AMBULATORY_CARE_PROVIDER_SITE_OTHER): Payer: Medicare PPO | Admitting: Orthopedic Surgery

## 2023-11-27 ENCOUNTER — Other Ambulatory Visit (INDEPENDENT_AMBULATORY_CARE_PROVIDER_SITE_OTHER): Payer: Self-pay

## 2023-11-27 DIAGNOSIS — M1812 Unilateral primary osteoarthritis of first carpometacarpal joint, left hand: Secondary | ICD-10-CM | POA: Diagnosis not present

## 2023-11-27 DIAGNOSIS — M79642 Pain in left hand: Secondary | ICD-10-CM | POA: Diagnosis not present

## 2023-11-27 NOTE — Progress Notes (Signed)
Gloria Patterson - 72 y.o. female MRN [id number]  Date of birth: 13-Dec-1951  Office Visit Note: Visit Date: 11/27/2023 PCP: Melvenia Beam, MD Referred by: Melvenia Beam,*  Subjective: No chief complaint on file.  HPI: Gloria Patterson is a pleasant 72 y.o. female who presents today for evaluation of ongoing left thumb basilar joint pain.  She states that the symptoms have been present now for over 6 months, worsening in nature.  She has trialed bracing without significant relief.  Underwent injection to the left thumb CMC interval in August of this year, also without lasting relief.  At this juncture, she is interested in discussing potential surgical intervention.  Denies any numbness or tingling.  Pertinent ROS were reviewed with the patient and found to be negative unless otherwise specified above in HPI.   Visit Reason: left hand/ cmc Duration of symptoms: 6+ months Hand dominance: right Occupation: retired Diabetic: No Smoking: No Heart/Lung History: none Blood Thinners:  none  Prior Testing/EMG: none Injections (Date): 07/19/23 Treatments: inj Prior Surgery: none  Assessment & Plan: Visit Diagnoses:  1. Pain in left hand     Plan: Extensive discussion was had with the patient today regarding her ongoing left thumb CMC osteoarthritis.  X-rays were reviewed in detail today which do show significant degenerative change at the left thumb [facility] articulation with associated STT arthritis.  We discussed treatment modalities ranging from conservative to surgical.  From a conservative standpoint we discussed bracing, injections, anti-inflammatory medications and activity modification.  From a surgical standpoint we discussed [facility] arthroplasty, risks and benefits as well as the postoperative protocol.  At this juncture, given the severity of symptoms that have remained refractory to conservative care, patient would like to move forward with surgical  intervention.  Given the significance of the degenerative change on x-ray which clinically correlates with examination, we can move forward with surgical scheduling of left thumb CMC arthroplasty at the next available date.  Risks and benefits of the procedure were discussed, risks including but not limited to infection, bleeding, scarring, stiffness, nerve injury, tendon injury, vascular injury, hardware complication, recurrence of symptoms and need for subsequent operation.  We also discussed the specifics of the postoperative protocol and the appropriate timeline.  Patient expressed understanding.   Follow-up: No follow-ups on file.   Meds & Orders: No orders of the defined types were placed in this encounter.   Orders Placed This Encounter  Procedures   XR Wrist Complete Left     Procedures: No procedures performed      Clinical History: No specialty comments available.  She reports that she has never smoked. She has never used smokeless tobacco. No results for input(s): "HGBA1C", "LABURIC" in the last 8760 hours.  Objective:   Vital Signs: There were no vitals taken for this visit.  Physical Exam  Gen: Well-appearing, in no acute distress; non-toxic CV: Regular Rate. Well-perfused. Warm.  Resp: Breathing unlabored on room air; no wheezing. Psych: Fluid speech in conversation; appropriate affect; normal thought process  Ortho Exam General: Patient is well appearing and in no distress. Cervical spine mobility is full in all directions:   Skin and Muscle: No skin changes are apparent to upper extremities.  Muscle bulk and contour normal, no signs of atrophy.      Range of Motion and Palpation Tests: Mobility is full about the elbows with flexion and extension.  Forearm supination and pronation are 85/85 bilaterally.  Wrist flexion/extension is 75/65 bilaterally.  Digital flexion and extension are full.  Thumb opposition is full to the base of the small fingers bilaterally.      No cords or nodules are palpated.  No triggering is observed.     Significant tenderness over the left thumb CMC articulation is observed, positive grind for pain, positive crepitus.  MP hyperextension negative.    Finklestein test positive   Neurologic, Vascular, Motor: Left hand Sensation is intact to light touch in the median/radial/ulnar distributions.  Tinel's testing negative at wrist level. Phalen's negative, Derkan's compression negative.  Fingers pink and well perfused.  Capillary refill is brisk.     Imaging: XR Wrist Complete Left  Result Date: 11/27/2023 X-rays of the left wrist were obtained today including pantrapezial view X-rays demonstrate significant degenerative change at the thumb [facility] interval with joint space narrowing, osteophyte formation and subchondral sclerosis.  There is also evidence of associated STT arthritis.   Past Medical/Family/Surgical/Social History: Medications & Allergies reviewed per EMR, new medications updated. Patient Active Problem List   Diagnosis Date Noted   Arthritis of carpometacarpal [facility]) joint of left thumb 10/18/2023   Arthritis of carpometacarpal [facility]) joint of both thumbs 07/19/2023   OA (osteoarthritis) of shoulder 11/23/2022   HCAP (healthcare-associated pneumonia) 06/03/2017   Sepsis (HCC) 06/03/2017   S/P TKR (total knee replacement), right 06/03/2017   Type 2 diabetes mellitus without complication (HCC) 06/03/2017   Arthritis of knee 05/31/2017   Essential hypertension 08/25/2013   GERD (gastroesophageal reflux disease) 05/23/2013   Narcotic withdrawal (HCC) 05/23/2013   Acute bronchitis 05/23/2013   Osteoarthritis of left knee 04/08/2013    Class: Diagnosis of   Upper airway cough syndrome 10/16/2012   Past Medical History:  Diagnosis Date   Arthritis    knee, shouder   Asthma    MILD   Chronic cough    Constipation    Diabetes mellitus without complication (HCC)    Type II   Frequency of urination     GERD (gastroesophageal reflux disease)    Hepatitis    Hepatitis C treated    History of hepatitis DX 1973  SECONDARY TO DRUG ABUSE--  UNKNOWN TYPE PER PT--  WAS TX'D    NO ISSURES OR S & S SINCE   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  Patient not sure why she was given Phenobarital,   " before 1978"   Urgency of urination    Vaginal vault prolapse    ANTERIOR   Family History  Problem Relation Age of Onset   Prostate cancer Father    Breast cancer Sister    Past Surgical History:  Procedure Laterality Date   ANTERIOR AND POSTERIOR REPAIR N/A 03/10/2013   Procedure: ANTERIOR (CYSTOCELE) AND POSTERIOR REPAIR (RECTOCELE);  Surgeon: Kathi Ludwig, MD;  Location: [facility];  Service: Urology;  Laterality: N/A;   [employer] UPHOLD LITE ANTERIOR VAULT REPAIR  POSSIBLE OP WITH OBSERVATION POSSIBLE FLOOR BED  Uphold LITE w/[employer] number [id number] qty 2 Xenform soft tissue repair matrix 6x7 cm [id number]    BREAST REDUCTION SURGERY     BREAST SURGERY     B/L removal of benign cysts   COLONOSCOPY W/ POLYPECTOMY     CYSTOCELE REPAIR N/A 03/10/2013   Procedure:  Anterior vaginal vault  prolapse repair, with [employer] uphold light sacrospinous fixation using U. device and mesh repair with [name] plication   ;  Surgeon: Kathi Ludwig, MD;  Location: [facility];  Service: Urology;  Laterality: N/A;   ESOPHAGOGASTRODUODENOSCOPY     TOTAL KNEE ARTHROPLASTY Left 04/08/2013   Procedure: TOTAL KNEE ARTHROPLASTY;  Surgeon: Cammy Copa, MD;  Location: [facility] OR;  Service: Orthopedics;  Laterality: Left;  Left total knee arthroplasty   TOTAL KNEE ARTHROPLASTY Right 05/31/2017   Procedure: RIGHT TOTAL KNEE ARTHROPLASTY;  Surgeon: Cammy Copa, MD;  Location: [facility] OR;  Service:  Orthopedics;  Laterality: Right;   TUBAL LIGATION     Social History   Occupational History   Not on file  Tobacco Use   Smoking status: Never   Smokeless tobacco: Never   Tobacco comments:    CANNOT HAVE NARCOTIC MEDICATIONS PER PT  Vaping Use   Vaping status: Never Used  Substance and Sexual Activity   Alcohol use: No   Drug use: No    Comment: Stopped using  Cocoine and Heroin 2011   Sexual activity: Never    Aunya Lemler Fara Boros) Denese Killings, M.D. [facility] 5:57 PM

## 2023-12-03 ENCOUNTER — Ambulatory Visit (INDEPENDENT_AMBULATORY_CARE_PROVIDER_SITE_OTHER): Payer: Medicare PPO | Admitting: Family Medicine

## 2023-12-03 ENCOUNTER — Telehealth: Payer: Self-pay | Admitting: Radiology

## 2023-12-03 VITALS — BP 129/67 | Ht 62.0 in | Wt 163.0 lb

## 2023-12-03 DIAGNOSIS — G8929 Other chronic pain: Secondary | ICD-10-CM | POA: Diagnosis not present

## 2023-12-03 DIAGNOSIS — M25512 Pain in left shoulder: Secondary | ICD-10-CM

## 2023-12-03 DIAGNOSIS — M7542 Impingement syndrome of left shoulder: Secondary | ICD-10-CM | POA: Diagnosis not present

## 2023-12-03 DIAGNOSIS — M19012 Primary osteoarthritis, left shoulder: Secondary | ICD-10-CM

## 2023-12-03 MED ORDER — METHYLPREDNISOLONE ACETATE 40 MG/ML IJ SUSP
40.0000 mg | Freq: Once | INTRAMUSCULAR | Status: AC
  Administered 2023-12-03: 40 mg via INTRA_ARTICULAR

## 2023-12-03 NOTE — Telephone Encounter (Signed)
Received call from [facility] with [facility]. Patient is in the office now with shoulder pain. She is scheduled for Left thumb CMC arthroplasty on 12/21/2023. Wanting to be sure she can inject left shoulder and it will not interfere with plans for surgery. Per Lequita Halt, ok to proceed as long as they are not injecting hand. Dondra Spry advised.

## 2023-12-03 NOTE — Progress Notes (Signed)
CHIEF COMPLAINT: No chief complaint on file.  _____________________________________________________________ SUBJECTIVE  HPI  Pt is a 72 y.o. female here for evaluation of chronic left shoulder pain  Previously evaluated for left shoulder pain, last seen 08/15/2023.  Note reviewed: -Shoulder pain was diffuse.  Received subacromial bursa injection -X-ray with mild degenerative changes  Also seen 06/05/23 -+ Neck pain, worse raising arm overhead GHJ CSI with little benefit.  Subacromial injection was given at that time  Pain came back 1 month ago, was trying to see if her hand was causing this pain, was given meloxicam at the time. Was playing with great grandchildren/[facility], thinks she may have exacerbated the pain that way.  Most tender anteriorly, can radiate to back of shoulder and to elbow Biofreeze on not helping Other therapies tried: meloxicam did not help, heat with some mild help Denies hx trauma to this shoulder No numbness/tingling ------------------------------------------------------------------------------------------------------ Past Medical History:  Diagnosis Date   Arthritis    knee, shouder   Asthma    MILD   Chronic cough    Constipation    Diabetes mellitus without complication (HCC)    Type II   Frequency of urination    GERD (gastroesophageal reflux disease)    Hepatitis    Hepatitis C treated    History of hepatitis DX 1973  SECONDARY TO DRUG ABUSE--  UNKNOWN TYPE PER PT--  WAS TX'D    NO ISSURES OR S & S SINCE   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  Patient not sure why she was given Phenobarital,   " before 1978"   Urgency of urination    Vaginal vault prolapse    ANTERIOR    Past Surgical History:  Procedure Laterality Date   ANTERIOR AND POSTERIOR REPAIR N/A 03/10/2013   Procedure: ANTERIOR  (CYSTOCELE) AND POSTERIOR REPAIR (RECTOCELE);  Surgeon: Kathi Ludwig, MD;  Location: [facility];  Service: Urology;  Laterality: N/A;   [employer] UPHOLD LITE ANTERIOR VAULT REPAIR  POSSIBLE OP WITH OBSERVATION POSSIBLE FLOOR BED  Uphold LITE w/[employer] number [id number] qty 2 Xenform soft tissue repair matrix 6x7 cm [id number]    BREAST REDUCTION SURGERY     BREAST SURGERY     B/L removal of benign cysts   COLONOSCOPY W/ POLYPECTOMY     CYSTOCELE REPAIR N/A 03/10/2013   Procedure:  Anterior vaginal vault prolapse repair, with [employer] uphold light sacrospinous fixation using U. device and mesh repair with [name] plication   ;  Surgeon: Kathi Ludwig, MD;  Location: [facility];  Service: Urology;  Laterality: N/A;   ESOPHAGOGASTRODUODENOSCOPY     TOTAL KNEE ARTHROPLASTY Left 04/08/2013   Procedure: TOTAL KNEE ARTHROPLASTY;  Surgeon: Cammy Copa, MD;  Location: [facility] OR;  Service: Orthopedics;  Laterality: Left;  Left total knee arthroplasty   TOTAL KNEE ARTHROPLASTY Right 05/31/2017   Procedure: RIGHT TOTAL KNEE ARTHROPLASTY;  Surgeon: Cammy Copa, MD;  Location: [facility] OR;  Service: Orthopedics;  Laterality: Right;   TUBAL LIGATION        Outpatient Encounter Medications as of 12/03/2023  Medication Sig   albuterol (PROVENTIL HFA) 108 (90 BASE) MCG/ACT inhaler Inhale 2 puffs into the lungs every 4 (four) hours as needed for wheezing or shortness of breath.  amLODipine (NORVASC) 5 MG tablet Take 1 tablet by mouth daily.   atorvastatin (LIPITOR) 20 MG tablet Take 40 mg by mouth at bedtime.   fluticasone (FLOVENT HFA) 110 MCG/ACT inhaler Inhale into the lungs.   Lidocaine (SALONPAS PAIN RELIEVING) 4 % PTCH Apply 1 patch to affected area(s) topically every 12 (twelve) hours.   lisinopril (ZESTRIL) 40 MG tablet Take by mouth.   meloxicam (MOBIC) 15 MG tablet Take 1 tablet (15 mg total) by mouth daily as  needed for pain (arthritis pain).   methadone (DOLOPHINE) 10 MG tablet Take by mouth.   metoprolol succinate (TOPROL-XL) 25 MG 24 hr tablet Take 1 tablet by mouth daily.   Multiple Vitamin (MULTIVITAMIN WITH MINERALS) TABS tablet Take 1 tablet by mouth daily.   naproxen (NAPROSYN) 500 MG tablet TAKE 1 TABLET BY MOUTH TWICE A DAY AS NEEDED   Nutritional Supplements (ESTROVEN PO) Take 1 tablet by mouth daily.   polyethylene glycol (MIRALAX / GLYCOLAX) packet Take 17 g by mouth daily as needed for mild constipation.    triamcinolone cream (KENALOG) 0.1 % Apply 1 application topically daily as needed for itching or rash.   [EXPIRED] methylPREDNISolone acetate (DEPO-MEDROL) injection 40 mg    No facility-administered encounter medications on file as of 12/03/2023.    ------------------------------------------------------------------------------------------------------  _____________________________________________________________ OBJECTIVE  PHYSICAL EXAM  Today's Vitals   12/03/23 1054  BP: 129/67  Weight: 163 lb (73.9 kg)  Height: 5\' 2"  (1.575 m)   Body mass index is 29.81 kg/m.   reviewed  General: A+Ox3, no acute distress, well-nourished, appropriate affect CV: pulses 2+ regular, nondiaphoretic, no peripheral edema, cap refill <2sec Lungs: no audible wheezing, non-labored breathing, bilateral chest rise/fall, nontachypneic Skin: warm, well-perfused, non-icteric, no susp lesions or rashes Neuro: no focal deficits. Sensation intact, muscle tone wnl, no atrophy Psych: no signs of depression or anxiety MSK:     L Shoulder:  No deformity, swelling or muscle wasting FF 180, abd 180, int 0, ext 90 TTP subacromial space, anterior humeral head, biceps groove NTTP over the Rivereno, clavicle, ac, coracoid, deltoid, scap spine, musculature, trap, cervical spine +neer, +hawkins, +empty can, +scarf test, -hornblower, +resisted anterior flexion, +belly press, +speeds, +obriens -  yergason  08/23/23 left shoulder x-ray  independently reviewed: Mild AC joint degenerative changes, query inferior humeral head osteophyte, mild glenohumeral joint degenerative changes, cystic changes of lateral humeral head _____________________________________________________________ ASSESSMENT/PLAN Diagnoses and all orders for this visit:  Chronic left shoulder pain  Primary osteoarthritis of left shoulder  Rotator cuff impingement syndrome, left  Other orders -     methylPREDNISolone acetate (DEPO-MEDROL) injection 40 mg   Acute on chronic L shoulder pain refractory to meloxicam, several months of HEP. Mild OA on XR, incongruent with severity of exam without notable inciting events. Diffusely positive exam today involving AC joint, RTC, subacromial impingement. Discussed options for work-up, Yasuri interested in MRI for further evaluation and subacromial bursa injection for therapeutic relief  Procedure Note Subacromial Bursa CSI  Injection Site: left  After PARQ discussed and verbal consent given, the site was cleaned with alcohol. Steroid injection was performed using sterile technique with 4mL 1% lidocaine, 1mL 40mg /mL kenalog and 22G 1.5in needle. This was well tolerated and resulted in marked relief. Dressing placed and post injection instructions were given including a discussion of likely return of pain today after the anesthetic wears off (with the possibility of worsened pain) until the steroid starts to work in 1-3 days. Call or return to clinic prn if  these symptoms worsen or fail to improve as anticipated.  F/u pending MRI results  Electronically signed by: Burna Forts, MD 12/03/2023 12:36 PM

## 2023-12-06 ENCOUNTER — Encounter (HOSPITAL_BASED_OUTPATIENT_CLINIC_OR_DEPARTMENT_OTHER): Payer: Self-pay | Admitting: Orthopedic Surgery

## 2023-12-06 ENCOUNTER — Other Ambulatory Visit: Payer: Self-pay

## 2023-12-06 NOTE — Progress Notes (Signed)
   12/06/23 0846  Pre-op Phone Call  Surgery Date Verified 12/21/23  Arrival Time Verified 0600  Surgery Location Verified [facility] [place]  Medical History Reviewed Yes  Is the patient taking a GLP-1 receptor agonist? No  Does the patient have diabetes? (S)  Type II (pt states she doesnt have it any more)  Does the patient use a Continuous Blood Glucose Monitor? No  Is the patient on an insulin pump? No  Has the diabetes coordinator been notified? No  Do you have a history of heart problems? No  Does patient have other implanted devices? No  Patient Teaching Enhanced Recovery;Pre / Post Procedure  Patient educated about smoking cessation 24 hours prior to surgery. N/A Non-Smoker  Patient verbalizes understanding of bowel prep? N/A  THA/TKA patients only:  By your surgery date, will you have been taking narcotics for 90 days or greater? No  Med Rec Completed Yes  Take the Following Meds the Morning of Surgery metoprolol, norvasc, methodone, inhaler if needed  Recent  Lab Work, EKG, CXR? No  NPO (Including gum & candy) After midnight  Allowed clear liquids Water;Gatorade  (diabetics please choose diet or no sugar options)  Patient instructed to stop clear liquids including Carb loading drink at: 0430  Stop Solids, Milk, Candy, and Gum STARTING AT MIDNIGHT  Did patient view EMMI videos? No  Responsible adult to drive and be with you for 24 hours? Yes  Name & Phone Number for Ride/Caregiver grandson Debin  No Jewelry, money, nail polish or make-up.  No lotions, powders, perfumes. No shaving  48 hrs. prior to surgery. Yes  Contacts, Dentures & Glasses Will Have to be Removed Before OR. Yes  Please bring your ID and Insurance Card the morning of your surgery. (Surgery Centers Only) Yes  Bring any papers or x-rays with you that your surgeon gave you. Yes  Instructed to contact the location of procedure/ provider if they or anyone in their household develops symptoms or tests positive for COVID-19, has  close contact with someone who tests positive for COVID, or has known exposure to any contagious illness. Yes  Call this number the morning of surgery  with any problems that may cancel your surgery. [phone]  Covid-19 Assessment  Have you had a positive COVID-19 test within the previous 90 days? No  COVID Testing Guidance Proceed with the additional questions.  Patient's surgery required a COVID-19 test (cardiothoracic, complex ENT, and bronchoscopies/ EBUS) No  Have you been unmasked and in close contact with anyone with COVID-19 or COVID-19 symptoms within the past 10 days? No  Do you or anyone in your household currently have any COVID-19 symptoms? No

## 2023-12-14 NOTE — Addendum Note (Signed)
Addended by: Merrilyn Puma on: 12/14/2023 09:50 AM   Modules accepted: Orders

## 2023-12-20 ENCOUNTER — Encounter (HOSPITAL_BASED_OUTPATIENT_CLINIC_OR_DEPARTMENT_OTHER)
Admission: RE | Admit: 2023-12-20 | Discharge: 2023-12-20 | Disposition: A | Discharge status: Home / Self Care | Payer: No Typology Code available for payment source | Source: Ambulatory Visit | Attending: Orthopedic Surgery | Admitting: Orthopedic Surgery

## 2023-12-20 DIAGNOSIS — Z01818 Encounter for other preprocedural examination: Secondary | ICD-10-CM | POA: Insufficient documentation

## 2023-12-20 DIAGNOSIS — K219 Gastro-esophageal reflux disease without esophagitis: Secondary | ICD-10-CM | POA: Diagnosis not present

## 2023-12-20 DIAGNOSIS — Z01810 Encounter for preprocedural cardiovascular examination: Secondary | ICD-10-CM | POA: Diagnosis present

## 2023-12-20 DIAGNOSIS — R9431 Abnormal electrocardiogram [ECG] [EKG]: Secondary | ICD-10-CM | POA: Diagnosis not present

## 2023-12-20 DIAGNOSIS — M1812 Unilateral primary osteoarthritis of first carpometacarpal joint, left hand: Secondary | ICD-10-CM | POA: Diagnosis present

## 2023-12-20 DIAGNOSIS — J45909 Unspecified asthma, uncomplicated: Secondary | ICD-10-CM | POA: Diagnosis not present

## 2023-12-20 DIAGNOSIS — Z01812 Encounter for preprocedural laboratory examination: Secondary | ICD-10-CM | POA: Diagnosis present

## 2023-12-20 DIAGNOSIS — Z79899 Other long term (current) drug therapy: Secondary | ICD-10-CM | POA: Diagnosis not present

## 2023-12-20 DIAGNOSIS — I10 Essential (primary) hypertension: Secondary | ICD-10-CM | POA: Diagnosis not present

## 2023-12-20 LAB — COMPREHENSIVE METABOLIC PANEL
ALT: 20 U/L (ref 0–44)
AST: 25 U/L (ref 15–41)
Albumin: 3.6 g/dL (ref 3.5–5.0)
Alkaline Phosphatase: 71 U/L (ref 38–126)
Anion gap: 9 (ref 5–15)
BUN: 16 mg/dL (ref 8–23)
CO2: 26 mmol/L (ref 22–32)
Calcium: 9.2 mg/dL (ref 8.9–10.3)
Chloride: 105 mmol/L (ref 98–111)
Creatinine, Ser: 1.01 mg/dL — ABNORMAL HIGH (ref 0.44–1.00)
GFR, Estimated: 58 mL/min — ABNORMAL LOW (ref >60)
Glucose, Bld: 117 mg/dL — ABNORMAL HIGH (ref 70–99)
Potassium: 4 mmol/L (ref 3.5–5.1)
Sodium: 140 mmol/L (ref 135–145)
Total Bilirubin: 0.3 mg/dL (ref 0.0–1.2)
Total Protein: 7.3 g/dL (ref 6.5–8.1)

## 2023-12-21 ENCOUNTER — Other Ambulatory Visit: Payer: Self-pay

## 2023-12-21 ENCOUNTER — Encounter (HOSPITAL_BASED_OUTPATIENT_CLINIC_OR_DEPARTMENT_OTHER): Payer: Self-pay | Admitting: Orthopedic Surgery

## 2023-12-21 ENCOUNTER — Other Ambulatory Visit: Payer: Self-pay | Admitting: Orthopedic Surgery

## 2023-12-21 ENCOUNTER — Encounter (HOSPITAL_BASED_OUTPATIENT_CLINIC_OR_DEPARTMENT_OTHER)
Admission: RE | Disposition: A | Discharge status: Home / Self Care | Payer: Self-pay | Source: Home / Self Care | Attending: Orthopedic Surgery

## 2023-12-21 ENCOUNTER — Ambulatory Visit (HOSPITAL_BASED_OUTPATIENT_CLINIC_OR_DEPARTMENT_OTHER)
Admission: RE | Admit: 2023-12-21 | Discharge: 2023-12-21 | Disposition: A | Discharge status: Home / Self Care | Payer: No Typology Code available for payment source | Attending: Orthopedic Surgery | Admitting: Orthopedic Surgery

## 2023-12-21 ENCOUNTER — Ambulatory Visit (HOSPITAL_BASED_OUTPATIENT_CLINIC_OR_DEPARTMENT_OTHER): Payer: No Typology Code available for payment source | Admitting: Anesthesiology

## 2023-12-21 ENCOUNTER — Ambulatory Visit (HOSPITAL_BASED_OUTPATIENT_CLINIC_OR_DEPARTMENT_OTHER): Payer: No Typology Code available for payment source

## 2023-12-21 DIAGNOSIS — K219 Gastro-esophageal reflux disease without esophagitis: Secondary | ICD-10-CM | POA: Insufficient documentation

## 2023-12-21 DIAGNOSIS — M1812 Unilateral primary osteoarthritis of first carpometacarpal joint, left hand: Secondary | ICD-10-CM

## 2023-12-21 DIAGNOSIS — J45909 Unspecified asthma, uncomplicated: Secondary | ICD-10-CM | POA: Insufficient documentation

## 2023-12-21 DIAGNOSIS — Z79899 Other long term (current) drug therapy: Secondary | ICD-10-CM | POA: Insufficient documentation

## 2023-12-21 DIAGNOSIS — I10 Essential (primary) hypertension: Secondary | ICD-10-CM

## 2023-12-21 DIAGNOSIS — E119 Type 2 diabetes mellitus without complications: Secondary | ICD-10-CM

## 2023-12-21 DIAGNOSIS — Z01818 Encounter for other preprocedural examination: Secondary | ICD-10-CM

## 2023-12-21 HISTORY — PX: CARPOMETACARPEL SUSPENSION PLASTY: SHX5005

## 2023-12-21 SURGERY — CARPOMETACARPEL (CMC) SUSPENSION PLASTY
Anesthesia: Regional | Laterality: Left

## 2023-12-21 MED ORDER — ACETAMINOPHEN 10 MG/ML IV SOLN
INTRAVENOUS | Status: AC
  Filled 2023-12-21: qty 100

## 2023-12-21 MED ORDER — DEXAMETHASONE SODIUM PHOSPHATE 10 MG/ML IJ SOLN
INTRAMUSCULAR | Status: AC
  Filled 2023-12-21: qty 1

## 2023-12-21 MED ORDER — DEXAMETHASONE SODIUM PHOSPHATE 4 MG/ML IJ SOLN
INTRAMUSCULAR | Status: DC | PRN
  Administered 2023-12-21: 5 mg via INTRAVENOUS

## 2023-12-21 MED ORDER — CEFAZOLIN SODIUM-DEXTROSE 2-4 GM/100ML-% IV SOLN
2.0000 g | INTRAVENOUS | Status: AC
  Administered 2023-12-21: 2 g via INTRAVENOUS

## 2023-12-21 MED ORDER — LACTATED RINGERS IV SOLN: INTRAVENOUS | Status: DC

## 2023-12-21 MED ORDER — PHENYLEPHRINE HCL (PRESSORS) 10 MG/ML IV SOLN
INTRAVENOUS | Status: DC | PRN
Start: 2023-12-21 — End: 2023-12-21
  Administered 2023-12-21: 80 ug via INTRAVENOUS

## 2023-12-21 MED ORDER — ROPIVACAINE HCL 5 MG/ML IJ SOLN
INTRAMUSCULAR | Status: DC | PRN
  Administered 2023-12-21: 30 mL via PERINEURAL

## 2023-12-21 MED ORDER — FENTANYL CITRATE (PF) 100 MCG/2ML IJ SOLN
100.0000 ug | Freq: Once | INTRAMUSCULAR | Status: AC
  Administered 2023-12-21: 50 ug via INTRAVENOUS

## 2023-12-21 MED ORDER — GELATIN ABSORBABLE 100 EX MISC
CUTANEOUS | Status: DC | PRN
  Administered 2023-12-21: 1

## 2023-12-21 MED ORDER — FENTANYL CITRATE (PF) 100 MCG/2ML IJ SOLN
INTRAMUSCULAR | Status: AC
  Filled 2023-12-21: qty 2

## 2023-12-21 MED ORDER — PHENYLEPHRINE 80 MCG/ML (10ML) SYRINGE FOR IV PUSH (FOR BLOOD PRESSURE SUPPORT)
PREFILLED_SYRINGE | INTRAVENOUS | Status: AC
  Filled 2023-12-21: qty 10

## 2023-12-21 MED ORDER — PROPOFOL 500 MG/50ML IV EMUL
INTRAVENOUS | Status: DC | PRN
  Administered 2023-12-21: 125 ug/kg/min via INTRAVENOUS

## 2023-12-21 MED ORDER — SODIUM CHLORIDE 0.9 % IV SOLN: INTRAVENOUS | Status: DC | PRN

## 2023-12-21 MED ORDER — MIDAZOLAM HCL 2 MG/2ML IJ SOLN
INTRAMUSCULAR | Status: AC
  Filled 2023-12-21: qty 2

## 2023-12-21 MED ORDER — OXYCODONE HCL 5 MG PO TABS: 5.0000 mg | ORAL_TABLET | Freq: Four times a day (QID) | ORAL | 0 refills | Status: DC | PRN

## 2023-12-21 MED ORDER — PROPOFOL 10 MG/ML IV BOLUS
INTRAVENOUS | Status: AC
  Filled 2023-12-21: qty 20

## 2023-12-21 MED ORDER — LIDOCAINE-EPINEPHRINE (PF) 1 %-1:200000 IJ SOLN
INTRAMUSCULAR | Status: AC
  Filled 2023-12-21: qty 60

## 2023-12-21 MED ORDER — DEXAMETHASONE SODIUM PHOSPHATE 10 MG/ML IJ SOLN
INTRAMUSCULAR | Status: DC | PRN
  Administered 2023-12-21: 10 mg

## 2023-12-21 MED ORDER — ACETAMINOPHEN 10 MG/ML IV SOLN
INTRAVENOUS | Status: DC | PRN
  Administered 2023-12-21: 1000 mg via INTRAVENOUS

## 2023-12-21 MED ORDER — ONDANSETRON HCL 4 MG/2ML IJ SOLN
INTRAMUSCULAR | Status: AC
  Filled 2023-12-21: qty 2

## 2023-12-21 MED ORDER — CEFAZOLIN SODIUM-DEXTROSE 2-4 GM/100ML-% IV SOLN
INTRAVENOUS | Status: AC
Start: 2023-12-21 — End: ?
  Filled 2023-12-21: qty 100

## 2023-12-21 MED ORDER — ONDANSETRON HCL 4 MG/2ML IJ SOLN
INTRAMUSCULAR | Status: DC | PRN
  Administered 2023-12-21: 4 mg via INTRAVENOUS

## 2023-12-21 SURGICAL SUPPLY — 55 items
ANCHOR REPAIR HAND WRIST (Anchor) IMPLANT
BLADE MINI RND TIP GREEN BEAV (BLADE) ×1 IMPLANT
BLADE SURG 15 STRL LF DISP TIS (BLADE) ×2 IMPLANT
BNDG COHESIVE 4X5 TAN STRL LF (GAUZE/BANDAGES/DRESSINGS) ×2 IMPLANT
BNDG ELASTIC 4INX 5YD STR LF (GAUZE/BANDAGES/DRESSINGS) ×2 IMPLANT
BNDG ESMARK 4X9 LF (GAUZE/BANDAGES/DRESSINGS) ×1 IMPLANT
BNDG GAUZE DERMACEA FLUFF 4 (GAUZE/BANDAGES/DRESSINGS) ×1 IMPLANT
CHLORAPREP W/TINT 26 (MISCELLANEOUS) ×1 IMPLANT
CORD BIPOLAR FORCEPS 12FT (ELECTRODE) ×1 IMPLANT
COVER BACK TABLE 60X90IN (DRAPES) ×1 IMPLANT
CUFF TOURN SGL QUICK 18X4 (TOURNIQUET CUFF) ×1 IMPLANT
DERMABOND ADVANCED .7 DNX12 (GAUZE/BANDAGES/DRESSINGS) IMPLANT
DRAPE HAND 75INX146IN 110IN (DRAPES) ×1 IMPLANT
DRAPE OEC MINIVIEW 54X84 (DRAPES) ×1 IMPLANT
DRAPE SURG 17X23 STRL (DRAPES) ×1 IMPLANT
FIBERWIRE 0 DIAMOND POINT (SUTURE) IMPLANT
GAUZE SPONGE 4X4 12PLY STRL (GAUZE/BANDAGES/DRESSINGS) ×1 IMPLANT
GAUZE STRETCH 2X75IN STRL (MISCELLANEOUS) ×1 IMPLANT
GAUZE XEROFORM 1X8 LF (GAUZE/BANDAGES/DRESSINGS) ×1 IMPLANT
GLOVE BIO SURGEON STRL SZ7.5 (GLOVE) ×2 IMPLANT
GLOVE BIOGEL PI IND STRL 7.5 (GLOVE) ×1 IMPLANT
GOWN STRL REUS W/ TWL LRG LVL3 (GOWN DISPOSABLE) ×2 IMPLANT
GOWN STRL REUS W/TWL XL LVL3 (GOWN DISPOSABLE) IMPLANT
GOWN STRL SURGICAL XL XLNG (GOWN DISPOSABLE) ×1 IMPLANT
K-WIRE DBL .062X4 NSTRL (WIRE) ×1
KWIRE DBL .062X4 NSTRL (WIRE) ×1 IMPLANT
MANIFOLD NEPTUNE II (INSTRUMENTS) ×1 IMPLANT
NDL HYPO 25X1 1.5 SAFETY (NEEDLE) IMPLANT
NDL HYPO 25X5/8 SAFETYGLIDE (NEEDLE) IMPLANT
NDL SUT 6 .5 CRC .975X.05 MAYO (NEEDLE) IMPLANT
NEEDLE HYPO 25X1 1.5 SAFETY (NEEDLE) IMPLANT
NEEDLE HYPO 25X5/8 SAFETYGLIDE (NEEDLE) IMPLANT
NS IRRIG 1000ML POUR BTL (IV SOLUTION) ×1 IMPLANT
PACK BASIN DAY SURGERY FS (CUSTOM PROCEDURE TRAY) ×1 IMPLANT
PIN GUARD 1.57MM GREEN STERILE (PIN) ×1 IMPLANT
SHEET MEDIUM DRAPE 40X70 STRL (DRAPES) ×1 IMPLANT
SLEEVE SCD COMPRESS KNEE MED (STOCKING) ×1 IMPLANT
SLING ARM FOAM STRAP LRG (SOFTGOODS) IMPLANT
SPIKE FLUID TRANSFER (MISCELLANEOUS) IMPLANT
SPLINT PLASTER CAST XFAST 4X15 (CAST SUPPLIES) ×1 IMPLANT
SPONGE SURGIFOAM ABS GEL 12-7 (HEMOSTASIS) IMPLANT
STOCKINETTE IMPERVIOUS 9X36 MD (GAUZE/BANDAGES/DRESSINGS) ×1 IMPLANT
SUCTION TUBE FRAZIER 10FR DISP (SUCTIONS) ×1 IMPLANT
SUT 0 FIBERLOOP 38 BLUE TPR ND (SUTURE)
SUT ETHILON 4 0 PS 2 18 (SUTURE) ×1 IMPLANT
SUT MNCRL AB 3-0 PS2 27 (SUTURE) ×1 IMPLANT
SUT MNCRL AB 4-0 PS2 18 (SUTURE) ×1 IMPLANT
SUT VIC AB 3-0 PS2 18XBRD (SUTURE) ×1 IMPLANT
SUTURE 0 FIBERLP 38 BLU TPR ND (SUTURE) IMPLANT
SYR BULB EAR ULCER 3OZ GRN STR (SYRINGE) ×2 IMPLANT
SYR CONTROL 10ML LL (SYRINGE) IMPLANT
TAPE SUT LABRALTAP WHT/BLK (SUTURE) IMPLANT
TOWEL GREEN STERILE FF (TOWEL DISPOSABLE) ×2 IMPLANT
TUBE CONNECTING 20X1/4 (TUBING) ×1 IMPLANT
UNDERPAD 30X36 HEAVY ABSORB (UNDERPADS AND DIAPERS) ×1 IMPLANT

## 2023-12-21 NOTE — Interval H&P Note (Signed)
 History and Physical Interval Note:  12/21/2023 6:33 AM  Gloria Patterson  has presented today for surgery, with the diagnosis of LEFT THUMB CARPOMETACARPAL ARTHRITIS.  The various methods of treatment have been discussed with the patient and family. After consideration of risks, benefits and other options for treatment, the patient has consented to  Procedure(s): LEFT THUMB CARPOMETACARPAL ARTHROPLASTY WITH INTERNAL BRACE (Left) as a surgical intervention.  The patient's history has been reviewed, patient examined, no change in status, stable for surgery.  I have reviewed the patient's chart and labs.  Questions were answered to the patient's satisfaction.     Keian Odriscoll

## 2023-12-21 NOTE — Anesthesia Procedure Notes (Signed)
 Procedure Name: MAC Date/Time: 12/21/2023 7:32 AM  Performed by: Buster Catheryn SAUNDERS, CRNAPre-anesthesia Checklist: Patient identified, Emergency Drugs available, Suction available, Patient being monitored and Timeout performed Patient Re-evaluated:Patient Re-evaluated prior to induction Oxygen Delivery Method: Simple face mask Airway Equipment and Method: Oral airway Placement Confirmation: positive ETCO2

## 2023-12-21 NOTE — Op Note (Signed)
 NAME: Gloria Patterson [facility] MEDICAL RECORD NO: [id number] DATE OF BIRTH: August 18, 1951 FAC[facility] LOCATION: [facility] PHYSICIAN: GILDARDO ALDERTON, MD   OPERATIVE REPORT   DATE OF PROCEDURE: 12/21/23    PREOPERATIVE DIAGNOSIS: Left thumb carpometacarpal arthritis   POSTOPERATIVE DIAGNOSIS: Left thumb carpometacarpal arthritis   PROCEDURE: Left thumb carpometacarpal arthroplasty  Left tendon transfer abductor pollicis longus to flexor carpi radialis tendon   SURGEON:  Gildardo Alderton, M.D.   ASSISTANT: Joesph Dinsmore, OPA   ANESTHESIA:  Regional with sedation   INTRAVENOUS FLUIDS:  Per anesthesia flow sheet.   ESTIMATED BLOOD LOSS:  Minimal.   COMPLICATIONS:  None.   SPECIMENS:  none   TOURNIQUET TIME:    Total Tourniquet Time Documented: Upper Arm (Left) - 39 minutes Total: Upper Arm (Left) - 39 minutes    DISPOSITION:  Stable to PACU.   INDICATIONS: This is a 73 year old female who was seen in the outpatient setting and found to have left thumb carpometacarpal arthritis that was refractory to conservative care.  Patient was indicated for left thumb CMC arthroplasty.  There was no significant MP hyperextension notable preoperatively.  Risks and benefits of surgery were discussed including the risks of infection, bleeding, scarring, stiffness, nerve injury, vascular injury, tendon injury, need for subsequent operation, possible subsidence, recurrence.  She voiced understanding of these risks and elected to proceed.  OPERATIVE COURSE: Patient was seen and identified in the preoperative area and marked appropriately.  Surgical consent had been signed. Preoperative IV antibiotic prophylaxis was given. She was transferred to the operating room and placed in supine position with the Left upper extremity on an arm board.  Sedation was induced by the anesthesiologist. A regional block had been performed by anesthesia in preoperative holding.    Left upper  extremity was prepped and draped in normal sterile orthopedic fashion.  A surgical pause was performed between the surgeons, anesthesia, and operating room staff and all were in agreement as to the patient, procedure, and site of procedure.  Tourniquet was placed and padded appropriately to the left upper arm.  A straight line incision was made over the dorsum of the left thumb CMC joint at the glabrous/nonglabrous junction.  Crossing branches of the radial sensory nerve were identified and carefully protected.  The radial artery was identified and protected.  The first extensor compartment was first released.  The tendons of the first tensor compartment were identified within the incisional site.  The dorsal most aspect of the tendon sheath was then released and carried down over the radial styloid.  Following release of the first extensor compartment, CMC arthrotomy was completed.  Thick capsular flaps were elevated both radially and ulnarly.  The trapezium was identified and confirmed with biplanar fluoroscopy.  This was carefully freed and dissected, then removed utilizing a rongeur.  Care was taken to avoid injury to the underlying flexor carpi radialis tendon.  Following complete trapeziectomy, the scaphoid trapezoid joint was inspected.  There was noted to be some cartilage loss and degenerative changes at the joint involving the articular surface of the trapezoid, consequently a partial excision of the trapezoid was performed.  At this point, the Arthrex internal brace was utilized.  Base of the index metacarpal was identified utilizing biplanar fluoroscopy, the K wire was inserted into the nonarticular portion followed by drill and the first suture anchor.  Biplanar fluoroscopy was utilized to confirm appropriate positioning of the wire prior to anchor placement.  Stability was noted to this suture anchor.  Subsequently, the thumb metacarpal base was identified followed by placement of the  additional suture anchor and suspension of the internal brace.  Thumb was held in gently abducted position for internal brace placement.  Biplanar fluoroscopy was utilized to confirm appropriate thumb metacarpal positioning, no significant subsidence was noted with gentle stress testing.  At this stage in the procedure, the FCR tendon was identified and the depth of the wound.  The APL tendons were identified at their distal extent overlying the base the metacarpal.  An 0 FiberWire was utilized to create a tendon transfer between the APL tendon slips and the FCR with traction on the thumb.  Excellent stability was noted.  The void created by the trapeziectomy was filled with a Gelfoam.  The capsular flaps were repaired utilizing 3-0 Vicryl suture in figure-of-eight fashion.  The tourniquet was deflated at 39 minutes.  Fingertips were pink with brisk capillary refill after deflation of tourniquet.  Copious irrigation was performed and bipolar electrocautery was used for hemostasis.  Incision was closed in layers utilizing 3-0 Vicryl for the subcutaneous tissue and a 4-0 running Monocryl for the skin surface.  Dermabond was placed.  Sterile dressings were provided followed by application of a thumb spica splint utilizing plaster.  The operative drapes were broken down.  The patient was awoken from anesthesia safely and taken to PACU in stable condition.  I will see her back in the office in 2 weeks for postoperative followup.  Postoperative discharge instructions were provided as well as postoperative pain medication be utilized as needed.    Gloria Wortley, MD Electronically signed, 12/21/23

## 2023-12-21 NOTE — Progress Notes (Signed)
Assisted Dr. Desmond Lope with left, supraclavicular, ultrasound guided block. Side rails up, monitors on throughout procedure. See vital signs in flow sheet. Tolerated Procedure well.

## 2023-12-21 NOTE — Anesthesia Preprocedure Evaluation (Signed)
 Anesthesia Evaluation  Patient identified by MRN, date of birth, ID band Patient awake    Reviewed: Allergy & Precautions, NPO status , Patient's Chart, lab work & pertinent test results, reviewed documented beta blocker date and time   Airway Mallampati: II  TM Distance: >3 FB Neck ROM: Full    Dental  (+) Dental Advisory Given, Edentulous Lower, Edentulous Upper   Pulmonary asthma    Pulmonary exam normal breath sounds clear to auscultation       Cardiovascular hypertension, Pt. on medications and Pt. 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  Reproductive/Obstetrics                              Anesthesia Physical Anesthesia Plan  ASA: 3  Anesthesia Plan: Regional   Post-op Pain Management: Regional block* and Ofirmev  IV (intra-op)*   Induction: Intravenous  PONV Risk Score and Plan: 2 and Dexamethasone , Ondansetron  and TIVA  Airway Management Planned: Natural Airway and Simple Face Mask  Additional Equipment:   Intra-op Plan:   Post-operative Plan:   Informed Consent: I have reviewed the patients History and Physical, chart, labs and discussed the procedure including the risks, benefits and alternatives for the proposed anesthesia with the patient or authorized representative who has indicated his/her understanding and acceptance.     Dental advisory given  Plan Discussed with: CRNA  Anesthesia Plan Comments:           Anesthesia Quick Evaluation

## 2023-12-21 NOTE — Discharge Instructions (Addendum)
 Hand Surgery Postop Instructions   Dressings: Maintain postoperative dressing until orthopedic follow-up.  Keep operative site clean and dry until orthopedic follow-up.  Wound Care: Keep your hand elevated above the level of your heart.  Do not allow it to dangle by your side. Moving your fingers is advised to stimulate circulation but will depend on the site of your surgery.  If you have a splint applied, your doctor will advise you regarding movement.  Activity: Do not drive or operate machinery until clearance given from physician. No heavy lifting with operative extremity.  Diet:  Drink liquids today or eat a light diet.  You may resume a regular diet tomorrow.    General expectations: Take prescribed medication if given, transition to over-the-counter medication as quickly as possible. Fingers may become slightly swollen.  Call your doctor if any of the following occur: Severe pain not relieved by pain medication. Elevated temperature. Dressing soaked with blood. Inability to move fingers. White or bluish color to fingers.   Per [facility] clinic policy, our goal is ensure optimal postoperative pain control with a multimodal pain management strategy. For all [facility] patients, our goal is to wean post-operative narcotic medications by 6 weeks post-operatively. If this is not possible due to utilization of pain medication prior to surgery, your [facility] doctor will support your acute post-operative pain control for the first 6 weeks postoperatively, with a plan to transition you back to your primary pain team following that. Maralee will work to ensure a therapist, occupational.  Anshul Afton Alderton, M.D. Hand Surgery [facility]     May take Tylenol  after 2pm, if needed.    Post Anesthesia Home Care Instructions  Activity: Get plenty of rest for the remainder of the day. A responsible individual must stay with you for 24 hours following the procedure.   For the next 24 hours, DO NOT: -Drive a car -Advertising copywriter -Drink alcoholic beverages -Take any medication unless instructed by your physician -Make any legal decisions or sign important papers.  Meals: Start with liquid foods such as gelatin or soup. Progress to regular foods as tolerated. Avoid greasy, spicy, heavy foods. If nausea and/or vomiting occur, drink only clear liquids until the nausea and/or vomiting subsides. Call your physician if vomiting continues.  Special Instructions/Symptoms: Your throat may feel dry or sore from the anesthesia or the breathing tube placed in your throat during surgery. If this causes discomfort, gargle with warm salt water . The discomfort should disappear within 24 hours.  If you had a scopolamine patch placed behind your ear for the management of post- operative nausea and/or vomiting:  1. The medication in the patch is effective for 72 hours, after which it should be removed.  Wrap patch in a tissue and discard in the trash. Wash hands thoroughly with soap and water . 2. You may remove the patch earlier than 72 hours if you experience unpleasant side effects which may include dry mouth, dizziness or visual disturbances. 3. Avoid touching the patch. Wash your hands with soap and water  after contact with the patch.   Regional Anesthesia Blocks  1. You may not be able to move or feel the blocked extremity after a regional anesthetic block. This may last may last from 3-48 hours after placement, but it will go away. The length of time depends on the medication injected and your individual response to the medication. As the nerves start to wake up, you may experience tingling as the movement and feeling returns  to your extremity. If the numbness and inability to move your extremity has not gone away after 48 hours, please call your surgeon.   2. The extremity that is blocked will need to be protected until the numbness is gone and the strength has  returned. Because you cannot feel it, you will need to take extra care to avoid injury. Because it may be weak, you may have difficulty moving it or using it. You may not know what position it is in without looking at it while the block is in effect.  3. For blocks in the legs and feet, returning to weight bearing and walking needs to be done carefully. You will need to wait until the numbness is entirely gone and the strength has returned. You should be able to move your leg and foot normally before you try and bear weight or walk. You will need someone to be with you when you first try to ensure you do not fall and possibly risk injury.  4. Bruising and tenderness at the needle site are common side effects and will resolve in a few days.  5. Persistent numbness or new problems with movement should be communicated to the surgeon or the [facility] [phone] [facility] [phone]).

## 2023-12-21 NOTE — Transfer of Care (Signed)
 Immediate Anesthesia Transfer of Care Note  Patient: Gloria Patterson  Procedure(s) Performed: LEFT THUMB CARPOMETACARPAL ARTHROPLASTY WITH INTERNAL BRACE (Left)  Patient Location: PACU  Anesthesia Type:MAC combined with regional for post-op pain  Level of Consciousness: awake, alert , and oriented  Airway & Oxygen Therapy: Patient Spontanous Breathing and Patient connected to face mask oxygen  Post-op Assessment: VSS  Post vital signs: Reviewed and stable  Last Vitals:  Vitals Value Taken Time  BP 127/60 12/21/23 0901  Temp    Pulse 67 12/21/23 0904  Resp 13 12/21/23 0904  SpO2 97 % 12/21/23 0904  Vitals shown include unfiled device data.  Last Pain:  Vitals:   12/21/23 0624  TempSrc: Temporal  PainSc: 9       Patients Stated Pain Goal: 3 (12/21/23 9375)  Complications: No notable events documented.

## 2023-12-21 NOTE — Anesthesia Postprocedure Evaluation (Signed)
 Anesthesia Post Note  Patient: Gloria Patterson  Procedure(s) Performed: LEFT THUMB CARPOMETACARPAL ARTHROPLASTY WITH INTERNAL BRACE (Left)     Patient location during evaluation: PACU Anesthesia Type: Regional Level of consciousness: awake and alert Pain management: pain level controlled Vital Signs Assessment: post-procedure vital signs reviewed and stable Respiratory status: spontaneous breathing, nonlabored ventilation and respiratory function stable Cardiovascular status: stable and blood pressure returned to baseline Postop Assessment: no apparent nausea or vomiting Anesthetic complications: no   No notable events documented.  Last Vitals:  Vitals:   12/21/23 0922 12/21/23 0939  BP: (!) 138/59 (!) 135/58  Pulse: 63 60  Resp: 14 16  Temp:  (!) 36.1 C  SpO2: 94% 98%    Last Pain:  Vitals:   12/21/23 0939  TempSrc:   PainSc: 0-No pain                 Garnette FORBES Skillern

## 2023-12-21 NOTE — Anesthesia Procedure Notes (Signed)
 Anesthesia Regional Block: Supraclavicular block   Pre-Anesthetic Checklist: , timeout performed,  Correct Patient, Correct Site, Correct Laterality,  Correct Procedure, Correct Position, site marked,  Risks and benefits discussed,  Surgical consent,  Pre-op evaluation,  At surgeon's request and post-op pain management  Laterality: Left  Prep: chloraprep       Needles:  Injection technique: Single-shot  Needle Type: Echogenic Needle     Needle Length: 9cm  Needle Gauge: 21     Additional Needles:   Procedures:,,,, ultrasound used (permanent image in chart),,    Narrative:  Start time: 12/21/2023 7:15 AM End time: 12/21/2023 7:23 AM Injection made incrementally with aspirations every 5 mL.  Performed by: Personally  Anesthesiologist: Corinne Garnette BRAVO, MD  Additional Notes: No pain on injection. No increased resistance to injection. Injection made in 5cc increments.  Good needle visualization.  Patient tolerated procedure well.

## 2023-12-24 ENCOUNTER — Encounter (HOSPITAL_BASED_OUTPATIENT_CLINIC_OR_DEPARTMENT_OTHER): Payer: Self-pay | Admitting: Orthopedic Surgery

## 2023-12-26 ENCOUNTER — Other Ambulatory Visit: Payer: Self-pay | Admitting: Orthopedic Surgery

## 2023-12-26 ENCOUNTER — Telehealth: Payer: Self-pay

## 2023-12-26 MED ORDER — OXYCODONE HCL 5 MG PO TABS: 5.0000 mg | ORAL_TABLET | Freq: Four times a day (QID) | ORAL | 0 refills | Status: DC | PRN

## 2023-12-26 NOTE — Telephone Encounter (Signed)
 Spoke with patient; we will call in another prescription, half the dose she already got.

## 2023-12-26 NOTE — Telephone Encounter (Signed)
 Patient would like a Rx refill sent to her pharmacy.  Patient had left thumb surgery on 12/21/2023.  CB# [phone].  Please advise.

## 2024-01-02 ENCOUNTER — Other Ambulatory Visit: Payer: Self-pay | Admitting: Orthopedic Surgery

## 2024-01-02 ENCOUNTER — Ambulatory Visit (INDEPENDENT_AMBULATORY_CARE_PROVIDER_SITE_OTHER): Payer: No Typology Code available for payment source

## 2024-01-02 ENCOUNTER — Ambulatory Visit: Payer: No Typology Code available for payment source | Attending: Orthopedic Surgery | Admitting: Occupational Therapy

## 2024-01-02 ENCOUNTER — Other Ambulatory Visit: Payer: Self-pay

## 2024-01-02 ENCOUNTER — Ambulatory Visit: Payer: Medicaid Other | Admitting: Orthopedic Surgery

## 2024-01-02 DIAGNOSIS — R278 Other lack of coordination: Secondary | ICD-10-CM | POA: Diagnosis present

## 2024-01-02 DIAGNOSIS — M1812 Unilateral primary osteoarthritis of first carpometacarpal joint, left hand: Secondary | ICD-10-CM

## 2024-01-02 DIAGNOSIS — M25542 Pain in joints of left hand: Secondary | ICD-10-CM

## 2024-01-02 DIAGNOSIS — M25642 Stiffness of left hand, not elsewhere classified: Secondary | ICD-10-CM | POA: Diagnosis present

## 2024-01-02 DIAGNOSIS — M6281 Muscle weakness (generalized): Secondary | ICD-10-CM

## 2024-01-02 DIAGNOSIS — R29898 Other symptoms and signs involving the musculoskeletal system: Secondary | ICD-10-CM

## 2024-01-02 MED ORDER — NAPROXEN 500 MG PO TABS: 500.0000 mg | ORAL_TABLET | Freq: Two times a day (BID) | ORAL | 1 refills | Status: DC | PRN

## 2024-01-02 NOTE — Progress Notes (Signed)
   SINCLAIRE KOSIN - 73 y.o. female MRN [id number]  Date of birth: 1951-02-27  Office Visit Note: Visit Date: 01/02/2024 PCP: Valerie Gather, MD Referred by: Valerie Gather,*  Subjective:  HPI: KALLIOPE LINCECUM is a 73 y.o. female who presents today for follow up 2 weeks status post left thumb cmc arthroplasty with internal brace.  She is doing well overall, pain is controlled.  Has been compliant with the splint as instructed.  Pertinent ROS were reviewed with the patient and found to be negative unless otherwise specified above in HPI.   Assessment & Plan: Visit Diagnoses:  1. Arthritis of carpometacarpal (CMC) joint of left thumb     Plan: She is doing well postoperatively.  X-rays obtained today show stable appearance of the thumb metacarpal status post trapeziectomy.  She will be seen by occupational therapy today for fabrication of a removable thumb spica orthosis.  Begin range of motion at 4 weeks postop per protocol.  Wait until week 8 to begin strengthening.  I will plan on seeing her back in 4 weeks to track her progress.  Follow-up: No follow-ups on file.   Meds & Orders: No orders of the defined types were placed in this encounter.   Orders Placed This Encounter  Procedures   XR Wrist Complete Left     Procedures: No procedures performed       Objective:   Vital Signs: There were no vitals taken for this visit.  Ortho Exam Left wrist: - Well-healed incision at the glabrous/nonglabrous juncture over the [facility] region of the thumb - Thumb circumduction without significant pain or crepitus - Hand is warm well-perfused, sensation intact in all distributions including DRSN - Thumb pinch strength not tested   Imaging: XR Wrist Complete Left Result Date: 01/02/2024 X-rays of the left wrist demonstrate stable appearance of the thumb metacarpal status post trapeziectomy, no evidence of subsidence.    Leightyn Cina Alvia Jointer, M.D. [facility] 10:55 AM

## 2024-01-02 NOTE — Patient Instructions (Addendum)
 Access Code: [email or web address] URL: [email or web address] Date: 01/02/2024 Prepared by: Sudie Ely  Exercises - Seated Thumb IP Flexion AROM with Blocking  - 1 x daily - 7 x weekly - 3 sets - 10 reps - Finger O  - 1 x daily - 7 x weekly - 3 sets - 10 reps - Wrist AROM Flexion Extension  - 1 x daily - 7 x weekly - 3 sets - 10 reps - Anterior Wrist Scar Massage  - 1 x daily - 7 x weekly - 3 sets - 10 reps    Splint: WEARING SCHEDULE:  Wear splint at ALL times except for hygiene. Remove your splint for your exercises, keep your forearm supported on a table or on pillows on your lap as discussed. When you are finished with your exercises, put the splint back on and do not use your hand for any functional activity (do not lift, push, pull or pick items up with your left hand at this time).    PURPOSE:  To prevent movement and for protection to your left thumb until injury can heal   CARE OF SPLINT:  Keep splint away from heat sources including: stove, radiator or furnace, or a car in sunlight. The splint can melt and will no longer fit you properly   Keep away from pets and children   Clean the splint with rubbing alcohol as needed.  * During this time, make sure you also clean your hand/arm as instructed by your therapist and/or perform dressing changes as needed. Then dry hand/arm completely before replacing splint. (When cleaning hand/arm, keep it immobilized in same position until splint is replaced). Keep your hand elevated to help with swelling.   PRECAUTIONS/POTENTIAL PROBLEMS: *If you notice or experience increased pain, swelling, numbness, or a lingering reddened area from the splint: Contact your therapist immediately by calling [phone]. You must wear the splint for protection, but we will get you scheduled for adjustments as quickly as possible.  (If only straps or hooks need to be replaced and NO adjustments to the splint need to be made, just call the office ahead and  let them know you are coming in)   If you have any medical concerns or signs of infection, please call your doctor immediately.

## 2024-01-02 NOTE — Therapy (Signed)
OUTPATIENT OCCUPATIONAL THERAPY ORTHO EVALUATION  Patient Name: Gloria Patterson MRN: [id number] DOB:08/16/1951, 73 y.o., female Today's Date: 01/02/2024  PCP: Melvenia Beam, MD REFERRING PROVIDER: Samuella Cota, MD  END OF SESSION:  OT End of Session - 01/02/24 1117     Visit Number 1    Number of Visits 20    Date for OT Re-Evaluation 03/29/23    Authorization Type [employer] 2025/Medicaid No Copay/[place] VL: [place] Auth Not Reqd    OT Start Time 1230    OT Stop Time 1400    OT Time Calculation (min) 90 min    Equipment Utilized During Treatment Thermoplastic splinting material    Activity Tolerance Patient tolerated treatment well;Patient limited by pain    Behavior During Therapy WFL for tasks assessed/performed;Restless             Past Medical History:  Diagnosis Date   Arthritis    knee, shouder   Asthma    MILD   Chronic cough    Constipation    Diabetes mellitus without complication (HCC)    Type II   Frequency of urination    GERD (gastroesophageal reflux disease)    Hepatitis    Hepatitis C treated    History of hepatitis DX 1973  SECONDARY TO DRUG ABUSE--  UNKNOWN TYPE PER PT--  WAS TX'D    NO ISSURES OR S & S SINCE   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  Patient not sure why she was given Phenobarital,   " before 1978"   Urgency of urination    Vaginal vault prolapse    ANTERIOR   Past Surgical History:  Procedure Laterality Date   ANTERIOR AND POSTERIOR REPAIR N/A 03/10/2013   Procedure: ANTERIOR (CYSTOCELE) AND POSTERIOR REPAIR (RECTOCELE);  Surgeon: Kathi Ludwig, MD;  Location: [facility];  Service: Urology;  Laterality: N/A;   [employer] UPHOLD LITE ANTERIOR VAULT REPAIR  POSSIBLE OP WITH OBSERVATION POSSIBLE FLOOR BED  Uphold LITE w/[employer] number [id number] qty 2 Xenform soft tissue repair matrix 6x7 cm [id number]    BREAST REDUCTION SURGERY     BREAST SURGERY     B/L removal of benign cysts   CARPOMETACARPEL SUSPENSION PLASTY Left 12/21/2023   Procedure: LEFT THUMB CARPOMETACARPAL ARTHROPLASTY WITH INTERNAL BRACE;  Surgeon: Samuella Cota, MD;  Location: [facility];  Service: Orthopedics;  Laterality: Left;   COLONOSCOPY W/ POLYPECTOMY     CYSTOCELE REPAIR N/A 03/10/2013   Procedure:  Anterior vaginal vault prolapse repair, with [employer] uphold light sacrospinous fixation using U. device and mesh repair with [name] plication   ;  Surgeon: Kathi Ludwig, MD;  Location: [facility];  Service: Urology;  Laterality: N/A;   ESOPHAGOGASTRODUODENOSCOPY     TOTAL KNEE ARTHROPLASTY Left 04/08/2013   Procedure: TOTAL KNEE ARTHROPLASTY;  Surgeon: Cammy Copa, MD;  Location: [facility];  Service: Orthopedics;  Laterality: Left;  Left total knee arthroplasty   TOTAL KNEE ARTHROPLASTY Right 05/31/2017   Procedure: RIGHT TOTAL KNEE ARTHROPLASTY;  Surgeon: Cammy Copa, MD;  Location: [facility] OR;  Service: Orthopedics;  Laterality: Right;   TUBAL LIGATION     Patient Active Problem List   Diagnosis Date Noted   Arthritis of carpometacarpal (CMC)  joint of left thumb 10/18/2023   Arthritis of carpometacarpal [facility] joint of both thumbs 07/19/2023   OA (osteoarthritis) of shoulder 11/23/2022   HCAP (healthcare-associated pneumonia) 06/03/2017   Sepsis (HCC) 06/03/2017   S/P TKR (total knee replacement), right 06/03/2017   Type 2 diabetes mellitus without complication (HCC) 06/03/2017   Arthritis of knee 05/31/2017   Essential hypertension 08/25/2013   GERD (gastroesophageal reflux disease) 05/23/2013   Narcotic withdrawal (HCC) 05/23/2013   Acute bronchitis 05/23/2013   Osteoarthritis of left knee 04/08/2013    Class: Diagnosis of   Upper airway cough syndrome 10/16/2012    ONSET  DATE: 12/21/2023 (surgery)  REFERRING DIAG: M18.12 (ICD-10-CM) - Arthritis of carpometacarpal (CMC) joint of left thumb Splint fabrication/ OT in 2 weeks. Her PO is scheduled for 01/02/24; so on this day preferably  S/p [facility] arthroplasty  THERAPY DIAG:  Pain in joint of left hand  Stiffness of left hand, not elsewhere classified  Muscle weakness (generalized)  Other lack of coordination  Other symptoms and signs involving the musculoskeletal system  Osteoarthritis of left thumb  Rationale for Evaluation and Treatment: Rehabilitation  SUBJECTIVE:   SUBJECTIVE STATEMENT: Pt arrived early for OT visit today but had to wait until scheduled appt time nearly 90 minutes later due to therapist's schedule with other pts.  She reported that she had not taken any pain medicine this morning and was hurting at 9/10.  Pt accompanied by: self and friend - Carlton   PERTINENT HISTORY: DM2, HTN, GERD, arthritis CMC L thumb, OA shoulder, s/p R TKR   PRECAUTIONS: Fall  RED FLAGS: None   WEIGHT BEARING RESTRICTIONS:  LUE limitations s/p arthroplasty - splint in place except for exercises  PAIN:  Are you having pain? Yes: NPRS scale: 9 Pain location: thumb and up the L arm Pain description: throbbing Aggravating factors: moving Relieving factors: pain medicine  FALLS: Has patient fallen in last 6 months? No  LIVING ENVIRONMENT: Lives with: lives alone but grandson has been staying with her Lives in: House/apartment Stairs: No - has elevator Has following equipment at home: Single point cane, Walker - 2 wheeled, and Grab bars  PLOF: Independent - was driving before this surgery  PATIENT GOALS: Get to where I was before and use my hand better.  NEXT MD VISIT: return in 1 month - 01/30/24  OBJECTIVE:  Note: Objective measures were completed at Evaluation unless otherwise noted.  HAND DOMINANCE: Right  ADLs: Overall ADLs: Pt reports she has been getting help from her  grandchildren since her surgery Upper body dressing: Assistance to get clothing on with L UE limitations. Lower body dressing: Assistance to adjust clothing and get them over her feet. Toileting: Assistance to adjust clothing. Bathing: Assistance from family for thoroughness.  FUNCTIONAL OUTCOME MEASURES: Quick Dash: 84.1  UPPER EXTREMITY ROM:    ROM - TBA  Active ROM Right eval Left eval  Thumb MCP (0-60) WFL TBA     Thumb IP (0-80)    Thumb Radial abd/add (0-55)    Thumb Palmar abd/add (0-45)    Thumb Opposition to Small Finger     Index MCP (0-90)     Index PIP (0-100)     Index DIP (0-70)     Long MCP (0-90)     Long PIP (0-100)     Long DIP (0-70)     Ring MCP (0-90)     Ring PIP (0-100)     Ring DIP (0-70)     Little  MCP (0-90)     Little PIP (0-100)     Little DIP (0-70)     (Blank rows = not tested)   UPPER EXTREMITY MMT:     BUE strength - TBA  HAND FUNCTION: TBA  COORDINATION: TBA  SENSATION: Not tested -pt reports some mild tingling and numbness of L thumb  EDEMA: L thumb  COGNITION: Overall cognitive status: Within functional limits for tasks assessed  OBSERVATIONS: Pt arrived with L arm in a sling and ambulated with no AE and no loss of balance. Pt hummed often throughout session to comfort herself.    TREATMENT DATE:                                                                                                                         01/02/24: Pt was fitted with a left custom fabricated thumb spica splint placing her left thumb in slight ABD and rotation, or the functional position following a left thumb CMC arthroplasty on 12/21/23. Pt was educated to wear the splint at all times for protection, she was educated in splinting use, care and precautions. She was educated in the HEP listed below as per [place] Hand Protocol following L CMC arthroplasty. She was given verbal instruction, demonstration/performance in the clinic as well as,  written/handouts for thumb IP flexion and finger opposition in her splint and wrist flexion/extension out of splint.  She needed max cues to minimize ulnar drift of wrist and digits. She verbalized understanding in the clinic today.   Unable to initiate scar massage at this time but pt instructed to perform handwashing when she removes her splint.   PATIENT EDUCATION: Education details: OT role, POC considerations, splint wear and initial HEP Person educated: Patient Education method: Explanation, Demonstration, Tactile cues, Verbal cues, and Handouts Education comprehension: verbalized understanding, returned demonstration, verbal cues required, tactile cues required, and needs further education  HOME EXERCISE PROGRAM: 01/02/24 - Initial HEP - Wrist AROM, thumb IP and finger opposition in splint - Access Code: [email or web address]   GOALS: Goals reviewed with patient? Yes  SHORT TERM GOALS: Target date: 02/15/24  Pt will obtain protective, custom forearm based thumb spica splint/orthotic. Baseline: Custom forearm thumb spica splint fabricated 01/02/24 Goal status: IN Progress  2.  Pt will demo/state understanding of initial HEP to improve pain levels and prerequisite motion for functional use of her left hand/thumb. Baseline: Initiated HEP at eval Goal status: IN Progress  3. Pt will be independent with scar massage techniques for max ROM of skin around thumb surgery site.  Baseline: Unable to begin at eval  Goal Status: INITIAL   LONG TERM GOALS: Target date: 03/27/24  Pt will improve functional ability by decreased impairment per Quick DASH assessment from 84.1% to <50% or better, for better quality of life. Baseline: 84.1% Goal status: INITIAL  2.  Pt will improve grip strength in left hand from unable to assess to at least 15 lbs for functional use at home and in  IADLs. Baseline: Unable to assess Goal status: INITIAL  3.  Pt will improve A/ROM in left thumb from opposition to tip of  index finger on left to at least opposition to PIP (or greater) of left small finger, to have functional motion for tasks like ADL's, reach and grasp.  Baseline: Immobilized Goal status: INITIAL  4.  Pt will improve left hand functional use from unable to assess to at least be Mod I for basic ADL's (ie, brushing her hair, bathing, brushing her teeth and folding laundry) to assist in her ability to carry out selfcare and higher-level homecare tasks with less difficulty. Baseline: Assistance from family with ADLs Goal status: INITIAL  5.  Pt will improve coordination skills in left hand, as seen by better score on 9 hole peg testing to have increased functional ability to carry out fine motor tasks (fasteners, etc.) and more complex, coordinated IADLs (meal prep, sports, etc.).  Baseline: Unable to assist Goal status: INITIAL  6.  Pt will decrease pain at worst from 9/10 to <4/10 or less to have better sleep and occupational participation in daily roles. Baseline: 9/10 Goal status: INITIAL  ASSESSMENT:  CLINICAL IMPRESSION: Patient is a 73 y.o. right HD female who was seen today for occupational therapy evaluation following Left thumb CMC arthroplasty Dr Lajuan Lines Fara Boros) [place] on 12/21/23. She is currently 12 days post-op. Pt was fitted with a left custom fabricated forearm based thumb spica splint placing her left thumb in slight ABD and rotation, functional position following a left thumb CMC arthroplasty. Pt was educated to wear the splint at all times for protection, she was educated in splinting use, care and precautions. She was educated in HEP as per [employer] Protocol following L CMC arthroplasty. She was given verbal, written/handout instructions and verbalized understanding in the clinic today.  She will benefit from out-pt OT to address deficits in A/ROM, decreased functional use of left hand/thumb, pain, pt education, scar management/desensitization, splinting, edema control and overall  functional use and strengthening of her left dominant hand.      PERFORMANCE DEFICITS: in functional skills including ADLs, IADLs, coordination, dexterity, sensation, edema, tone, ROM, strength, pain, fascial restrictions, flexibility, Fine motor control, Gross motor control, decreased knowledge of precautions, decreased knowledge of use of DME, wound, skin integrity, and UE functional use, cognitive skills including problem solving, and psychosocial skills including coping strategies, environmental adaptation, and routines and behaviors.   IMPAIRMENTS: are limiting patient from ADLs, IADLs, rest and sleep, leisure, and social participation.   COMORBIDITIES: may have co-morbidities  that affects occupational performance. Patient will benefit from skilled OT to address above impairments and improve overall function.  MODIFICATION OR ASSISTANCE TO COMPLETE EVALUATION: Min-Moderate modification of tasks or assist with assess necessary to complete an evaluation.  OT OCCUPATIONAL PROFILE AND HISTORY: Detailed assessment: Review of records and additional review of physical, cognitive, psychosocial history related to current functional performance.  CLINICAL DECISION MAKING: Moderate - several treatment options, min-mod task modification necessary  REHAB POTENTIAL: Good  EVALUATION COMPLEXITY: Moderate      PLAN:  OT FREQUENCY: 1-2x/week  OT DURATION: 12 weeks  PLANNED INTERVENTIONS: 97535 self care/ADL training, 96295 therapeutic exercise, 97530 therapeutic activity, 97112 neuromuscular re-education, 97140 manual therapy, 97035 ultrasound, 97018 paraffin, 28413 fluidotherapy, 97010 moist heat, 97760 Splinting (initial encounter), [id number] Subsequent splinting/medication, scar mobilization, passive range of motion, coping strategies training, patient/family education, and DME and/or AE instructions  RECOMMENDED OTHER SERVICES: NA  CONSULTED AND AGREED WITH PLAN OF CARE:  Patient  PLAN FOR NEXT  SESSION:  Check splint, add padding as needed Check and update HEP as protocol allows Introduce scar massage when appropriate    Victorino Sparrow, OT 01/02/2024, 4:35 PM

## 2024-01-06 ENCOUNTER — Other Ambulatory Visit: Payer: Self-pay | Admitting: Family Medicine

## 2024-01-08 ENCOUNTER — Ambulatory Visit: Payer: No Typology Code available for payment source | Admitting: Occupational Therapy

## 2024-01-08 DIAGNOSIS — M25642 Stiffness of left hand, not elsewhere classified: Secondary | ICD-10-CM

## 2024-01-08 DIAGNOSIS — M6281 Muscle weakness (generalized): Secondary | ICD-10-CM

## 2024-01-08 DIAGNOSIS — M1812 Unilateral primary osteoarthritis of first carpometacarpal joint, left hand: Secondary | ICD-10-CM

## 2024-01-08 DIAGNOSIS — M25542 Pain in joints of left hand: Secondary | ICD-10-CM

## 2024-01-08 DIAGNOSIS — R278 Other lack of coordination: Secondary | ICD-10-CM

## 2024-01-08 NOTE — Therapy (Signed)
OUTPATIENT OCCUPATIONAL THERAPY ORTHO EVALUATION  Patient Name: Gloria Patterson MRN: [id number] DOB:1951/03/26, 73 y.o., female Today's Date: 01/08/2024  PCP: Melvenia Beam, MD REFERRING PROVIDER: Samuella Cota, MD  END OF SESSION:  OT End of Session - 01/08/24 1226     Visit Number 2    Number of Visits 20    Date for OT Re-Evaluation 03/29/23    Authorization Type [employer] 2025/Medicaid No Copay/[place] VL: [place] Auth Not Reqd    OT Start Time 1230    OT Stop Time 1315    OT Time Calculation (min) 45 min    Equipment Utilized During Treatment splint    Activity Tolerance Patient tolerated treatment well;Patient limited by pain    Behavior During Therapy WFL for tasks assessed/performed             Past Medical History:  Diagnosis Date   Arthritis    knee, shouder   Asthma    MILD   Chronic cough    Constipation    Diabetes mellitus without complication (HCC)    Type II   Frequency of urination    GERD (gastroesophageal reflux disease)    Hepatitis    Hepatitis C treated    History of hepatitis DX 1973  SECONDARY TO DRUG ABUSE--  UNKNOWN TYPE PER PT--  WAS TX'D    NO ISSURES OR S & S SINCE   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  Patient not sure why she was given Phenobarital,   " before 1978"   Urgency of urination    Vaginal vault prolapse    ANTERIOR   Past Surgical History:  Procedure Laterality Date   ANTERIOR AND POSTERIOR REPAIR N/A 03/10/2013   Procedure: ANTERIOR (CYSTOCELE) AND POSTERIOR REPAIR (RECTOCELE);  Surgeon: Kathi Ludwig, MD;  Location: [facility];  Service: Urology;  Laterality: N/A;   BO[employer]ON [employer]IFIC UPHOLD LITE ANTERIOR VAULT REPAIR  POSSIBLE OP WITH OBSERVATION POSSIBLE FLOOR BED  Uphold LITE w/[employer] number [id number] qty  2 Xenform soft tissue repair matrix 6x7 cm [id number]    BREAST REDUCTION SURGERY     BREAST SURGERY     B/L removal of benign cysts   CARPOMETACARPEL SUSPENSION PLASTY Left 12/21/2023   Procedure: LEFT THUMB CARPOMETACARPAL ARTHROPLASTY WITH INTERNAL BRACE;  Surgeon: Samuella Cota, MD;  Location: [facility];  Service: Orthopedics;  Laterality: Left;   COLONOSCOPY W/ POLYPECTOMY     CYSTOCELE REPAIR N/A 03/10/2013   Procedure:  Anterior vaginal vault prolapse repair, with [employer] uphold light sacrospinous fixation using U. device and mesh repair with [name] plication   ;  Surgeon: Kathi Ludwig, MD;  Location: [facility];  Service: Urology;  Laterality: N/A;   ESOPHAGOGASTRODUODENOSCOPY     TOTAL KNEE ARTHROPLASTY Left 04/08/2013   Procedure: TOTAL KNEE ARTHROPLASTY;  Surgeon: Cammy Copa, MD;  Location: [facility] OR;  Service: Orthopedics;  Laterality: Left;  Left total knee arthroplasty   TOTAL KNEE ARTHROPLASTY Right 05/31/2017   Procedure: RIGHT TOTAL KNEE ARTHROPLASTY;  Surgeon: Cammy Copa, MD;  Location: [facility] OR;  Service: Orthopedics;  Laterality: Right;   TUBAL LIGATION     Patient Active Problem List   Diagnosis Date Noted   Arthritis of carpometacarpal (CMC) joint of  left thumb 10/18/2023   Arthritis of carpometacarpal [facility]) joint of both thumbs 07/19/2023   OA (osteoarthritis) of shoulder 11/23/2022   HCAP (healthcare-associated pneumonia) 06/03/2017   Sepsis (HCC) 06/03/2017   S/P TKR (total knee replacement), right 06/03/2017   Type 2 diabetes mellitus without complication (HCC) 06/03/2017   Arthritis of knee 05/31/2017   Essential hypertension 08/25/2013   GERD (gastroesophageal reflux disease) 05/23/2013   Narcotic withdrawal (HCC) 05/23/2013   Acute bronchitis 05/23/2013   Osteoarthritis of left knee 04/08/2013    Class: Diagnosis of   Upper airway cough syndrome 10/16/2012    ONSET DATE: 12/21/2023  (surgery)  REFERRING DIAG: M18.12 (ICD-10-CM) - Arthritis of carpometacarpal (CMC) joint of left thumb Splint fabrication/ OT in 2 weeks s/p CMC arthroplasty.     THERAPY DIAG:  Pain in joint of left hand  Stiffness of left hand, not elsewhere classified  Muscle weakness (generalized)  Other lack of coordination  Osteoarthritis of left thumb  Rationale for Evaluation and Treatment: Rehabilitation  SUBJECTIVE:   SUBJECTIVE STATEMENT: Pt reported that she found her Meloxicam and took it today - it is not on her medication list though but she reports having it previously.  She also reported that she had been switching between Methadone and Oxycodone ie) not taking them on the same days but has .  She has not been taking pain medicine regularly. Pt reports massaging and icing it helps it feel better.  Pt accompanied by: self  PERTINENT HISTORY: DM2, HTN, GERD, arthritis CMC L thumb, OA shoulder, s/p R TKR   PRECAUTIONS: Fall  RED FLAGS: None   WEIGHT BEARING RESTRICTIONS:  LUE limitations s/p arthroplasty - splint in place except for exercises  PAIN:  Are you having pain? Yes: NPRS scale: 8/10 down to 5 after scar massage  Pain location: thumb and up the L arm Pain description: throbbing Aggravating factors: moving Relieving factors: pain medicine - melixcam  FALLS: Has patient fallen in last 6 months? No  LIVING ENVIRONMENT: Lives with: lives alone but grandson has been staying with her Lives in: House/apartment Stairs: No - has elevator Has following equipment at home: Single point cane, Walker - 2 wheeled, and Grab bars  PLOF: Independent - was driving before this surgery  PATIENT GOALS: Get to where I was before and use my hand better.  NEXT MD VISIT: return in 1 month - 01/30/24  OBJECTIVE:  Note: Objective measures were completed at Evaluation unless otherwise noted.  HAND DOMINANCE: Right  ADLs: Overall ADLs: Pt reports she has been getting help from her  grandchildren since her surgery Upper body dressing: Assistance to get clothing on with L UE limitations. Lower body dressing: Assistance to adjust clothing and get them over her feet. Toileting: Assistance to adjust clothing. Bathing: Assistance from family for thoroughness.  FUNCTIONAL OUTCOME MEASURES: Quick Dash: 84.1  UPPER EXTREMITY ROM:    ROM - TBA  Active ROM Right eval Left eval  Thumb MCP (0-60) WFL TBA     Thumb IP (0-80)    Thumb Radial abd/add (0-55)    Thumb Palmar abd/add (0-45)    Thumb Opposition to Small Finger     Index MCP (0-90)     Index PIP (0-100)     Index DIP (0-70)     Long MCP (0-90)     Long PIP (0-100)     Long DIP (0-70)     Ring MCP (0-90)     Ring PIP (0-100)  Ring DIP (0-70)     Little MCP (0-90)     Little PIP (0-100)     Little DIP (0-70)     (Blank rows = not tested)   UPPER EXTREMITY MMT:     BUE strength - TBA  HAND FUNCTION: TBA  COORDINATION: TBA  SENSATION: Not tested -pt reports some mild tingling and numbness of L thumb  EDEMA: L thumb  COGNITION: Overall cognitive status: Within functional limits for tasks assessed  OBSERVATIONS: Pt arrived with L arm in a sling and ambulated with no AE and no loss of balance. Pt hummed often throughout session to comfort herself.    TREATMENT DATE:                                                                                                                         01/08/24:   Splinting: Pt had new straps applied/adjusted for improved fit and comfort of the left custom thumb spica splint fabricated at evaluation. Pt was educated to wear the splint at all times for protection, she was educated in splinting use, care and precautions.   Manual: Therapist provided manual techniques and educated pt in scar massage at healed site of incision (above and below a small scab, as well as beside the scar) for reduction of scar tissue, to promote improved AROM and pain reduction of  affected surgical site. Improved appearance to incision noted upon completion with less palpable adhesions and therefore improved ROM.  Pt provided instruction re: massaging scar in three directions: circles, side to side, and up and down with return demonstration sought and handout provided per pt instruction. Pt reports decrease in discomfort  of hand from 8/10 to 5/10 s/p scar massage.  Therapeutic Exercises: Reviewed HEP as listed in pt instructions per [facility] Hand Protocol following L CMC arthroplasty. Pt is only 18 days out from surgery therefore no changes to program at this time.  Pt given verbal instruction, demonstration and return demonstration sought for thumb IP flexion in her splint and wrist flexion/extension out of splint.  She initially had difficulty with wrist flexion but was able to perform task with gravity assistance.  Cues are given  needed max cues to minimize ulnar drift of wrist and digits.    PATIENT EDUCATION: Education details: splint wear, initial HEP and scar massage Person educated: Patient Education method: Explanation, Demonstration, Tactile cues, Verbal cues, and Handouts Education comprehension: verbalized understanding, returned demonstration, verbal cues required, tactile cues required, and needs further education  HOME EXERCISE PROGRAM: 01/02/24 - Initial HEP - Wrist AROM, thumb IP and finger opposition in splint - Access Code: [email or web address]  01/08/24 - Scar massage  GOALS: Goals reviewed with patient? Yes  SHORT TERM GOALS: Target date: 02/15/24  Pt will obtain protective, custom forearm based thumb spica splint/orthotic. Baseline: Custom forearm thumb spica splint fabricated 01/02/24 Goal status: IN Progress  2.  Pt will demo/state understanding of initial HEP to improve pain levels and prerequisite motion for  functional use of her left hand/thumb. Baseline: Initiated HEP at eval Goal status: IN Progress  3. Pt will be independent with scar massage  techniques for max ROM of skin around thumb surgery site.  Baseline: Unable to begin at eval  Goal Status: IN Progress   LONG TERM GOALS: Target date: 03/27/24  Pt will improve functional ability by decreased impairment per Quick DASH assessment from 84.1% to <50% or better, for better quality of life. Baseline: 84.1% Goal status: INITIAL  2.  Pt will improve grip strength in left hand from unable to assess to at least 15 lbs for functional use at home and in IADLs. Baseline: Unable to assess Goal status: INITIAL  3.  Pt will improve A/ROM in left thumb from opposition to tip of index finger on left to at least opposition to PIP (or greater) of left small finger, to have functional motion for tasks like ADL's, reach and grasp.  Baseline: Immobilized Goal status: INITIAL  4.  Pt will improve left hand functional use from unable to assess to at least be Mod I for basic ADL's (ie, brushing her hair, bathing, brushing her teeth and folding laundry) to assist in her ability to carry out selfcare and higher-level homecare tasks with less difficulty. Baseline: Assistance from family with ADLs Goal status: INITIAL  5.  Pt will improve coordination skills in left hand, as seen by better score on 9 hole peg testing to have increased functional ability to carry out fine motor tasks (fasteners, etc.) and more complex, coordinated IADLs (meal prep, sports, etc.).  Baseline: Unable to assist Goal status: INITIAL  6.  Pt will decrease pain at worst from 9/10 to <4/10 or less to have better sleep and occupational participation in daily roles. Baseline: 9/10 Goal status: INITIAL  ASSESSMENT:  CLINICAL IMPRESSION: Patient is a 73 y.o. right HD female who was seen today for occupational therapy treatment following Left thumb CMC arthroplasty on 12/21/23 - currently 18 days post-op. Pt minor adjustments to left custom fabricated forearm based thumb spica splint. Pt educated in HEP as per [place] Hand  Protocol following L CMC arthroplasty - just wrist flexion/extension out of splint and IP flexion in splint.  Pt also initiated some scar massage to healed areas of scar.  She will benefit from out-pt OT to address deficits in A/ROM, decreased functional use of left hand/thumb, pain, pt education, scar management/desensitization, splinting, edema control and overall functional use and strengthening of her left dominant hand.      PERFORMANCE DEFICITS: in functional skills including ADLs, IADLs, coordination, dexterity, sensation, edema, tone, ROM, strength, pain, fascial restrictions, flexibility, Fine motor control, Gross motor control, decreased knowledge of precautions, decreased knowledge of use of DME, wound, skin integrity, and UE functional use, cognitive skills including problem solving, and psychosocial skills including coping strategies, environmental adaptation, and routines and behaviors.   IMPAIRMENTS: are limiting patient from ADLs, IADLs, rest and sleep, leisure, and social participation.   COMORBIDITIES: may have co-morbidities  that affects occupational performance. Patient will benefit from skilled OT to address above impairments and improve overall function.  REHAB POTENTIAL: Good  PLAN:  OT FREQUENCY: 1-2x/week  OT DURATION: 12 weeks  PLANNED INTERVENTIONS: 97535 self care/ADL training, 57846 therapeutic exercise, 97530 therapeutic activity, 97112 neuromuscular re-education, 97140 manual therapy, 97035 ultrasound, 97018 paraffin, 96295 fluidotherapy, 97010 moist heat, 97760 Splinting (initial encounter), [id number] Subsequent splinting/medication, scar mobilization, passive range of motion, coping strategies training, patient/family education, and DME and/or AE instructions  RECOMMENDED  OTHER SERVICES: NA  CONSULTED AND AGREED WITH PLAN OF CARE: Patient  PLAN FOR NEXT SESSION:  Check/modify splints and straps  Custom-fabricated short opponens orthosis to wear during the day at  4 weeks 01/18/24. Check and update HEP as protocol allows Progress scar massage as appropriate    Victorino Sparrow, OT 01/08/2024, 4:26 PM

## 2024-01-10 ENCOUNTER — Ambulatory Visit: Payer: No Typology Code available for payment source | Admitting: Orthopaedic Surgery

## 2024-01-10 ENCOUNTER — Encounter: Payer: Self-pay | Admitting: Orthopaedic Surgery

## 2024-01-10 ENCOUNTER — Ambulatory Visit: Payer: No Typology Code available for payment source | Admitting: Family Medicine

## 2024-01-10 ENCOUNTER — Encounter: Payer: Self-pay | Admitting: Family Medicine

## 2024-01-10 ENCOUNTER — Encounter: Payer: No Typology Code available for payment source | Admitting: Occupational Therapy

## 2024-01-10 DIAGNOSIS — M1812 Unilateral primary osteoarthritis of first carpometacarpal joint, left hand: Secondary | ICD-10-CM

## 2024-01-10 MED ORDER — HYDROCODONE-ACETAMINOPHEN 5-325 MG PO TABS: 1.0000 | ORAL_TABLET | Freq: Every day | ORAL | 0 refills | Status: DC | PRN

## 2024-01-10 NOTE — Progress Notes (Signed)
Post-Op Visit Note   Patient: Gloria Patterson           Date of Birth: Nov 22, 1951           MRN: [id number] Visit Date: 01/10/2024 PCP: Melvenia Beam, MD   Assessment & Plan:  Chief Complaint:  Chief Complaint  Patient presents with   Left Wrist - Pain   Visit Diagnoses:  1. Arthritis of carpometacarpal (CMC) joint of left thumb     Plan: Gloria Patterson is a very pleasant 73 year old female who is about 3 weeks postop from a left [facility] arthroplasty by Dr. Denese Killings.  She comes in today for concern of drainage from the incision.  She is currently doing hand therapy and feels like the pain recently increased because of it.  Denies any constitutional symptoms.  Examination of the left thumb shows a fully healed surgical scar.  There is 2 dried blood blisters that have unroofed.  There is no drainage or cellulitis.  There is moderate diffuse tenderness around the area.  No neurovascular compromise.  There is no drainage that I see on the stockinette.  Fingers warm and well-perfused.  Examination is fairly reassuring to me.  I have refilled Norco.  She can resume hand therapy.  She has an appointment to see Dr. Denese Killings on 01/30/2024.  She should come in sooner should she develop any problems.  Follow-Up Instructions: No follow-ups on file.   Orders:  No orders of the defined types were placed in this encounter.  Meds ordered this encounter  Medications   HYDROcodone-acetaminophen (NORCO) 5-325 MG tablet    Sig: Take 1-2 tablets by mouth daily as needed.    Dispense:  15 tablet    Refill:  0    Imaging: No results found.  PMFS History: Patient Active Problem List   Diagnosis Date Noted   Arthritis of carpometacarpal [facility]) joint of left thumb 10/18/2023   Arthritis of carpometacarpal [facility]) joint of both thumbs 07/19/2023   OA (osteoarthritis) of shoulder 11/23/2022   HCAP (healthcare-associated pneumonia) 06/03/2017   Sepsis (HCC) 06/03/2017   S/P TKR (total knee  replacement), right 06/03/2017   Type 2 diabetes mellitus without complication (HCC) 06/03/2017   Arthritis of knee 05/31/2017   Essential hypertension 08/25/2013   GERD (gastroesophageal reflux disease) 05/23/2013   Narcotic withdrawal (HCC) 05/23/2013   Acute bronchitis 05/23/2013   Osteoarthritis of left knee 04/08/2013    Class: Diagnosis of   Upper airway cough syndrome 10/16/2012   Past Medical History:  Diagnosis Date   Arthritis    knee, shouder   Asthma    MILD   Chronic cough    Constipation    Diabetes mellitus without complication (HCC)    Type II   Frequency of urination    GERD (gastroesophageal reflux disease)    Hepatitis    Hepatitis C treated    History of hepatitis DX 1973  SECONDARY TO DRUG ABUSE--  UNKNOWN TYPE PER PT--  WAS TX'D    NO ISSURES OR S & S SINCE   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  Patient not sure why she was given Phenobarital,   " before 1978"   Urgency of urination    Vaginal vault prolapse    ANTERIOR    Family History  Problem Relation Age of Onset   Prostate cancer Father    Breast cancer Sister     Past Surgical History:  Procedure Laterality Date   ANTERIOR AND POSTERIOR REPAIR N/A 03/10/2013   Procedure: ANTERIOR (CYSTOCELE) AND POSTERIOR REPAIR (RECTOCELE);  Surgeon: Kathi Ludwig, MD;  Location: [facility];  Service: Urology;  Laterality: N/A;   [employer] UPHOLD LITE ANTERIOR VAULT REPAIR  POSSIBLE OP WITH OBSERVATION POSSIBLE FLOOR BED  Uphold LITE w/[employer] number [id number] qty 2 Xenform soft tissue repair matrix 6x7 cm [id number]    BREAST REDUCTION SURGERY     BREAST SURGERY     B/L removal of benign cysts   CARPOMETACARPEL SUSPENSION PLASTY Left 12/21/2023   Procedure: LEFT THUMB CARPOMETACARPAL ARTHROPLASTY WITH INTERNAL  BRACE;  Surgeon: Samuella Cota, MD;  Location: [facility];  Service: Orthopedics;  Laterality: Left;   COLONOSCOPY W/ POLYPECTOMY     CYSTOCELE REPAIR N/A 03/10/2013   Procedure:  Anterior vaginal vault prolapse repair, with [employer] uphold light sacrospinous fixation using U. device and mesh repair with [name] plication   ;  Surgeon: Kathi Ludwig, MD;  Location: [facility];  Service: Urology;  Laterality: N/A;   ESOPHAGOGASTRODUODENOSCOPY     TOTAL KNEE ARTHROPLASTY Left 04/08/2013   Procedure: TOTAL KNEE ARTHROPLASTY;  Surgeon: Cammy Copa, MD;  Location: [facility] OR;  Service: Orthopedics;  Laterality: Left;  Left total knee arthroplasty   TOTAL KNEE ARTHROPLASTY Right 05/31/2017   Procedure: RIGHT TOTAL KNEE ARTHROPLASTY;  Surgeon: Cammy Copa, MD;  Location: [facility] OR;  Service: Orthopedics;  Laterality: Right;   TUBAL LIGATION     Social History   Occupational History   Not on file  Tobacco Use   Smoking status: Never   Smokeless tobacco: Never   Tobacco comments:    CANNOT HAVE NARCOTIC MEDICATIONS PER PT  Vaping Use   Vaping status: Never Used  Substance and Sexual Activity   Alcohol use: No   Drug use: No    Comment: Stopped using  Cocoine and Heroin 2011   Sexual activity: Never

## 2024-01-14 ENCOUNTER — Other Ambulatory Visit: Payer: Medicare PPO

## 2024-01-14 ENCOUNTER — Inpatient Hospital Stay: Admission: RE | Admit: 2024-01-14 | Payer: Medicare PPO | Source: Ambulatory Visit

## 2024-01-15 ENCOUNTER — Ambulatory Visit: Payer: No Typology Code available for payment source | Admitting: Occupational Therapy

## 2024-01-16 ENCOUNTER — Ambulatory Visit: Payer: No Typology Code available for payment source | Admitting: Occupational Therapy

## 2024-01-16 DIAGNOSIS — M25542 Pain in joints of left hand: Secondary | ICD-10-CM | POA: Diagnosis not present

## 2024-01-16 DIAGNOSIS — M6281 Muscle weakness (generalized): Secondary | ICD-10-CM

## 2024-01-16 DIAGNOSIS — M25642 Stiffness of left hand, not elsewhere classified: Secondary | ICD-10-CM

## 2024-01-16 DIAGNOSIS — M1812 Unilateral primary osteoarthritis of first carpometacarpal joint, left hand: Secondary | ICD-10-CM

## 2024-01-16 DIAGNOSIS — R278 Other lack of coordination: Secondary | ICD-10-CM

## 2024-01-16 NOTE — Therapy (Unsigned)
OUTPATIENT OCCUPATIONAL THERAPY ORTHO TREATMENT  Patient Name: Gloria Patterson MRN: [id number] DOB:[birth year], 73 y.o., female Today's Date: 01/16/2024  PCP: Melvenia Beam, MD REFERRING PROVIDER: Samuella Cota, MD  END OF SESSION:  OT End of Session - 01/16/24 1216     Visit Number 3    Number of Visits 20    Date for OT Re-Evaluation 03/29/23    Authorization Type [employer] 2025/Medicaid No Copay/[place] VL: [place] Auth Not Reqd    OT Start Time 1220    OT Stop Time 1305    OT Time Calculation (min) 45 min    Equipment Utilized During Treatment splint, ultrasound    Activity Tolerance Patient tolerated treatment well;Patient limited by pain    Behavior During Therapy WFL for tasks assessed/performed             Past Medical History:  Diagnosis Date   Arthritis    knee, shouder   Asthma    MILD   Chronic cough    Constipation    Diabetes mellitus without complication (HCC)    Type II   Frequency of urination    GERD (gastroesophageal reflux disease)    Hepatitis    Hepatitis C treated    History of hepatitis DX 1973  SECONDARY TO DRUG ABUSE--  UNKNOWN TYPE PER PT--  WAS TX'D    NO ISSURES OR S & S SINCE   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  Patient not sure why she was given Phenobarital,   " before 1978"   Urgency of urination    Vaginal vault prolapse    ANTERIOR   Past Surgical History:  Procedure Laterality Date   ANTERIOR AND POSTERIOR REPAIR N/A 03/10/2013   Procedure: ANTERIOR (CYSTOCELE) AND POSTERIOR REPAIR (RECTOCELE);  Surgeon: Kathi Ludwig, MD;  Location: [facility];  Service: Urology;  Laterality: N/A;   [employer] UPHOLD LITE ANTERIOR VAULT REPAIR  POSSIBLE OP WITH OBSERVATION POSSIBLE FLOOR BED  Uphold LITE w/[employer] number [id number]  qty 2 Xenform soft tissue repair matrix 6x7 cm [id number]    BREAST REDUCTION SURGERY     BREAST SURGERY     B/L removal of benign cysts   CARPOMETACARPEL SUSPENSION PLASTY Left 12/21/2023   Procedure: LEFT THUMB CARPOMETACARPAL ARTHROPLASTY WITH INTERNAL BRACE;  Surgeon: Samuella Cota, MD;  Location: [facility];  Service: Orthopedics;  Laterality: Left;   COLONOSCOPY W/ POLYPECTOMY     CYSTOCELE REPAIR N/A 03/10/2013   Procedure:  Anterior vaginal vault prolapse repair, with [employer] uphold light sacrospinous fixation using U. device and mesh repair with [name] plication   ;  Surgeon: Kathi Ludwig, MD;  Location: [facility] [place];  Service: Urology;  Laterality: N/A;   ESOPHAGOGASTRODUODENOSCOPY     TOTAL KNEE ARTHROPLASTY Left 04/08/2013   Procedure: TOTAL KNEE ARTHROPLASTY;  Surgeon: Cammy Copa, MD;  Location: [facility] OR;  Service: Orthopedics;  Laterality: Left;  Left total knee arthroplasty   TOTAL KNEE ARTHROPLASTY Right 05/31/2017   Procedure: RIGHT TOTAL KNEE ARTHROPLASTY;  Surgeon: Cammy Copa, MD;  Location: [facility] OR;  Service: Orthopedics;  Laterality: Right;   TUBAL LIGATION     Patient Active Problem List   Diagnosis Date Noted   Arthritis of carpometacarpal [facility]) joint  of left thumb 10/18/2023   Arthritis of carpometacarpal [facility]) joint of both thumbs 07/19/2023   OA (osteoarthritis) of shoulder 11/23/2022   HCAP (healthcare-associated pneumonia) 06/03/2017   Sepsis (HCC) 06/03/2017   S/P TKR (total knee replacement), right 06/03/2017   Type 2 diabetes mellitus without complication (HCC) 06/03/2017   Arthritis of knee 05/31/2017   Essential hypertension 08/25/2013   GERD (gastroesophageal reflux disease) 05/23/2013   Narcotic withdrawal (HCC) 05/23/2013   Acute bronchitis 05/23/2013   Osteoarthritis of left knee 04/08/2013    Class: Diagnosis of   Upper airway cough syndrome 10/16/2012    ONSET DATE: 12/21/2023  (surgery)  REFERRING DIAG: M18.12 (ICD-10-CM) - Arthritis of carpometacarpal (CMC) joint of left thumb Splint fabrication/ OT in 2 weeks s/p CMC arthroplasty.     THERAPY DIAG:  Pain in joint of left hand  Stiffness of left hand, not elsewhere classified  Muscle weakness (generalized)  Other lack of coordination  Osteoarthritis of left thumb  Rationale for Evaluation and Treatment: Rehabilitation  SUBJECTIVE:   SUBJECTIVE STATEMENT: Pt reports needing to take her splint off to let her arm rest at times as she was sitting in the waiting room, holding her wrist with the splint removed.  Dr. Fara Boros appt: 01/30/24  Pt accompanied by: self  PERTINENT HISTORY: DM2, HTN, GERD, arthritis CMC L thumb, OA shoulder, s/p R TKR   PRECAUTIONS: Fall  RED FLAGS: None   WEIGHT BEARING RESTRICTIONS:  LUE limitations s/p arthroplasty - splint in place except for exercises  PAIN:  Are you having pain? Not too bad at rest Yes: NPRS scale: aching but no number provided, after scar massage  Pain location: thumb  Pain description: throbbing Aggravating factors: moving Relieving factors: rest and pain medicine   FALLS: Has patient fallen in last 6 months? No  LIVING ENVIRONMENT: Lives with: lives alone but grandson has been staying with her Lives in: House/apartment Stairs: No - has elevator Has following equipment at home: Single point cane, Walker - 2 wheeled, and Grab bars  PLOF: Independent - was driving before this surgery  PATIENT GOALS: Get to where I was before and use my hand better.  NEXT MD VISIT: return in 1 month - 01/30/24  OBJECTIVE:  Note: Objective measures were completed at Evaluation unless otherwise noted.  HAND DOMINANCE: Right  ADLs: Overall ADLs: Pt reports she has been getting help from her grandchildren since her surgery Upper body dressing: Assistance to get clothing on with L UE limitations. Lower body dressing: Assistance to adjust clothing and get them  over her feet. Toileting: Assistance to adjust clothing. Bathing: Assistance from family for thoroughness.  FUNCTIONAL OUTCOME MEASURES: Quick Dash: 84.1  UPPER EXTREMITY ROM:    ROM - TBA  Active ROM Right eval Left eval  Thumb MCP (0-60) WFL TBA     Thumb IP (0-80)    Thumb Radial abd/add (0-55)    Thumb Palmar abd/add (0-45)    Thumb Opposition to Small Finger     Index MCP (0-90)     Index PIP (0-100)     Index DIP (0-70)     Long MCP (0-90)     Long PIP (0-100)     Long DIP (0-70)     Ring MCP (0-90)     Ring PIP (0-100)     Ring DIP (0-70)     Little MCP (0-90)     Little PIP (0-100)     Little DIP (0-70)     (Blank  rows = not tested)   UPPER EXTREMITY MMT:     BUE strength - TBA  HAND FUNCTION: TBA  COORDINATION: TBA  SENSATION: Not tested -pt reports some mild tingling and numbness of L thumb  EDEMA: L thumb  COGNITION: Overall cognitive status: Within functional limits for tasks assessed  OBSERVATIONS: Pt arrived with L arm in a sling and ambulated with no AE and no loss of balance. Pt hummed often throughout session to comfort herself.    TODAY'S TREATMENT: DATE:                                                                                                 01/17/24:   - Ultrasound completed for duration as noted below including:  Ultrasound applied to base of L thumb - particularly scar for 8 minutes, frequency of 3 MHz, 20% duty cycle, and 1.1 W/cm with pt's arm placed on soft towel for promotion of scar mobilization, edema reduction, and pain reduction in affected extremity.  Manual: Therapist provided manual techniques and further education pt in scar massage at healed site of incision for cleaning area, reducing stiffness and dimpling of scar tissue, to promote improved AROM and pain reduction of affected surgical site. Improved appearance to incision noted upon completion with more dry skin flaking off with less palpable adhesions and  therefore improved ROM.  Pt reviewed instruction re: massaging scar in three directions: circles, side to side, and up and down with return demonstration sought.  Max cues provided for positioning wrist in neutral ie) minimizing ulnar deviation of wrist during scar massage and exercises.  Therapeutic Exercises: Reviewed HEP with progression from AROM of wrist flex/extension to PROM of these motions as listed in pt instructions per [facility] Hand Protocol following L CMC arthroplasty. Pt is only 26 days out from surgery (3 weeks) therefore added Thumb AROM MP Blocking also listed in pt instructions.  Pt given visual, tactile and verbal instruction, demonstration and return demonstration sought for thumb MP flexion out of her splint while holding the base of her thumb.  She initially had difficulty with relaxing her thumb to avoid extension but was able to perform task with tactile and verbal cue to relax thumb.  Cues are given throughout to minimize ulnar drift of wrist and digits.    PATIENT EDUCATION: Education details: updated HEP and scar massage Person educated: Patient Education method: Explanation, Demonstration, Tactile cues, Verbal cues, and Handouts Education comprehension: verbalized understanding, returned demonstration, verbal cues required, tactile cues required, and needs further education  HOME EXERCISE PROGRAM: 01/02/24 - Initial HEP - Wrist AROM, thumb IP and finger opposition in splint - Access Code: [email or web address]  01/08/24 - Scar massage 01/17/24 - Progress HEP - Wrist PROM, thumb AROM with MP blocking - same access coed  GOALS: Goals reviewed with patient? Yes  SHORT TERM GOALS: Target date: 02/15/24  Pt will  be independent with splint wear and care for custom forearm spica splint at night and custom hand based spica splint in the day. Baseline: Custom forearm thumb spica splint fabricated 01/02/24 Goal status: Revised 01/16/24  2.  Pt will demo/state understanding of initial HEP to  improve pain levels and prerequisite motion for functional use of her left hand/thumb. Baseline: Initiated HEP at eval Goal status: IN Progress  3. Pt will be independent with scar massage techniques for max ROM of skin around thumb surgery site.  Baseline: Unable to begin at eval  Goal Status: IN Progress   LONG TERM GOALS: Target date: 03/27/24  Pt will improve functional ability by decreased impairment per Quick DASH assessment from 84.1% to <50% or better, for better quality of life. Baseline: 84.1% Goal status: INITIAL  2.  Pt will improve grip strength in left hand from unable to assess to at least 15 lbs for functional use at home and in IADLs. Baseline: Unable to assess Goal status: INITIAL  3.  Pt will improve A/ROM in left thumb from opposition to tip of index finger on left to at least opposition to PIP (or greater) of left small finger, to have functional motion for tasks like ADL's, reach and grasp.  Baseline: Immobilized Goal status: INITIAL  4.  Pt will improve left hand functional use from unable to assess to at least be Mod I for basic ADL's (ie, brushing her hair, bathing, brushing her teeth and folding laundry) to assist in her ability to carry out selfcare and higher-level homecare tasks with less difficulty. Baseline: Assistance from family with ADLs Goal status: INITIAL  5.  Pt will improve coordination skills in left hand, as seen by better score on 9 hole peg testing to have increased functional ability to carry out fine motor tasks (fasteners, etc.) and more complex, coordinated IADLs (meal prep, sports, etc.).  Baseline: Unable to assist Goal status: INITIAL  6.  Pt will decrease pain at worst from 9/10 to <4/10 or less to have better sleep and occupational participation in daily roles. Baseline: 9/10 Goal status: INITIAL  ASSESSMENT:  CLINICAL IMPRESSION: Patient is a 73 y.o. right HD female who was seen today for occupational therapy treatment  following Left thumb CMC arthroplasty on 12/21/23 - currently 26 days post-op. Pt educated in progression of HEP as per [place] Hand Protocol following L CMC arthroplasty - PROM wrist flexion/extension out of splint and thumb ROM with MP blocking today.  Pt also tolerated scar massage to healed scar.  She will benefit from continued out-pt OT to address deficits in A/ROM, decreased functional use of left hand/thumb, pain, pt education, scar management/desensitization, splinting, edema control and overall functional use and strengthening of her left dominant hand.      PERFORMANCE DEFICITS: in functional skills including ADLs, IADLs, coordination, dexterity, sensation, edema, tone, ROM, strength, pain, fascial restrictions, flexibility, Fine motor control, Gross motor control, decreased knowledge of precautions, decreased knowledge of use of DME, wound, skin integrity, and UE functional use, cognitive skills including problem solving, and psychosocial skills including coping strategies, environmental adaptation, and routines and behaviors.   IMPAIRMENTS: are limiting patient from ADLs, IADLs, rest and sleep, leisure, and social participation.   COMORBIDITIES: may have co-morbidities  that affects occupational performance. Patient will benefit from skilled OT to address above impairments and improve overall function.  REHAB POTENTIAL: Good  PLAN:  OT FREQUENCY: 1-2x/week  OT DURATION: 12 weeks  PLANNED INTERVENTIONS: 97535 self care/ADL training, 16109 therapeutic exercise, 97530 therapeutic activity, 97112 neuromuscular re-education, 97140 manual therapy, 97035 ultrasound, 97018 paraffin, 60454 fluidotherapy, 97010 moist heat, 97760 Splinting (initial encounter), [id number] Subsequent splinting/medication, scar mobilization, passive range of motion, coping strategies training, patient/family education, and DME  and/or AE instructions  RECOMMENDED OTHER SERVICES: NA  CONSULTED AND AGREED WITH PLAN OF CARE:  Patient  PLAN FOR NEXT SESSION:  Custom-fabricated short opponens orthosis to wear during the day at 4 weeks 01/18/24 Check/modify splints and straps  Check and update HEP as protocol recommends - pt will be 4 weeks post-op   Progress scar massage as appropriate; modalities [place] etc    Victorino Sparrow, OT 01/16/2024, 5:59 PM

## 2024-01-17 ENCOUNTER — Encounter: Payer: No Typology Code available for payment source | Admitting: Occupational Therapy

## 2024-01-17 NOTE — Patient Instructions (Signed)
Access Code: [email or web address] URL: [email or web address] Date: 01/17/2024 Prepared by: Amada Kingfisher  Reviewed Exercises - Seated Thumb IP Flexion AROM with Blocking  - 3-4 x daily - 7 x weekly - 1 sets - 10 reps - Finger O  - 3-4 x daily - 7 x weekly - 1 sets - 10 reps - Wrist AROM Flexion Extension  - 3-4 x daily - 7 x weekly - 1 sets - 10 reps - Anterior Wrist Scar Massage  - 3-4 x daily - 7 x weekly - 1 sets - 10 reps  New Exercises - Thumb AROM MP Blocking  - 3-4 x daily - 7 x weekly - 1 sets - 10 reps - Seated Wrist Flexion PROM Stretch  - 3-4 x daily - 7 x weekly - 1 sets - 10 reps - Seated Wrist Extension PROM  - 3-4 x daily - 7 x weekly - 1 sets - 10 reps

## 2024-01-22 ENCOUNTER — Ambulatory Visit: Payer: No Typology Code available for payment source | Attending: Orthopedic Surgery | Admitting: Occupational Therapy

## 2024-01-22 DIAGNOSIS — M25542 Pain in joints of left hand: Secondary | ICD-10-CM | POA: Insufficient documentation

## 2024-01-22 DIAGNOSIS — R278 Other lack of coordination: Secondary | ICD-10-CM | POA: Insufficient documentation

## 2024-01-22 DIAGNOSIS — M25642 Stiffness of left hand, not elsewhere classified: Secondary | ICD-10-CM | POA: Insufficient documentation

## 2024-01-22 DIAGNOSIS — M6281 Muscle weakness (generalized): Secondary | ICD-10-CM | POA: Insufficient documentation

## 2024-01-22 DIAGNOSIS — M1812 Unilateral primary osteoarthritis of first carpometacarpal joint, left hand: Secondary | ICD-10-CM | POA: Insufficient documentation

## 2024-01-24 ENCOUNTER — Encounter: Payer: Self-pay | Admitting: Occupational Therapy

## 2024-01-24 ENCOUNTER — Ambulatory Visit: Payer: No Typology Code available for payment source | Admitting: Occupational Therapy

## 2024-01-24 NOTE — Therapy (Signed)
 [facility] [address] [place], [place], [address] Phone: [phone]   Fax:  [phone]  Patient Details  Name: Gloria Patterson MRN: [id number] Date of Birth: 03-23-1951  Encounter Date: 01/24/2024  This encounter is to document OTR's call to the patient via mobile phone s/p missed visits 01/22/24 and today 01/24/24, without telephone contact to cancel appts.  OTR was able to speak with patient who reported she was not feeling well and stated she was sorry she didn't come.  OTR confirmed next appointment on Tuesday, February 11th at 12:30 PM.  Pt was to have a new hand based splint fabricated this week and progress HEP but no formal changes in exercises for AROM were able to be made.  Pt was encouraged to work on finger opposition outside of the splint now but no other exercises were added.  OTR emphasized importance of attending appt next week for new splint prior to MD appt the next day (Feb 12th).   Clarita LITTIE Pride, OT 01/24/2024, 1:05 PM  [facility] [address] [place], [place], [address] Phone: [phone]   Fax:  [phone]

## 2024-01-29 ENCOUNTER — Ambulatory Visit: Payer: No Typology Code available for payment source | Admitting: Occupational Therapy

## 2024-01-29 DIAGNOSIS — M1812 Unilateral primary osteoarthritis of first carpometacarpal joint, left hand: Secondary | ICD-10-CM | POA: Diagnosis present

## 2024-01-29 DIAGNOSIS — M6281 Muscle weakness (generalized): Secondary | ICD-10-CM | POA: Diagnosis present

## 2024-01-29 DIAGNOSIS — M25642 Stiffness of left hand, not elsewhere classified: Secondary | ICD-10-CM | POA: Diagnosis present

## 2024-01-29 DIAGNOSIS — M25542 Pain in joints of left hand: Secondary | ICD-10-CM

## 2024-01-29 DIAGNOSIS — R278 Other lack of coordination: Secondary | ICD-10-CM | POA: Diagnosis present

## 2024-01-29 NOTE — Therapy (Unsigned)
OUTPATIENT OCCUPATIONAL THERAPY ORTHO TREATMENT  Patient Name: DUCHESS ARMENDAREZ MRN: [id number] DOB:Nov 29, 1951, 73 y.o., female Today's Date: 01/29/2024  PCP: Melvenia Beam, MD REFERRING PROVIDER: Samuella Cota, MD  END OF SESSION:  OT End of Session - 01/29/24 1236     Visit Number 4    Number of Visits 20    Date for OT Re-Evaluation 03/29/23    Authorization Type [employer] 2025/Medicaid No Copay/[place] VL: [place] Auth Not Reqd    OT Start Time 1235    OT Stop Time 1330    OT Time Calculation (min) 55 min    Equipment Utilized During Treatment splint, Orficast    Activity Tolerance Patient tolerated treatment well    Behavior During Therapy WFL for tasks assessed/performed             Past Medical History:  Diagnosis Date   Arthritis    knee, shouder   Asthma    MILD   Chronic cough    Constipation    Diabetes mellitus without complication (HCC)    Type II   Frequency of urination    GERD (gastroesophageal reflux disease)    Hepatitis    Hepatitis C treated    History of hepatitis DX 1973  SECONDARY TO DRUG ABUSE--  UNKNOWN TYPE PER PT--  WAS TX'D    NO ISSURES OR S & S SINCE   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  Patient not sure why she was given Phenobarital,   " before 1978"   Urgency of urination    Vaginal vault prolapse    ANTERIOR   Past Surgical History:  Procedure Laterality Date   ANTERIOR AND POSTERIOR REPAIR N/A 03/10/2013   Procedure: ANTERIOR (CYSTOCELE) AND POSTERIOR REPAIR (RECTOCELE);  Surgeon: Kathi Ludwig, MD;  Location: [facility];  Service: Urology;  Laterality: N/A;   [employer] UPHOLD LITE ANTERIOR VAULT REPAIR  POSSIBLE OP WITH OBSERVATION POSSIBLE FLOOR BED  Uphold LITE w/[employer] number [id number] qty 2 Xenform soft tissue  repair matrix 6x7 cm [id number]    BREAST REDUCTION SURGERY     BREAST SURGERY     B/L removal of benign cysts   CARPOMETACARPEL SUSPENSION PLASTY Left 12/21/2023   Procedure: LEFT THUMB CARPOMETACARPAL ARTHROPLASTY WITH INTERNAL BRACE;  Surgeon: Samuella Cota, MD;  Location: [facility];  Service: Orthopedics;  Laterality: Left;   COLONOSCOPY W/ POLYPECTOMY     CYSTOCELE REPAIR N/A 03/10/2013   Procedure:  Anterior vaginal vault prolapse repair, with [employer] uphold light sacrospinous fixation using U. device and mesh repair with [name] plication   ;  Surgeon: Kathi Ludwig, MD;  Location: [facility];  Service: Urology;  Laterality: N/A;   ESOPHAGOGASTRODUODENOSCOPY     TOTAL KNEE ARTHROPLASTY Left 04/08/2013   Procedure: TOTAL KNEE ARTHROPLASTY;  Surgeon: Cammy Copa, MD;  Location: [facility];  Service: Orthopedics;  Laterality: Left;  Left total knee arthroplasty   TOTAL KNEE ARTHROPLASTY Right 05/31/2017   Procedure: RIGHT TOTAL KNEE ARTHROPLASTY;  Surgeon: Cammy Copa, MD;  Location: [facility] OR;  Service: Orthopedics;  Laterality: Right;   TUBAL LIGATION     Patient Active Problem List   Diagnosis Date Noted   Arthritis of carpometacarpal [facility]) joint of left thumb  10/18/2023   Arthritis of carpometacarpal (CMC) joint of both thumbs 07/19/2023   OA (osteoarthritis) of shoulder 11/23/2022   HCAP (healthcare-associated pneumonia) 06/03/2017   Sepsis (HCC) 06/03/2017   S/P TKR (total knee replacement), right 06/03/2017   Type 2 diabetes mellitus without complication (HCC) 06/03/2017   Arthritis of knee 05/31/2017   Essential hypertension 08/25/2013   GERD (gastroesophageal reflux disease) 05/23/2013   Narcotic withdrawal (HCC) 05/23/2013   Acute bronchitis 05/23/2013   Osteoarthritis of left knee 04/08/2013    Class: Diagnosis of   Upper airway cough syndrome 10/16/2012    ONSET DATE: 12/21/2023 (surgery)  REFERRING DIAG: M18.12  (ICD-10-CM) - Arthritis of carpometacarpal (CMC) joint of left thumb Splint fabrication/ OT in 2 weeks s/p CMC arthroplasty.     THERAPY DIAG:  Pain in joint of left hand  Stiffness of left hand, not elsewhere classified  Muscle weakness (generalized)  Other lack of coordination  Osteoarthritis of left thumb  Rationale for Evaluation and Treatment: Rehabilitation  SUBJECTIVE:   SUBJECTIVE STATEMENT: Pt returns today s/p missing visits last week as she wasn't feeling well.  She continued to report the hand as hurting but at end of session, she did note that it does ease off as she starts to move it.  As OTR has been emphasizing importance of keeping her wrist/fingers neutral due to tendency for ulnar deviation, pt reports the fingers still "go to that side" and she uses the table top to help with her exercises.   Dr. Fara Boros appt: 01/30/24  Pt accompanied by: self  PERTINENT HISTORY: DM2, HTN, GERD, arthritis CMC L thumb, OA shoulder, s/p R TKR   PRECAUTIONS: Fall  RED FLAGS: None   WEIGHT BEARING RESTRICTIONS:  LUE limitations s/p arthroplasty - splint in place except for exercises  PAIN:  Are you having pain?  Yes: NPRS scale: 8/10 at beginning of session but 5/10 after moving her hand  Pain location: thumb  Pain description: throbbing Aggravating factors: moving Relieving factors: rest and pain medicine   FALLS: Has patient fallen in last 6 months? No  LIVING ENVIRONMENT: Lives with: lives alone but grandson has been staying with her Lives in: House/apartment Stairs: No - has elevator Has following equipment at home: Single point cane, Walker - 2 wheeled, and Grab bars  PLOF: Independent - was driving before this surgery  PATIENT GOALS: Get to where I was before and use my hand better.  NEXT MD VISIT: return in 1 month - 01/30/24  OBJECTIVE:  Note: Objective measures were completed at Evaluation unless otherwise noted.  HAND DOMINANCE: Right  ADLs: Overall  ADLs: Pt reports she has been getting help from her grandchildren since her surgery Upper body dressing: Assistance to get clothing on with L UE limitations. Lower body dressing: Assistance to adjust clothing and get them over her feet. Toileting: Assistance to adjust clothing. Bathing: Assistance from family for thoroughness.  FUNCTIONAL OUTCOME MEASURES: Quick Dash: 84.1  UPPER EXTREMITY ROM:    ROM - TBA  Active ROM Right eval Left eval  Thumb MCP (0-60) WFL TBA     Thumb IP (0-80)    Thumb Radial abd/add (0-55)    Thumb Palmar abd/add (0-45)    Thumb Opposition to Small Finger     Index MCP (0-90)     Index PIP (0-100)     Index DIP (0-70)     Long MCP (0-90)     Long PIP (0-100)     Long DIP (0-70)  Ring MCP (0-90)     Ring PIP (0-100)     Ring DIP (0-70)     Little MCP (0-90)     Little PIP (0-100)     Little DIP (0-70)     (Blank rows = not tested)   UPPER EXTREMITY MMT:     BUE strength - TBA  HAND FUNCTION: TBA  COORDINATION: TBA  SENSATION: Not tested -pt reports some mild tingling and numbness of L thumb  EDEMA: L thumb  COGNITION: Overall cognitive status: Within functional limits for tasks assessed  OBSERVATIONS: Pt arrived with L arm in a sling and ambulated with no AE and no loss of balance. Pt hummed often throughout session to comfort herself.    TODAY'S TREATMENT: DATE:                                                                                                 01/29/24:   Splint: Therapist provided feedback re: scar massage at healed site of incision to reducing stiffness and dimpling of scar tissue, to promote improved AROM and reduce hypersensitivity through slow deep pressure rather than short, light touch. Pt conducted her own scar massage while OT fabricated new splint in order to prepare for TE listed below.    Therapist fabricated hand based thumb splint or Orficast materail for daytime use with positioning of L UE  supporting CMC.  Wearing instructions discussed but will be reviewed during next session and signs of skin breakdown will be monitored during future sessions to assure appropriate fit. Pt instructed to wear custom-fabricated short opponens orthosis during the day between exercises 4-6 times/day and to wear the static long wrist and thumb splint at night.  Therapeutic Exercises: Reviewed HEP with progression to more AROM of thumb as listed in pt instructions per [facility] Hand Protocol following L CMC arthroplasty. Pt is only 5+ weeks post surgery therefore added following New Exercises:  - Seated Thumb Composite Extension and Flexion   - Thumb AROM: Palmar Abduction   - Thumb AROM Circumduction Clockwise and Counterclockwise   - Thumb Opposition   Pt given visual, tactile and verbal instruction, demonstration and return demonstration sought for thumb AROM out of her splint within comfortable range and slowly increasing ROM.  She still has some ulnar deviation at the wrist with unsupported exercises and is engaged in exercises with forearm neutral on tabletop to minimize excessive stretch on thumb CMC.    PATIENT EDUCATION: Education details: updated HEP, scar massage and new splint schedule Person educated: Patient Education method: Explanation, Demonstration, Tactile cues, Verbal cues, and Handouts Education comprehension: verbalized understanding, returned demonstration, verbal cues required, tactile cues required, and needs further education  HOME EXERCISE PROGRAM: 01/02/24 - Initial HEP - Wrist AROM, thumb IP and finger opposition in splint - Access Code: [email or web address]  01/08/24 - Scar massage 01/17/24 - Progress HEP - Wrist PROM, thumb AROM with MP blocking - same access coed  GOALS: Goals reviewed with patient? Yes  SHORT TERM GOALS: Target date: 02/15/24  Pt will  be independent with splint wear and care for custom forearm spica  splint at night and custom hand based spica splint in the  day. Baseline: Custom forearm thumb spica splint fabricated 01/02/24 Goal status: Revised 01/16/24  2.  Pt will demo/state understanding of initial HEP to improve pain levels and prerequisite motion for functional use of her left hand/thumb. Baseline: Initiated HEP at eval Goal status: IN Progress  3. Pt will be independent with scar massage techniques for max ROM of skin around thumb surgery site.  Baseline: Unable to begin at eval  Goal Status: IN Progress   LONG TERM GOALS: Target date: 03/27/24  Pt will improve functional ability by decreased impairment per Quick DASH assessment from 84.1% to <50% or better, for better quality of life. Baseline: 84.1% Goal status: INITIAL  2.  Pt will improve grip strength in left hand from unable to assess to at least 15 lbs for functional use at home and in IADLs. Baseline: Unable to assess Goal status: INITIAL  3.  Pt will improve A/ROM in left thumb from opposition to tip of index finger on left to at least opposition to PIP (or greater) of left small finger, to have functional motion for tasks like ADL's, reach and grasp.  Baseline: Immobilized Goal status: IN Progress  4.  Pt will improve left hand functional use from unable to assess to at least be Mod I for basic ADL's (ie, brushing her hair, bathing, brushing her teeth and folding laundry) to assist in her ability to carry out selfcare and higher-level homecare tasks with less difficulty. Baseline: Assistance from family with ADLs Goal status: INITIAL  5.  Pt will improve coordination skills in left hand, as seen by better score on 9 hole peg testing to have increased functional ability to carry out fine motor tasks (fasteners, etc.) and more complex, coordinated IADLs (meal prep, sports, etc.).  Baseline: Unable to assist Goal status: INITIAL  6.  Pt will decrease pain at worst from 9/10 to <4/10 or less to have better sleep and occupational participation in daily roles. Baseline:  9/10 Goal status: INITIAL  ASSESSMENT:  CLINICAL IMPRESSION: Patient is a 73 y.o. right HD female who was seen today for occupational therapy treatment following Left thumb CMC arthroplasty on 12/21/23 - currently 5+ weeks post-op. A custom-fabricated short opponens orthosis was made today for pt to wear during the day. Pt educated in progression of HEP as per [place] Hand Protocol following L CMC arthroplasty - thumb AROM without MP blocking now.  Pt also tolerated scar massage to healed scar.  She will benefit from continued out-pt OT to address deficits in A/ROM, decreased functional use of left hand/thumb, pain, pt education, scar management/desensitization, splinting, edema control and overall functional use and strengthening of her left dominant hand.      PERFORMANCE DEFICITS: in functional skills including ADLs, IADLs, coordination, dexterity, sensation, edema, tone, ROM, strength, pain, fascial restrictions, flexibility, Fine motor control, Gross motor control, decreased knowledge of precautions, decreased knowledge of use of DME, wound, skin integrity, and UE functional use, cognitive skills including problem solving, and psychosocial skills including coping strategies, environmental adaptation, and routines and behaviors.   IMPAIRMENTS: are limiting patient from ADLs, IADLs, rest and sleep, leisure, and social participation.   COMORBIDITIES: may have co-morbidities  that affects occupational performance. Patient will benefit from skilled OT to address above impairments and improve overall function.  REHAB POTENTIAL: Good  PLAN:  OT FREQUENCY: 1-2x/week  OT DURATION: 12 weeks  PLANNED INTERVENTIONS: 97535 self care/ADL training, 40981 therapeutic exercise, 97530 therapeutic  activity, 97112 neuromuscular re-education, 97140 manual therapy, 97035 ultrasound, 29562 paraffin, 97039 fluidotherapy, 97010 moist heat, 97760 Splinting (initial encounter), [id number] Subsequent splinting/medication,  scar mobilization, passive range of motion, coping strategies training, patient/family education, and DME and/or AE instructions  RECOMMENDED OTHER SERVICES: NA  CONSULTED AND AGREED WITH PLAN OF CARE: Patient  PLAN FOR NEXT SESSION:   Check/modify splints and straps  Check and update HEP as protocol recommends - pt will be 6+weeks post-op due to missed appts so will need to progress through 5 Weeks Postop  PROM exercises to the MPJ and IPJ of the thumb, with the [facility] joint supported (manually or  with an orthosis).  Progress scar massage as appropriate; modalities [place] etc    Victorino Sparrow, OT 01/29/2024, 5:12 PM

## 2024-01-29 NOTE — Patient Instructions (Signed)
Access Code: [email or web address] URL: [email or web address] Date: 01/29/2024 Prepared by: Amada Kingfisher  New Exercises  - Seated Thumb Composite Extension and Flexion  - 4-6 x daily - 10 reps - Thumb AROM: Palmar Abduction  - 4-6 x daily - 10 reps - Thumb AROM Circumduction Clockwise and Counterclockwise  - 4-6 x daily - 10 reps - Thumb Opposition  - 4-6 x daily - 10 reps

## 2024-01-30 ENCOUNTER — Ambulatory Visit (INDEPENDENT_AMBULATORY_CARE_PROVIDER_SITE_OTHER): Payer: No Typology Code available for payment source | Admitting: Orthopedic Surgery

## 2024-01-30 ENCOUNTER — Other Ambulatory Visit: Payer: Self-pay | Admitting: Orthopedic Surgery

## 2024-01-30 DIAGNOSIS — M1812 Unilateral primary osteoarthritis of first carpometacarpal joint, left hand: Secondary | ICD-10-CM

## 2024-01-30 MED ORDER — NAPROXEN 500 MG PO TABS: 500.0000 mg | ORAL_TABLET | Freq: Two times a day (BID) | ORAL | 1 refills | Status: DC | PRN

## 2024-01-30 NOTE — Progress Notes (Signed)
   Gloria Patterson - 73 y.o. female MRN [id number]  Date of birth: 1951-04-09  Office Visit Note: Visit Date: 01/30/2024 PCP: Melvenia Beam, MD Referred by: Melvenia Beam,*  Subjective:  HPI: Gloria Patterson is a 73 y.o. female who presents today for follow up 6 weeks status post left thumb cmc arthroplasty with internal brace.  She is having some ongoing pain at the basilar aspect of the thumb.  She is doing her occupational therapy as instructed, is progressing from a range of motion standpoint.  Pertinent ROS were reviewed with the patient and found to be negative unless otherwise specified above in HPI.   Assessment & Plan: Visit Diagnoses: No diagnosis found.  Plan: Continue with postoperative protocol as instructed, continue range of motion exercises for the next 2 weeks.  Will progress to strengthening at the 8-week mark per protocol.  Her thumb spica brace has been transition to hand-based at this point which is appropriate.  Will send in some more anti-inflammatory medication for utilize around her therapy sessions per her request.   Follow-up: No follow-ups on file.   Meds & Orders: No orders of the defined types were placed in this encounter.  No orders of the defined types were placed in this encounter.    Procedures: No procedures performed       Objective:   Vital Signs: There were no vitals taken for this visit.  Ortho Exam Left wrist: - Well-healed incision at the glabrous/nonglabrous juncture over the [facility] region of the thumb - Thumb opposition to the ring finger DIP - Thumb circumduction without significant pain or crepitus - Hand is warm well-perfused, sensation intact in all distributions including DRSN - Thumb pinch strength not tested today   Imaging: No results found.   Annabeth Tortora Trevor Mace, M.D. [facility] 10:02 AM

## 2024-01-31 ENCOUNTER — Ambulatory Visit: Payer: No Typology Code available for payment source | Admitting: Occupational Therapy

## 2024-01-31 ENCOUNTER — Telehealth: Payer: Self-pay | Admitting: Occupational Therapy

## 2024-01-31 NOTE — Telephone Encounter (Signed)
OTR reached out to patient s/p no-show appointment this morning and pt reported she wasn't feeling well again.  She is informed of appts next week and she confirmed that after MD appt yesterday, she will be sure to come for her therapy.  She is also made aware of need to contact this office regarding missed visits due to cancellation policy.

## 2024-02-05 ENCOUNTER — Ambulatory Visit: Payer: No Typology Code available for payment source | Admitting: Occupational Therapy

## 2024-02-05 DIAGNOSIS — M25642 Stiffness of left hand, not elsewhere classified: Secondary | ICD-10-CM

## 2024-02-05 DIAGNOSIS — R278 Other lack of coordination: Secondary | ICD-10-CM

## 2024-02-05 DIAGNOSIS — M1812 Unilateral primary osteoarthritis of first carpometacarpal joint, left hand: Secondary | ICD-10-CM

## 2024-02-05 DIAGNOSIS — M25542 Pain in joints of left hand: Secondary | ICD-10-CM | POA: Diagnosis not present

## 2024-02-05 DIAGNOSIS — M6281 Muscle weakness (generalized): Secondary | ICD-10-CM

## 2024-02-05 NOTE — Therapy (Unsigned)
OUTPATIENT OCCUPATIONAL THERAPY ORTHO TREATMENT  Patient Name: [name] MRN: [id number] DOB:Oct 02, 1951, 73 y.o., female Today's Date: 02/05/2024  PCP: Melvenia Beam, MD REFERRING PROVIDER: Samuella Cota, MD  END OF SESSION:  OT End of Session - 02/05/24 1231     Visit Number 5    Number of Visits 20    Date for OT Re-Evaluation 03/29/23    Authorization Type [employer] 2025/Medicaid No Copay/[place] VL: [place] Auth Not Reqd    OT Start Time 1231    OT Stop Time 1315    OT Time Calculation (min) 44 min    Equipment Utilized During Treatment splint    Activity Tolerance Patient tolerated treatment well    Behavior During Therapy WFL for tasks assessed/performed             Past Medical History:  Diagnosis Date   Arthritis    knee, shouder   Asthma    MILD   Chronic cough    Constipation    Diabetes mellitus without complication (HCC)    Type II   Frequency of urination    GERD (gastroesophageal reflux disease)    Hepatitis    Hepatitis C treated    History of hepatitis DX 1973  SECONDARY TO DRUG ABUSE--  UNKNOWN TYPE PER PT--  WAS TX'D    NO ISSURES OR S & S SINCE   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  Patient not sure why she was given Phenobarital,   " before 1978"   Urgency of urination    Vaginal vault prolapse    ANTERIOR   Past Surgical History:  Procedure Laterality Date   ANTERIOR AND POSTERIOR REPAIR N/A 03/10/2013   Procedure: ANTERIOR (CYSTOCELE) AND POSTERIOR REPAIR (RECTOCELE);  Surgeon: Kathi Ludwig, MD;  Location: [facility];  Service: Urology;  Laterality: N/A;   [employer] UPHOLD LITE ANTERIOR VAULT REPAIR  POSSIBLE OP WITH OBSERVATION POSSIBLE FLOOR BED  Uphold LITE w/[employer] number [id number] qty 2 Xenform soft tissue repair  matrix 6x7 cm [id number]    BREAST REDUCTION SURGERY     BREAST SURGERY     B/L removal of benign cysts   CARPOMETACARPEL SUSPENSION PLASTY Left 12/21/2023   Procedure: LEFT THUMB CARPOMETACARPAL ARTHROPLASTY WITH INTERNAL BRACE;  Surgeon: Samuella Cota, MD;  Location: [facility];  Service: Orthopedics;  Laterality: Left;   COLONOSCOPY W/ POLYPECTOMY     CYSTOCELE REPAIR N/A 03/10/2013   Procedure:  Anterior vaginal vault prolapse repair, with [employer] uphold light sacrospinous fixation using U. device and mesh repair with [name] plication   ;  Surgeon: Kathi Ludwig, MD;  Location: [facility];  Service: Urology;  Laterality: N/A;   ESOPHAGOGASTRODUODENOSCOPY     TOTAL KNEE ARTHROPLASTY Left 04/08/2013   Procedure: TOTAL KNEE ARTHROPLASTY;  Surgeon: Cammy Copa, MD;  Location: [facility] OR;  Service: Orthopedics;  Laterality: Left;  Left total knee arthroplasty   TOTAL KNEE ARTHROPLASTY Right 05/31/2017   Procedure: RIGHT TOTAL KNEE ARTHROPLASTY;  Surgeon: Cammy Copa, MD;  Location: [facility] OR;  Service: Orthopedics;  Laterality: Right;   TUBAL LIGATION     Patient Active Problem List   Diagnosis Date Noted   Arthritis of carpometacarpal [facility]) joint of left thumb 10/18/2023  Arthritis of carpometacarpal [facility]) joint of both thumbs 07/19/2023   OA (osteoarthritis) of shoulder 11/23/2022   HCAP (healthcare-associated pneumonia) 06/03/2017   Sepsis (HCC) 06/03/2017   S/P TKR (total knee replacement), right 06/03/2017   Type 2 diabetes mellitus without complication (HCC) 06/03/2017   Arthritis of knee 05/31/2017   Essential hypertension 08/25/2013   GERD (gastroesophageal reflux disease) 05/23/2013   Narcotic withdrawal (HCC) 05/23/2013   Acute bronchitis 05/23/2013   Osteoarthritis of left knee 04/08/2013    Class: Diagnosis of   Upper airway cough syndrome 10/16/2012    ONSET DATE: 12/21/2023 (surgery)  REFERRING DIAG: M18.12  (ICD-10-CM) - Arthritis of carpometacarpal (CMC) joint of left thumb Splint fabrication/ OT in 2 weeks s/p CMC arthroplasty.     THERAPY DIAG:  Pain in joint of left hand  Stiffness of left hand, not elsewhere classified  Muscle weakness (generalized)  Other lack of coordination  Osteoarthritis of left thumb  Rationale for Evaluation and Treatment: Rehabilitation  SUBJECTIVE:   SUBJECTIVE STATEMENT: Pt returns today s/p missing visits last week as she wasn't feeling well.  She continued to report the hand as hurting but at end of session, she did note that it does ease off as she starts to move it.  As OTR has been emphasizing importance of keeping her wrist/fingers neutral due to tendency for ulnar deviation, pt reports the fingers still "go to that side" and she uses the table top to help with her exercises.   Dr. Fara Boros appt: 01/30/24  Pt accompanied by: self  PERTINENT HISTORY: DM2, HTN, GERD, arthritis CMC L thumb, OA shoulder, s/p R TKR   PRECAUTIONS: Fall  RED FLAGS: None   WEIGHT BEARING RESTRICTIONS:  LUE limitations s/p arthroplasty - splint in place except for exercises  PAIN:  Are you having pain?  Yes: NPRS scale: 8/10 at beginning of session but 5/10 after moving her hand  Pain location: thumb  Pain description: throbbing Aggravating factors: moving Relieving factors: rest and pain medicine   FALLS: Has patient fallen in last 6 months? No Patient reported that she stumbled on 02/02/24 and caught her herself with her hands including non-splinted L hand and thumb extended backwards.     LIVING ENVIRONMENT: Lives with: lives alone but grandson has been staying with her Lives in: House/apartment Stairs: No - has elevator Has following equipment at home: Single point cane, Walker - 2 wheeled, and Grab bars  PLOF: Independent - was driving before this surgery  PATIENT GOALS: Get to where I was before and use my hand better.  NEXT MD VISIT: return in 1 month  - 01/30/24  OBJECTIVE:  Note: Objective measures were completed at Evaluation unless otherwise noted.  HAND DOMINANCE: Right  ADLs: Overall ADLs: Pt reports she has been getting help from her grandchildren since her surgery Upper body dressing: Assistance to get clothing on with L UE limitations. Lower body dressing: Assistance to adjust clothing and get them over her feet. Toileting: Assistance to adjust clothing. Bathing: Assistance from family for thoroughness.  FUNCTIONAL OUTCOME MEASURES: Quick Dash: 84.1  UPPER EXTREMITY ROM:    ROM - TBA  Active ROM Right eval Left eval  Thumb MCP (0-60) WFL TBA     Thumb IP (0-80)    Thumb Radial abd/add (0-55)    Thumb Palmar abd/add (0-45)    Thumb Opposition to Small Finger     Index MCP (0-90)     Index PIP (0-100)     Index DIP (0-70)  Long MCP (0-90)     Long PIP (0-100)     Long DIP (0-70)     Ring MCP (0-90)     Ring PIP (0-100)     Ring DIP (0-70)     Little MCP (0-90)     Little PIP (0-100)     Little DIP (0-70)     (Blank rows = not tested)   UPPER EXTREMITY MMT:     BUE strength - TBA  HAND FUNCTION: TBA  COORDINATION: TBA  SENSATION: Not tested -pt reports some mild tingling and numbness of L thumb  EDEMA: L thumb  COGNITION: Overall cognitive status: Within functional limits for tasks assessed  OBSERVATIONS: Pt arrived with L arm in a sling and ambulated with no AE and no loss of balance. Pt hummed often throughout session to comfort herself.    TODAY'S TREATMENT: DATE:                                                                                                 01/29/24:   Splint: Therapist provided feedback re: scar massage at healed site of incision to reducing stiffness and dimpling of scar tissue, to promote improved AROM and reduce hypersensitivity through slow deep pressure rather than short, light touch. Pt conducted her own scar massage while OT fabricated new splint in order to  prepare for TE listed below.    Therapist fabricated hand based thumb splint or Orficast materail for daytime use with positioning of L UE supporting CMC.  Wearing instructions discussed but will be reviewed during next session and signs of skin breakdown will be monitored during future sessions to assure appropriate fit. Pt instructed to wear custom-fabricated short opponens orthosis during the day between exercises 4-6 times/day and to wear the static long wrist and thumb splint at night.  Therapeutic Exercises: Reviewed HEP with progression to more AROM of thumb as listed in pt instructions per [facility] Hand Protocol following L CMC arthroplasty. Pt is only 5+ weeks post surgery therefore added following New Exercises:  - Seated Thumb Composite Extension and Flexion   - Thumb AROM: Palmar Abduction   - Thumb AROM Circumduction Clockwise and Counterclockwise   - Thumb Opposition   Pt given visual, tactile and verbal instruction, demonstration and return demonstration sought for thumb AROM out of her splint within comfortable range and slowly increasing ROM.  She still has some ulnar deviation at the wrist with unsupported exercises and is engaged in exercises with forearm neutral on tabletop to minimize excessive stretch on thumb CMC.    PATIENT EDUCATION: Education details: updated HEP, scar massage and new splint schedule Person educated: Patient Education method: Explanation, Demonstration, Tactile cues, Verbal cues, and Handouts Education comprehension: verbalized understanding, returned demonstration, verbal cues required, tactile cues required, and needs further education  HOME EXERCISE PROGRAM: 01/02/24 - Initial HEP - Wrist AROM, thumb IP and finger opposition in splint - Access Code: [email or web address]  01/08/24 - Scar massage 01/17/24 - Progress HEP - Wrist PROM, thumb AROM with MP blocking - same access coed  GOALS: Goals reviewed with patient? Yes  SHORT TERM GOALS: Target date:  02/15/24  Pt will  be independent with splint wear and care for custom forearm spica splint at night and custom hand based spica splint in the day. Baseline: Custom forearm thumb spica splint fabricated 01/02/24 Goal status: Revised 01/16/24  2.  Pt will demo/state understanding of initial HEP to improve pain levels and prerequisite motion for functional use of her left hand/thumb. Baseline: Initiated HEP at eval Goal status: IN Progress  3. Pt will be independent with scar massage techniques for max ROM of skin around thumb surgery site.  Baseline: Unable to begin at eval  Goal Status: IN Progress   LONG TERM GOALS: Target date: 03/27/24  Pt will improve functional ability by decreased impairment per Quick DASH assessment from 84.1% to <50% or better, for better quality of life. Baseline: 84.1% Goal status: INITIAL  2.  Pt will improve grip strength in left hand from unable to assess to at least 15 lbs for functional use at home and in IADLs. Baseline: Unable to assess Goal status: INITIAL  3.  Pt will improve A/ROM in left thumb from opposition to tip of index finger on left to at least opposition to PIP (or greater) of left small finger, to have functional motion for tasks like ADL's, reach and grasp.  Baseline: Immobilized Goal status: IN Progress  4.  Pt will improve left hand functional use from unable to assess to at least be Mod I for basic ADL's (ie, brushing her hair, bathing, brushing her teeth and folding laundry) to assist in her ability to carry out selfcare and higher-level homecare tasks with less difficulty. Baseline: Assistance from family with ADLs Goal status: INITIAL  5.  Pt will improve coordination skills in left hand, as seen by better score on 9 hole peg testing to have increased functional ability to carry out fine motor tasks (fasteners, etc.) and more complex, coordinated IADLs (meal prep, sports, etc.).  Baseline: Unable to assist Goal status: INITIAL  6.   Pt will decrease pain at worst from 9/10 to <4/10 or less to have better sleep and occupational participation in daily roles. Baseline: 9/10 Goal status: INITIAL  ASSESSMENT:  CLINICAL IMPRESSION: Patient is a 73 y.o. right HD female who was seen today for occupational therapy treatment following Left thumb CMC arthroplasty on 12/21/23 - currently 5+ weeks post-op. A custom-fabricated short opponens orthosis was made today for pt to wear during the day. Pt educated in progression of HEP as per [place] Hand Protocol following L CMC arthroplasty - thumb AROM without MP blocking now.  Pt also tolerated scar massage to healed scar.  She will benefit from continued out-pt OT to address deficits in A/ROM, decreased functional use of left hand/thumb, pain, pt education, scar management/desensitization, splinting, edema control and overall functional use and strengthening of her left dominant hand.      PERFORMANCE DEFICITS: in functional skills including ADLs, IADLs, coordination, dexterity, sensation, edema, tone, ROM, strength, pain, fascial restrictions, flexibility, Fine motor control, Gross motor control, decreased knowledge of precautions, decreased knowledge of use of DME, wound, skin integrity, and UE functional use, cognitive skills including problem solving, and psychosocial skills including coping strategies, environmental adaptation, and routines and behaviors.   IMPAIRMENTS: are limiting patient from ADLs, IADLs, rest and sleep, leisure, and social participation.   COMORBIDITIES: may have co-morbidities  that affects occupational performance. Patient will benefit from skilled OT to address above impairments and improve overall function.  REHAB POTENTIAL: Good  PLAN:  OT FREQUENCY: 1-2x/week  OT DURATION: 12 weeks  PLANNED INTERVENTIONS: 97535 self care/ADL training, 40981 therapeutic exercise, 97530 therapeutic activity, 97112 neuromuscular re-education, 97140 manual therapy, 97035  ultrasound, 97018 paraffin, 19147 fluidotherapy, 97010 moist heat, 97760 Splinting (initial encounter), [id number] Subsequent splinting/medication, scar mobilization, passive range of motion, coping strategies training, patient/family education, and DME and/or AE instructions  RECOMMENDED OTHER SERVICES: NA  CONSULTED AND AGREED WITH PLAN OF CARE: Patient  PLAN FOR NEXT SESSION:   Check/modify splints and straps  Check and update HEP as protocol recommends - pt will be 6+weeks post-op due to missed appts so will need to progress through 5 Weeks Postop  PROM exercises to the MPJ and IPJ of the thumb, with the [facility] joint supported (manually or  with an orthosis).  Progress scar massage as appropriate; modalities [place] etc    Victorino Sparrow, OT 02/05/2024, 4:20 PM

## 2024-02-05 NOTE — Patient Instructions (Signed)
   Access Code: [email or web address] URL: [email or web address] Date: 02/05/2024 Prepared by: Amada Kingfisher  New Exercises - Seated Thumb IP Flexion PROM  - 2 x daily - 10 reps - Seated Thumb MP Flexion PROM  - 2 x daily - 10 reps - Seated Isometric Thumb Extension with Manual Resistance  - 3 x daily - 5-10 reps

## 2024-02-07 ENCOUNTER — Ambulatory Visit: Payer: No Typology Code available for payment source | Admitting: Occupational Therapy

## 2024-02-07 ENCOUNTER — Telehealth: Payer: Self-pay | Admitting: Occupational Therapy

## 2024-02-07 NOTE — Telephone Encounter (Signed)
Patient no-showed today's OT appointment; appointment was for 1230 and OTR reached out at 1240.  Patient reported that she was "not coming out in this mess" (snow day) and was offered a later time but reported she would still be unable to make it.  She is given her next appt - 02/12/24 at 12:30 pm and is encouraged to reach out to cancel appts as this makes patient's 4th No Show appointment without calling to cancel her visit.   Pt will be provided No-Show policy again at next appt with further appts as she relies on others for transportation.

## 2024-02-12 ENCOUNTER — Telehealth: Payer: Self-pay

## 2024-02-12 ENCOUNTER — Ambulatory Visit: Payer: No Typology Code available for payment source | Admitting: Occupational Therapy

## 2024-02-12 NOTE — Telephone Encounter (Signed)
 The patient called and left a message on the procedure scheduling voice mail [phone]), asking about having another MRI appointment, because she was told time has run out on it. Not sure who she talked to, nor what MRI to which she is referring. I called the patient back and reached her voice mail. Asked her to call me at my direct number to discuss. Awaiting response.

## 2024-02-14 ENCOUNTER — Telehealth: Payer: Self-pay | Admitting: Occupational Therapy

## 2024-02-14 ENCOUNTER — Ambulatory Visit: Payer: No Typology Code available for payment source | Admitting: Occupational Therapy

## 2024-02-14 NOTE — Telephone Encounter (Signed)
 OTR called pt this morning at 10 AM to check that pt was coming to therapy as she no-showed for her appt on 02/12/24.  Pt confirmed her appt for 12:30 PM.  At 12:40 PM, pt had not arrived and OTR called pt again to check on her.  She indicated that she had been trying to call this office but "hadn't been able to get through", it was telling her to "call back".  Pt stated she started not feeling well on her way to therapy and was trying to call to say she wasn't going to make it in.  Front office staff received no voice mail and reports no problems with phone lines today.  OTR cancelled several future appts due to non-compliance with attendance policy and was able to reschedule all other appts to 10:15 AM per her request for earlier time than 12:30 PM appts.    If patient fails to arrive for appt on 02/18/24 at 10:15 AM - remaining appts will be evaluated for cancellation per attendance policy.

## 2024-02-18 ENCOUNTER — Ambulatory Visit: Payer: Self-pay | Attending: Orthopedic Surgery | Admitting: Occupational Therapy

## 2024-02-18 DIAGNOSIS — M6281 Muscle weakness (generalized): Secondary | ICD-10-CM | POA: Insufficient documentation

## 2024-02-18 DIAGNOSIS — R278 Other lack of coordination: Secondary | ICD-10-CM | POA: Insufficient documentation

## 2024-02-18 DIAGNOSIS — M25642 Stiffness of left hand, not elsewhere classified: Secondary | ICD-10-CM | POA: Diagnosis present

## 2024-02-18 DIAGNOSIS — M25542 Pain in joints of left hand: Secondary | ICD-10-CM | POA: Diagnosis present

## 2024-02-18 NOTE — Therapy (Signed)
 OUTPATIENT OCCUPATIONAL THERAPY ORTHO TREATMENT  Patient Name: Gloria Patterson MRN: [id number] DOB:[birth year], 73 y.o., female Today's Date: 02/18/2024  PCP: Melvenia Beam, MD REFERRING PROVIDER: Samuella Cota, MD  END OF SESSION:  OT End of Session - 02/18/24 1016     Visit Number 6    Number of Visits 20    Date for OT Re-Evaluation 03/29/23    Authorization Type [employer] 2025/Medicaid No Copay/[place] VL: [place] Auth Not Reqd    OT Start Time 1015    OT Stop Time 1100    OT Time Calculation (min) 45 min    Equipment Utilized During Treatment squishy ball, FM objects    Activity Tolerance Patient tolerated treatment well    Behavior During Therapy WFL for tasks assessed/performed             Past Medical History:  Diagnosis Date   Arthritis    knee, shouder   Asthma    MILD   Chronic cough    Constipation    Diabetes mellitus without complication (HCC)    Type II   Frequency of urination    GERD (gastroesophageal reflux disease)    Hepatitis    Hepatitis C treated    History of hepatitis DX 1973  SECONDARY TO DRUG ABUSE--  UNKNOWN TYPE PER PT--  WAS TX'D    NO ISSURES OR S & S SINCE   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  Patient not sure why she was given Phenobarital,   " before 1978"   Urgency of urination    Vaginal vault prolapse    ANTERIOR   Past Surgical History:  Procedure Laterality Date   ANTERIOR AND POSTERIOR REPAIR N/A 03/10/2013   Procedure: ANTERIOR (CYSTOCELE) AND POSTERIOR REPAIR (RECTOCELE);  Surgeon: Kathi Ludwig, MD;  Location: [facility];  Service: Urology;  Laterality: N/A;   [employer] UPHOLD LITE ANTERIOR VAULT REPAIR  POSSIBLE OP WITH OBSERVATION POSSIBLE FLOOR BED  Uphold LITE w/[employer] number [id number] qty 2 Xenform soft  tissue repair matrix 6x7 cm [id number]    BREAST REDUCTION SURGERY     BREAST SURGERY     B/L removal of benign cysts   CARPOMETACARPEL SUSPENSION PLASTY Left 12/21/2023   Procedure: LEFT THUMB CARPOMETACARPAL ARTHROPLASTY WITH INTERNAL BRACE;  Surgeon: Samuella Cota, MD;  Location: [facility];  Service: Orthopedics;  Laterality: Left;   COLONOSCOPY W/ POLYPECTOMY     CYSTOCELE REPAIR N/A 03/10/2013   Procedure:  Anterior vaginal vault prolapse repair, with [employer] uphold light sacrospinous fixation using U. device and mesh repair with [name] plication   ;  Surgeon: Kathi Ludwig, MD;  Location: [facility];  Service: Urology;  Laterality: N/A;   ESOPHAGOGASTRODUODENOSCOPY     TOTAL KNEE ARTHROPLASTY Left 04/08/2013   Procedure: TOTAL KNEE ARTHROPLASTY;  Surgeon: Cammy Copa, MD;  Location: [facility] OR;  Service: Orthopedics;  Laterality: Left;  Left total knee arthroplasty   TOTAL KNEE ARTHROPLASTY Right 05/31/2017   Procedure: RIGHT TOTAL KNEE ARTHROPLASTY;  Surgeon: Cammy Copa, MD;  Location: [facility] OR;  Service: Orthopedics;  Laterality: Right;   TUBAL LIGATION     Patient Active Problem List   Diagnosis Date Noted   Arthritis of carpometacarpal (CMC) joint of  left thumb 10/18/2023   Arthritis of carpometacarpal [facility]) joint of both thumbs 07/19/2023   OA (osteoarthritis) of shoulder 11/23/2022   HCAP (healthcare-associated pneumonia) 06/03/2017   Sepsis (HCC) 06/03/2017   S/P TKR (total knee replacement), right 06/03/2017   Type 2 diabetes mellitus without complication (HCC) 06/03/2017   Arthritis of knee 05/31/2017   Essential hypertension 08/25/2013   GERD (gastroesophageal reflux disease) 05/23/2013   Narcotic withdrawal (HCC) 05/23/2013   Acute bronchitis 05/23/2013   Osteoarthritis of left knee 04/08/2013    Class: Diagnosis of   Upper airway cough syndrome 10/16/2012    ONSET DATE: 12/21/2023 (surgery)  REFERRING DIAG:  M18.12 (ICD-10-CM) - Arthritis of carpometacarpal (CMC) joint of left thumb Splint fabrication/ OT in 2 weeks s/p CMC arthroplasty.     THERAPY DIAG:  Pain in joint of left hand  Stiffness of left hand, not elsewhere classified  Muscle weakness (generalized)  Other lack of coordination  Rationale for Evaluation and Treatment: Rehabilitation  SUBJECTIVE:   SUBJECTIVE STATEMENT: Pt reports "it's getting there" when asked about her progress.  She did not have her hand based splint on upon arrival today and reported that she lost her hand splint last night but she does wear her hand/arm splint at night still.  Dr. Fara Boros appt: no follow up appt noted. MD reports from 01/30/24 stated: Continue with postoperative protocol as instructed, continue range of motion exercises for the next 2 weeks. Will progress to strengthening at the 8-week mark per protocol. Her thumb spica brace has been transition to hand-based at this point which is appropriate. Will send in some more anti-inflammatory medication for utilize around her therapy sessions per her request.   Pt accompanied by: self  PERTINENT HISTORY: DM2, HTN, GERD, arthritis CMC L thumb, OA shoulder, s/p R TKR   PRECAUTIONS: Fall  RED FLAGS: None   WEIGHT BEARING RESTRICTIONS:  LUE limitations s/p arthroplasty - splint in place except for exercises  PAIN:  Are you having pain?  No - None at beginning of session (no pain medicine in almost a week)  FALLS: Has patient fallen in last 6 months? No Patient reported that she stumbled on 02/02/24 and caught her herself with her hands including non-splinted L hand and thumb extended backwards.    LIVING ENVIRONMENT: Lives with: lives alone but grandson has been staying with her Lives in: House/apartment Stairs: No - has elevator Has following equipment at home: Single point cane, Walker - 2 wheeled, and Grab bars  PLOF: Independent - was driving before this surgery  PATIENT GOALS: Get to  where I was before and use my hand better.  NEXT MD VISIT: NA  OBJECTIVE:  Note: Objective measures were completed at Evaluation unless otherwise noted.  HAND DOMINANCE: Right  ADLs: Overall ADLs: Pt reports she has been getting help from her grandchildren since her surgery Upper body dressing: Assistance to get clothing on with L UE limitations. Lower body dressing: Assistance to adjust clothing and get them over her feet. Toileting: Assistance to adjust clothing. Bathing: Assistance from family for thoroughness.  FUNCTIONAL OUTCOME MEASURES: Quick Dash: 84.1  UPPER EXTREMITY ROM:    ROM - TBA  Active ROM Right eval Left eval  Thumb MCP (0-60) WFL TBA     Thumb IP (0-80)    Thumb Radial abd/add (0-55)    Thumb Palmar abd/add (0-45)    Thumb Opposition to Small Finger     Index MCP (0-90)     Index PIP (0-100)  Index DIP (0-70)     Long MCP (0-90)     Long PIP (0-100)     Long DIP (0-70)     Ring MCP (0-90)     Ring PIP (0-100)     Ring DIP (0-70)     Little MCP (0-90)     Little PIP (0-100)     Little DIP (0-70)     (Blank rows = not tested)   UPPER EXTREMITY MMT:     BUE strength - TBA  HAND FUNCTION: TBA  COORDINATION: TBA  SENSATION: Not tested -pt reports some mild tingling and numbness of L thumb  EDEMA: L thumb  COGNITION: Overall cognitive status: Within functional limits for tasks assessed  OBSERVATIONS: Pt arrived with L arm in a sling and ambulated with no AE and no loss of balance. Pt hummed often throughout session to comfort herself.    TODAY'S TREATMENT: DATE:                                                                                                 02/18/24:  Self care: Pt was not wearing the fabricated [employer] hand based thumb splint for daytime use with positioning of L UE supporting CMC upon arrival today but pt reported she knew where to find it. Reviewed previously provided instructions re: continued weaning out of the  short splint during the day with continued use of hand/wrist based splint at night. Weaning recommended to be a gradual process over 2-4 weeks ie) leave the splint off for light daily activities 3-4 times a day (<= 1 hour) is encouraged.    Reiterated minimizing/avoiding movements like lateral pinch, adduction of thumb against index finger and wide radial abduction per [place] protocol with images provided to help with instruction.   Therapeutic Exercises: Reviewed AROM HEP with progression include PROM/isometrics of thumb as previously listed in pt instructions per [facility] Hand Protocol following L CMC arthroplasty. Pt is now 8 weeks post surgery therefore added slight strengthening activities and reviewed - Seated Isometric Thumb Extension with Manual Resistance  due to [place] Protocol instruction to encourage the patient to continue isometrics for the APB each day15-25 reps for 6 months to a year.    Added - Putty Squeezes  - 1 x daily - 10 reps - Rolling Putty on Table  - 1 x daily - 10 reps - 3-Point Pinch with Putty  - 1 x daily - 10 reps  These tasks were completed with a small squishy ball that was softer and easier to manage than yellow putty at this time.  In addition the ball was a good option for increased friction during scar massage.  Pt instructed that the preferred method for recapturing hand and thumb strength is through normal daily activity versus a focus on using putty.  Pt given visual, tactile and verbal instruction, demonstration and return demonstration sought for exercises out of her splint within comfortable range and slowly increasing thumb ROM and strength.    Introduced Activities to work on L UE finger ROM, dexterity and isolated movements with demonstration and practice, as  well as modification, hand over hand guidance and cues throughout to improve technique, digital isolation and ease of performing task.  Tasks included:  Pick up coins, and dice with suggestion  for checkers,dominoes, buttons, and objects different sizes ... To place in containers To stack - with guidance to work on include/isolate specific fingers. To pick up items one at a time until patient got 2+ in her hand and then move item from palm to fingertips to release ie) Finger-to-palm then palm-to-finger translation of small items - Options to vary difficulty include using a washcloth under items like coins or using larger items (checkers vs coins or blocks/dominos vs dice) for increased ease of picking up items.  Timoteo Gaul to work on picking up individual dice 1 at a time until pt had 5 in palm to then roll.  Pt had to pick up dice again to try and get up to 5 of a kind images over 3 rolls.   PATIENT EDUCATION: Education details: updated HEP, continued scar massage and new splint schedule Person educated: Patient Education method: Explanation, Demonstration, Tactile cues, Verbal cues, and Handouts Education comprehension: verbalized understanding, returned demonstration, verbal cues required, tactile cues required, and needs further education  HOME EXERCISE PROGRAM: 01/02/24 - Initial HEP - Wrist AROM, thumb IP and finger opposition in splint - Access Code: [email or web address]  01/08/24 - Scar massage 01/17/24 - Progress HEP - Wrist PROM, thumb AROM with MP blocking - same access code 01/29/24 - Progress HEP - Thumb AROM (flex/ext, abduct, circumduction, opposition) - same access code 02/05/24 - Progress HEP - Thumb PROM (IP/MP flexion) and isometric extension - same access code   GOALS: Goals reviewed with patient? Yes  SHORT TERM GOALS: Target date: 02/15/24  Pt will  be independent with splint wear and care for custom forearm spica splint at night and custom hand based spica splint in the day. Baseline: Custom forearm thumb spica splint fabricated 01/02/24 Custom hand based spica splint fabricated 01/29/24 Goal status: MET  2.  Pt will demo/state understanding of initial HEP to  improve pain levels and prerequisite motion for functional use of her left hand/thumb. Baseline: Initiated HEP at eval Goal status: MET  3. Pt will be independent with scar massage techniques for max ROM of skin around thumb surgery site.  Baseline: Unable to begin at eval  Goal Status: IN Progress   LONG TERM GOALS: Target date: 03/27/24  Pt will improve functional ability by decreased impairment per Quick DASH assessment from 84.1% to <50% or better, for better quality of life. Baseline: 84.1% Goal status: IN Progress  2.  Pt will improve grip strength in left hand from unable to assess to at least 15 lbs for functional use at home and in IADLs. Baseline: Unable to assess Goal status: IN Progress  3.  Pt will improve A/ROM in left thumb from opposition to tip of index finger on left to at least opposition to PIP (or greater) of left small finger, to have functional motion for tasks like ADL's, reach and grasp.  Baseline: Immobilized Goal status: IN Progress  4.  Pt will improve left hand functional use from unable to assess to at least be Mod I for basic ADL's (ie, brushing her hair, bathing, brushing her teeth and folding laundry) to assist in her ability to carry out selfcare and higher-level homecare tasks with less difficulty. Baseline: Assistance from family with ADLs Goal status: IN Progress  5.  Pt will improve coordination skills in left hand,  as seen by better score on 9 hole peg testing to have increased functional ability to carry out fine motor tasks (fasteners, etc.) and more complex, coordinated IADLs (meal prep, sports, etc.).  Baseline: Unable to assist Goal status: IN Progress  6.  Pt will decrease pain at worst from 9/10 to <4/10 or less to have better sleep and occupational participation in daily roles. Baseline: 9/10 Goal status: IN Progress  ASSESSMENT:  CLINICAL IMPRESSION: Patient is a 73 y.o. right HD female who was seen today for occupational therapy  treatment following Left thumb CMC arthroplasty on 12/21/23 - currently 8+ weeks post-op. Pt educated in progression of HEP as per [place] Hand Protocol following L CMC arthroplasty - light strengthening exercises.  Pt encouraged to progress scar massage to healed scar.  She will benefit from continued out-pt OT to address deficits in A/ROM, decreased functional use of left hand/thumb, pain, pt education, scar management/desensitization, splinting, edema control and overall functional use and strengthening of her left dominant hand.      PERFORMANCE DEFICITS: in functional skills including ADLs, IADLs, coordination, dexterity, sensation, edema, tone, ROM, strength, pain, fascial restrictions, flexibility, Fine motor control, Gross motor control, decreased knowledge of precautions, decreased knowledge of use of DME, wound, skin integrity, and UE functional use, cognitive skills including problem solving, and psychosocial skills including coping strategies, environmental adaptation, and routines and behaviors.   IMPAIRMENTS: are limiting patient from ADLs, IADLs, rest and sleep, leisure, and social participation.   COMORBIDITIES: may have co-morbidities  that affects occupational performance. Patient will benefit from skilled OT to address above impairments and improve overall function.  REHAB POTENTIAL: Good  PLAN:  OT FREQUENCY: 1-2x/week  OT DURATION: 12 weeks  PLANNED INTERVENTIONS: 97535 self care/ADL training, 16109 therapeutic exercise, 97530 therapeutic activity, 97112 neuromuscular re-education, 97140 manual therapy, 97035 ultrasound, 97018 paraffin, 60454 fluidotherapy, 97010 moist heat, 97760 Splinting (initial encounter), [id number] Subsequent splinting/medication, scar mobilization, passive range of motion, coping strategies training, patient/family education, and DME and/or AE instructions  RECOMMENDED OTHER SERVICES: NA  CONSULTED AND AGREED WITH PLAN OF CARE: Patient  PLAN FOR NEXT  SESSION:   Check and update HEP as protocol recommends - pt will be 9+weeks post-op  Check strengthening activities initiated s/p 8 weeks Progress isometrics  Progress scar massage as appropriate due to slight Keloid and dimpling of scare; modalities [place] etc    Victorino Sparrow, OT 02/18/2024, 12:41 PM

## 2024-02-18 NOTE — Patient Instructions (Signed)
 Access Code: [email or web address] URL: [email or web address] Date: 02/18/2024 Prepared by: Amada Kingfisher  Exercises Reviewed - Seated Isometric Thumb Extension with Manual Resistance  - 3 x daily - 5-10 reps  Added - Putty Squeezes  - 1 x daily - 10 reps - Rolling Putty on Table  - 1 x daily - 10 reps - 3-Point Pinch with Putty  - 1 x daily - 10 reps

## 2024-02-19 ENCOUNTER — Encounter: Payer: No Typology Code available for payment source | Admitting: Occupational Therapy

## 2024-02-21 ENCOUNTER — Encounter: Payer: No Typology Code available for payment source | Admitting: Occupational Therapy

## 2024-02-25 ENCOUNTER — Ambulatory Visit: Payer: Self-pay | Admitting: Occupational Therapy

## 2024-02-26 ENCOUNTER — Encounter: Payer: No Typology Code available for payment source | Admitting: Occupational Therapy

## 2024-02-28 ENCOUNTER — Encounter: Payer: No Typology Code available for payment source | Admitting: Occupational Therapy

## 2024-03-03 ENCOUNTER — Encounter: Payer: Self-pay | Admitting: Occupational Therapy

## 2024-03-04 ENCOUNTER — Encounter: Payer: No Typology Code available for payment source | Admitting: Occupational Therapy

## 2024-03-06 ENCOUNTER — Encounter: Payer: No Typology Code available for payment source | Admitting: Occupational Therapy

## 2024-03-10 ENCOUNTER — Ambulatory Visit
Admission: RE | Admit: 2024-03-10 | Discharge: 2024-03-10 | Disposition: A | Discharge status: Home / Self Care | Source: Ambulatory Visit | Attending: Family Medicine

## 2024-03-10 ENCOUNTER — Ambulatory Visit
Admission: RE | Admit: 2024-03-10 | Discharge: 2024-03-10 | Disposition: A | Discharge status: Home / Self Care | Source: Ambulatory Visit | Attending: Family Medicine | Admitting: Family Medicine

## 2024-03-10 DIAGNOSIS — G8929 Other chronic pain: Secondary | ICD-10-CM

## 2024-03-10 MED ORDER — IOPAMIDOL (ISOVUE-M 200) INJECTION 41%
10.0000 mL | Freq: Once | INTRAMUSCULAR | Status: AC
  Administered 2024-03-10: 10 mL via INTRA_ARTICULAR

## 2024-03-11 ENCOUNTER — Encounter: Payer: No Typology Code available for payment source | Admitting: Occupational Therapy

## 2024-03-13 ENCOUNTER — Other Ambulatory Visit: Payer: Self-pay | Admitting: *Deleted

## 2024-03-13 ENCOUNTER — Encounter: Payer: No Typology Code available for payment source | Admitting: Occupational Therapy

## 2024-03-13 DIAGNOSIS — M75102 Unspecified rotator cuff tear or rupture of left shoulder, not specified as traumatic: Secondary | ICD-10-CM

## 2024-03-13 NOTE — Progress Notes (Signed)
 Gloria Cork, DO  Merrilyn Puma, CMA Please call this patient and tell her that the MRI of her shoulder shows a new rotator cuff tear unfortunately.  She will need to follow-up with her surgeon to discuss next steps.  If she would like to see a different surgeon then I would recommend Dr. Everardo Pacific at [facility].

## 2024-03-18 ENCOUNTER — Encounter: Payer: No Typology Code available for payment source | Admitting: Occupational Therapy

## 2024-03-20 ENCOUNTER — Encounter: Payer: No Typology Code available for payment source | Admitting: Occupational Therapy

## 2024-03-25 ENCOUNTER — Encounter: Payer: No Typology Code available for payment source | Admitting: Occupational Therapy

## 2024-03-27 ENCOUNTER — Encounter: Payer: No Typology Code available for payment source | Admitting: Occupational Therapy

## 2024-04-22 ENCOUNTER — Encounter: Payer: Self-pay | Admitting: Sports Medicine

## 2024-04-22 ENCOUNTER — Ambulatory Visit (INDEPENDENT_AMBULATORY_CARE_PROVIDER_SITE_OTHER): Admitting: Sports Medicine

## 2024-04-22 VITALS — BP 140/70 | Ht 62.0 in | Wt 162.0 lb

## 2024-04-22 DIAGNOSIS — M75102 Unspecified rotator cuff tear or rupture of left shoulder, not specified as traumatic: Secondary | ICD-10-CM

## 2024-04-22 NOTE — Progress Notes (Signed)
 Patient ID: Gloria Patterson, female   DOB: Mar 28, 1951, 73 y.o.   MRN: [id number]  Patient presents today with persistent left shoulder pain.  An MRI of this left shoulder in March showed a full-thickness supraspinatus tendon tear with less than a centimeter of retraction.  She also has severe infraspinatus tendinosis.  This is all in the setting of a previous rotator cuff repair done in 2022.  We had referred her to Dr. Yvonne Hering but she has yet to meet with him.  Physical exam was not repeated today.  We simply called Dr. Jarrell Merritts office and scheduled her an appointment while she was here.  I will defer all treatment to his discretion.  She will follow-up with me as needed.  This note was dictated using Dragon naturally speaking software and may contain errors in syntax, spelling, or content which have not been identified prior to signing this note.

## 2024-04-25 ENCOUNTER — Other Ambulatory Visit: Payer: Self-pay | Admitting: Orthopaedic Surgery

## 2024-04-25 DIAGNOSIS — M25512 Pain in left shoulder: Secondary | ICD-10-CM

## 2024-05-02 ENCOUNTER — Ambulatory Visit
Admission: RE | Admit: 2024-05-02 | Discharge: 2024-05-02 | Disposition: A | Discharge status: Home / Self Care | Source: Ambulatory Visit | Attending: Orthopaedic Surgery | Admitting: Orthopaedic Surgery

## 2024-05-02 DIAGNOSIS — M25512 Pain in left shoulder: Secondary | ICD-10-CM

## 2024-06-18 ENCOUNTER — Other Ambulatory Visit: Payer: Self-pay

## 2024-06-18 ENCOUNTER — Encounter (HOSPITAL_BASED_OUTPATIENT_CLINIC_OR_DEPARTMENT_OTHER): Payer: Self-pay | Admitting: Orthopaedic Surgery

## 2024-06-19 ENCOUNTER — Encounter (HOSPITAL_BASED_OUTPATIENT_CLINIC_OR_DEPARTMENT_OTHER)
Admission: RE | Admit: 2024-06-19 | Discharge: 2024-06-19 | Disposition: A | Discharge status: Home / Self Care | Source: Ambulatory Visit | Attending: Orthopaedic Surgery | Admitting: Orthopaedic Surgery

## 2024-06-19 DIAGNOSIS — Z01812 Encounter for preprocedural laboratory examination: Secondary | ICD-10-CM | POA: Diagnosis present

## 2024-06-19 LAB — SURGICAL PCR SCREEN
MRSA, PCR: NEGATIVE
Staphylococcus aureus: NEGATIVE

## 2024-06-25 NOTE — H&P (Signed)
 PREOPERATIVE H&P  Chief Complaint: osteoarthritis of left shoulder  HPI: Gloria Patterson is a 73 y.o. female who is scheduled for Procedure(s): ARTHROPLASTY, SHOULDER, TOTAL, REVERSE.   Gloria Patterson is a 73 year old who is referred to us  from Dr. Arvell. She has had a painful left shoulder for the past few months. She has had injections which help but only for a short period of time. She is extremely frustrated by her left shoulder. She had a left rotator cuff repair in 2022. She denies any new injury. She denies any numbness or tingling. She is right hand dominant.   Symptoms are rated as moderate to severe, and have been worsening.  This is significantly impairing activities of daily living.    Please see clinic note for further details on this patient's care.    She has elected for surgical management.   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  Patient not sure why she was given Phenobarital,    before 1978   Urgency of urination    Vaginal vault prolapse    ANTERIOR   Past Surgical History:  Procedure Laterality Date   ANTERIOR AND POSTERIOR REPAIR N/A 03/10/2013   Procedure: ANTERIOR (CYSTOCELE) AND POSTERIOR REPAIR (RECTOCELE);  Surgeon: Arlena LILLETTE Gal, MD;  Location: [facility];  Service: Urology;  Laterality: N/A;   [employer] UPHOLD LITE ANTERIOR VAULT REPAIR  POSSIBLE OP WITH OBSERVATION POSSIBLE FLOOR BED  Uphold LITE  w/[employer] number [id number] qty 2 Xenform soft tissue repair matrix 6x7 cm [id number]    BREAST REDUCTION SURGERY     BREAST SURGERY     B/L removal of benign cysts   CARPOMETACARPEL SUSPENSION PLASTY Left 12/21/2023   Procedure: LEFT THUMB CARPOMETACARPAL ARTHROPLASTY WITH INTERNAL BRACE;  Surgeon: Arlinda Buster, MD;  Location: [facility];  Service: Orthopedics;  Laterality: Left;   COLONOSCOPY W/ POLYPECTOMY     CYSTOCELE REPAIR N/A 03/10/2013   Procedure:  Anterior vaginal vault prolapse repair, with [employer] uphold light sacrospinous fixation using U. device and mesh repair with Burnard plication   ;  Surgeon: Arlena LILLETTE Gal, MD;  Location: [facility] [place];  Service: Urology;  Laterality: N/A;   ESOPHAGOGASTRODUODENOSCOPY     TOTAL KNEE ARTHROPLASTY Left 04/08/2013   Procedure: TOTAL KNEE ARTHROPLASTY;  Surgeon: Cordella Glendia Hutchinson, MD;  Location: [facility] OR;  Service: Orthopedics;  Laterality: Left;  Left total knee arthroplasty   TOTAL KNEE ARTHROPLASTY Right 05/31/2017   Procedure: RIGHT TOTAL KNEE ARTHROPLASTY;  Surgeon: Hutchinson Glendia Cordella, MD;  Location: [facility] OR;  Service: Orthopedics;  Laterality: Right;   TUBAL LIGATION     Social History   Socioeconomic History   Marital status: Single    Spouse name: Not  on file   Number of children: Not on file   Years of education: Not on file   Highest education level: Not on file  Occupational History   Not on file  Tobacco Use   Smoking status: Never   Smokeless tobacco: Never   Tobacco comments:    CANNOT HAVE NARCOTIC MEDICATIONS PER PT  Vaping Use   Vaping status: Never Used  Substance and Sexual Activity   Alcohol use: No   Drug use: No    Comment: Stopped using  Cocoine and Heroin 2011   Sexual activity: Not Currently    Birth control/protection: Post-menopausal  Other Topics Concern   Not on file  Social History Narrative   Not on file   Social Drivers of Health    Financial Resource Strain: Not on file  Food Insecurity: Not on file  Transportation Needs: Not on file  Physical Activity: Not on file  Stress: Not on file  Social Connections: Not on file   Family History  Problem Relation Age of Onset   Prostate cancer Father    Breast cancer Sister    Allergies  Allergen Reactions   Phenobarbital Other (See Comments)     ? SEIZURES ?  [ QUESTION IF SZs RESULT FROM PHENOBARB ? ]    Prior to Admission medications   Medication Sig Start Date End Date Taking? Authorizing Provider  amLODipine (NORVASC) 10 MG tablet Take 10 mg by mouth daily.   Yes [provider]  atorvastatin  (LIPITOR) 20 MG tablet Take 40 mg by mouth at bedtime.   Yes [provider]  calcium -vitamin D (OSCAL WITH D) 500-5 MG-MCG tablet Take 1 tablet by mouth daily with breakfast.   Yes [provider]  lisinopril (ZESTRIL) 40 MG tablet Take by mouth. 05/19/21  Yes [provider]  methadone  (DOLOPHINE ) 10 MG tablet Take by mouth.   Yes [provider]  Multiple Vitamin (MULTIVITAMIN WITH MINERALS) TABS tablet Take 1 tablet by mouth daily.   Yes [provider]  naproxen  (NAPROSYN ) 500 MG tablet Take 1 tablet (500 mg total) by mouth 2 (two) times daily as needed. 01/30/24  Yes Agarwala, Anshul, MD  polyethylene glycol (MIRALAX  / GLYCOLAX ) packet Take 17 g by mouth daily as needed for mild constipation.    Yes [provider]  pregabalin (LYRICA) 25 MG capsule Take 25 mg by mouth 2 (two) times daily.   Yes [provider]  albuterol  (PROVENTIL  HFA) 108 (90 BASE) MCG/ACT inhaler Inhale 2 puffs into the lungs every 4 (four) hours as needed for wheezing or shortness of breath.     [provider]  fluticasone (FLOVENT HFA) 110 MCG/ACT inhaler Inhale into the lungs. 02/05/20   [provider]  Lidocaine  (SALONPAS PAIN RELIEVING) 4 % PTCH Apply 1 patch to affected area(s) topically every 12 (twelve)  hours. Patient not taking: Reported on 01/02/2024 11/25/21   Kommor, Lum, MD  triamcinolone  cream (KENALOG ) 0.1 % Apply 1 application topically daily as needed for itching or rash. 02/21/17   [provider]    ROS: All other systems have been reviewed and were otherwise negative with the exception of those mentioned in the HPI and as above.  Physical Exam: General: Alert, no acute distress Cardiovascular: No pedal edema Respiratory: No cyanosis, no use of accessory musculature GI: No organomegaly, abdomen is soft and non-tender Skin: No lesions in the area of chief complaint Neurologic: Sensation intact distally Psychiatric: Patient is competent for consent with  normal mood and affect Lymphatic: No axillary or cervical lymphadenopathy  MUSCULOSKELETAL:  Examination of the left shoulder demonstrates a painful arc with range of motion.  She is able to actively get to about 90 degrees. Passive to 100 degrees. This is limited by patient pain. External rotation to 20 degrees.  Internal rotation to back pocket. She has weakness with supraspinatus and infraspinatus testing.  Distal motor and sensory function intact. She endorses axillary nerve sensation. She was able to appreciate deltoid motor function.  Imaging: MRI  of the left shoulder was reviewed in [place]. This demonstrates a high grade partial thickness articular surface tear and recurrent full thickness component anteriorly with some retraction. She has severe infraspinatus tendinosis. She does have areas of full thickness cartilage delamination over the humeral head and some mild cartilage loss along the inferior glenohumeral joint.   BMI: Estimated body mass index is 29.84 kg/m as calculated from the following:   Height as of this encounter: 5' 2 (1.575 m).   Weight as of this encounter: 74 kg.  Lab Results  Component Value Date   ALBUMIN  3.6 12/20/2023   Diabetes:   Patient has a diagnosis of diabetes,  Lab Results   Component Value Date   HGBA1C 6.3 (H) 06/03/2017   Smoking Status:   reports that she has never smoked. She has never used smokeless tobacco.     Assessment: osteoarthritis of left shoulder  Plan: Plan for Procedure(s): ARTHROPLASTY, SHOULDER, TOTAL, REVERSE  The risks benefits and alternatives were discussed with the patient including but not limited to the risks of nonoperative treatment, versus surgical intervention including infection, bleeding, nerve injury,  blood clots, cardiopulmonary complications, morbidity, mortality, among others, and they were willing to proceed.   We additionally specifically discussed risks of axillary nerve injury, infection, periprosthetic fracture, continued pain and longevity of implants prior to beginning procedure.    Patient will be closely monitored in PACU for medical stabilization and pain control. If found stable in PACU, patient may be discharged home with outpatient follow-up. If any concerns regarding patient's stabilization patient will be admitted for observation after surgery. The patient is planning to be discharged home with outpatient PT.   The patient acknowledged the explanation, agreed to proceed with the plan and consent was signed.   Operative Plan: Left reverse total shoulder arthroplasty Discharge Medications: standard DVT Prophylaxis: aspirin  Physical Therapy: outpatient PT Special Discharge needs: Sling (should bring with her). IceMan   Gloria LOISE Stalling, PA-C  06/25/2024 7:57 PM

## 2024-06-25 NOTE — Anesthesia Preprocedure Evaluation (Addendum)
 Anesthesia Evaluation  Patient identified by MRN, date of birth, ID band Patient awake    Reviewed: Allergy & Precautions, NPO status , Patient's Chart, lab work & pertinent test results, reviewed documented beta blocker date and time   Airway Mallampati: II  TM Distance: >3 FB Neck ROM: Full    Dental  (+) Dental Advisory Given, Edentulous Lower, Edentulous Upper   Pulmonary asthma    Pulmonary exam normal breath sounds clear to auscultation       Cardiovascular hypertension, Pt. on medications and Pt. on home beta blockers Normal cardiovascular exam Rhythm:Regular Rate:Normal     Neuro/Psych Seizures -,     GI/Hepatic ,GERD  Medicated,,(+)     substance abuse  cocaine use and IV drug use, Hepatitis -  Endo/Other  negative endocrine ROSdiabetes, Type 2    Renal/GU negative Renal ROS     Musculoskeletal negative musculoskeletal ROS (+)  narcotic dependent  Abdominal   Peds  Hematology negative hematology ROS (+)   Anesthesia Other Findings Day of surgery medications reviewed with the patient.  Reproductive/Obstetrics                              Anesthesia Physical Anesthesia Plan  ASA: 3  Anesthesia Plan: Regional and General   Post-op Pain Management: Regional block* and Ofirmev  IV (intra-op)*   Induction: Intravenous  PONV Risk Score and Plan: 2 and Dexamethasone , Ondansetron  and TIVA  Airway Management Planned: Oral ETT  Additional Equipment: None  Intra-op Plan:   Post-operative Plan:   Informed Consent: I have reviewed the patients History and Physical, chart, labs and discussed the procedure including the risks, benefits and alternatives for the proposed anesthesia with the patient or authorized representative who has indicated his/her understanding and acceptance.     Dental advisory given  Plan Discussed with: CRNA and Anesthesiologist  Anesthesia Plan  Comments:          Anesthesia Quick Evaluation

## 2024-06-26 ENCOUNTER — Ambulatory Visit (HOSPITAL_BASED_OUTPATIENT_CLINIC_OR_DEPARTMENT_OTHER): Payer: Self-pay | Admitting: Anesthesiology

## 2024-06-26 ENCOUNTER — Encounter (HOSPITAL_BASED_OUTPATIENT_CLINIC_OR_DEPARTMENT_OTHER): Payer: Self-pay | Admitting: Orthopaedic Surgery

## 2024-06-26 ENCOUNTER — Ambulatory Visit (HOSPITAL_BASED_OUTPATIENT_CLINIC_OR_DEPARTMENT_OTHER)
Admission: RE | Admit: 2024-06-26 | Discharge: 2024-06-26 | Disposition: A | Discharge status: Home / Self Care | Attending: Orthopaedic Surgery | Admitting: Orthopaedic Surgery

## 2024-06-26 ENCOUNTER — Encounter (HOSPITAL_BASED_OUTPATIENT_CLINIC_OR_DEPARTMENT_OTHER): Payer: Self-pay | Admitting: Anesthesiology

## 2024-06-26 ENCOUNTER — Other Ambulatory Visit: Payer: Self-pay

## 2024-06-26 ENCOUNTER — Encounter (HOSPITAL_BASED_OUTPATIENT_CLINIC_OR_DEPARTMENT_OTHER)
Admission: RE | Disposition: A | Discharge status: Home / Self Care | Payer: Self-pay | Source: Home / Self Care | Attending: Orthopaedic Surgery

## 2024-06-26 DIAGNOSIS — M19012 Primary osteoarthritis, left shoulder: Secondary | ICD-10-CM | POA: Diagnosis present

## 2024-06-26 DIAGNOSIS — E119 Type 2 diabetes mellitus without complications: Secondary | ICD-10-CM | POA: Diagnosis not present

## 2024-06-26 DIAGNOSIS — J45909 Unspecified asthma, uncomplicated: Secondary | ICD-10-CM | POA: Insufficient documentation

## 2024-06-26 DIAGNOSIS — Z01818 Encounter for other preprocedural examination: Secondary | ICD-10-CM

## 2024-06-26 DIAGNOSIS — I10 Essential (primary) hypertension: Secondary | ICD-10-CM | POA: Diagnosis not present

## 2024-06-26 DIAGNOSIS — Z539 Procedure and treatment not carried out, unspecified reason: Secondary | ICD-10-CM | POA: Insufficient documentation

## 2024-06-26 DIAGNOSIS — Z789 Other specified health status: Secondary | ICD-10-CM

## 2024-06-26 HISTORY — DX: Hyperlipidemia, unspecified: E78.5

## 2024-06-26 SURGERY — CANCELLED PROCEDURE
Anesthesia: Regional

## 2024-06-26 MED ORDER — LIDOCAINE 2% (20 MG/ML) 5 ML SYRINGE
INTRAMUSCULAR | Status: AC
  Filled 2024-06-26: qty 5

## 2024-06-26 MED ORDER — CELECOXIB 200 MG PO CAPS
ORAL_CAPSULE | ORAL | Status: AC
  Filled 2024-06-26: qty 1

## 2024-06-26 MED ORDER — GLYCOPYRROLATE PF 0.2 MG/ML IJ SOSY
PREFILLED_SYRINGE | INTRAMUSCULAR | Status: AC
  Filled 2024-06-26: qty 1

## 2024-06-26 MED ORDER — ACETAMINOPHEN 500 MG PO TABS
1000.0000 mg | ORAL_TABLET | Freq: Once | ORAL | Status: AC
Start: 2024-06-26 — End: 2024-06-26
  Administered 2024-06-26: 1000 mg via ORAL

## 2024-06-26 MED ORDER — VANCOMYCIN HCL 1000 MG IV SOLR
INTRAVENOUS | Status: AC
  Filled 2024-06-26: qty 20

## 2024-06-26 MED ORDER — PROPOFOL 10 MG/ML IV BOLUS
INTRAVENOUS | Status: AC
  Filled 2024-06-26: qty 20

## 2024-06-26 MED ORDER — CEFAZOLIN SODIUM-DEXTROSE 2-4 GM/100ML-% IV SOLN: 2.0000 g | INTRAVENOUS | Status: DC

## 2024-06-26 MED ORDER — EPHEDRINE 5 MG/ML INJ
INTRAVENOUS | Status: AC
  Filled 2024-06-26: qty 5

## 2024-06-26 MED ORDER — PROPOFOL 10 MG/ML IV BOLUS
INTRAVENOUS | Status: DC | PRN
  Administered 2024-06-26 (×2): 100 mg via INTRAVENOUS

## 2024-06-26 MED ORDER — GABAPENTIN 300 MG PO CAPS
300.0000 mg | ORAL_CAPSULE | Freq: Once | ORAL | Status: AC
  Administered 2024-06-26: 300 mg via ORAL

## 2024-06-26 MED ORDER — BUPIVACAINE HCL (PF) 0.25 % IJ SOLN
INTRAMUSCULAR | Status: AC
  Filled 2024-06-26: qty 30

## 2024-06-26 MED ORDER — FENTANYL CITRATE (PF) 100 MCG/2ML IJ SOLN: 25.0000 ug | INTRAMUSCULAR | Status: DC | PRN

## 2024-06-26 MED ORDER — ONDANSETRON HCL 4 MG/2ML IJ SOLN: 4.0000 mg | Freq: Once | INTRAMUSCULAR | Status: DC | PRN

## 2024-06-26 MED ORDER — FENTANYL CITRATE (PF) 100 MCG/2ML IJ SOLN
INTRAMUSCULAR | Status: AC
  Filled 2024-06-26: qty 2

## 2024-06-26 MED ORDER — ONDANSETRON HCL 4 MG/2ML IJ SOLN
INTRAMUSCULAR | Status: AC
  Filled 2024-06-26: qty 2

## 2024-06-26 MED ORDER — CEFAZOLIN SODIUM-DEXTROSE 2-4 GM/100ML-% IV SOLN
INTRAVENOUS | Status: AC
  Filled 2024-06-26: qty 100

## 2024-06-26 MED ORDER — ASPIRIN 81 MG PO CHEW: 81.0000 mg | CHEWABLE_TABLET | Freq: Two times a day (BID) | ORAL | 0 refills | Status: DC

## 2024-06-26 MED ORDER — CELECOXIB 100 MG PO CAPS: 100.0000 mg | ORAL_CAPSULE | Freq: Two times a day (BID) | ORAL | 0 refills | Status: DC

## 2024-06-26 MED ORDER — MIDAZOLAM HCL 2 MG/2ML IJ SOLN
2.0000 mg | Freq: Once | INTRAMUSCULAR | Status: AC
  Administered 2024-06-26: 2 mg via INTRAVENOUS

## 2024-06-26 MED ORDER — PHENYLEPHRINE HCL (PRESSORS) 10 MG/ML IV SOLN
INTRAVENOUS | Status: AC
  Filled 2024-06-26: qty 1

## 2024-06-26 MED ORDER — ACETAMINOPHEN 500 MG PO TABS
ORAL_TABLET | ORAL | Status: AC
  Filled 2024-06-26: qty 2

## 2024-06-26 MED ORDER — PHENYLEPHRINE 80 MCG/ML (10ML) SYRINGE FOR IV PUSH (FOR BLOOD PRESSURE SUPPORT)
PREFILLED_SYRINGE | INTRAVENOUS | Status: AC
  Filled 2024-06-26: qty 10

## 2024-06-26 MED ORDER — LACTATED RINGERS IV SOLN: INTRAVENOUS | Status: DC

## 2024-06-26 MED ORDER — MIDAZOLAM HCL 2 MG/2ML IJ SOLN
INTRAMUSCULAR | Status: AC
  Filled 2024-06-26: qty 2

## 2024-06-26 MED ORDER — OXYCODONE HCL 5 MG PO TABS: ORAL_TABLET | ORAL | 0 refills | Status: DC

## 2024-06-26 MED ORDER — TRANEXAMIC ACID-NACL 1000-0.7 MG/100ML-% IV SOLN
INTRAVENOUS | Status: AC
  Filled 2024-06-26: qty 100

## 2024-06-26 MED ORDER — MIDAZOLAM HCL 5 MG/5ML IJ SOLN
INTRAMUSCULAR | Status: DC | PRN
  Administered 2024-06-26: 2 mg via INTRAVENOUS

## 2024-06-26 MED ORDER — OXYCODONE HCL 5 MG/5ML PO SOLN: 5.0000 mg | Freq: Once | ORAL | Status: DC | PRN

## 2024-06-26 MED ORDER — OXYCODONE HCL 5 MG PO TABS: 5.0000 mg | ORAL_TABLET | Freq: Once | ORAL | Status: DC | PRN

## 2024-06-26 MED ORDER — FENTANYL CITRATE (PF) 100 MCG/2ML IJ SOLN
INTRAMUSCULAR | Status: AC
Start: 2024-06-26 — End: 2024-06-26
  Filled 2024-06-26: qty 2

## 2024-06-26 MED ORDER — LIDOCAINE HCL (CARDIAC) PF 100 MG/5ML IV SOSY
PREFILLED_SYRINGE | INTRAVENOUS | Status: DC | PRN
  Administered 2024-06-26: 50 mg via INTRAVENOUS

## 2024-06-26 MED ORDER — FENTANYL CITRATE (PF) 100 MCG/2ML IJ SOLN
50.0000 ug | Freq: Once | INTRAMUSCULAR | Status: AC
  Administered 2024-06-26: 50 ug via INTRAVENOUS

## 2024-06-26 MED ORDER — DEXAMETHASONE SODIUM PHOSPHATE 10 MG/ML IJ SOLN
INTRAMUSCULAR | Status: AC
  Filled 2024-06-26: qty 1

## 2024-06-26 MED ORDER — CELECOXIB 200 MG PO CAPS
200.0000 mg | ORAL_CAPSULE | Freq: Once | ORAL | Status: AC
  Administered 2024-06-26: 200 mg via ORAL

## 2024-06-26 MED ORDER — ROCURONIUM BROMIDE 10 MG/ML (PF) SYRINGE
PREFILLED_SYRINGE | INTRAVENOUS | Status: AC
  Filled 2024-06-26: qty 10

## 2024-06-26 MED ORDER — ACETAMINOPHEN 500 MG PO TABS: 1000.0000 mg | ORAL_TABLET | Freq: Three times a day (TID) | ORAL | 0 refills | Status: DC

## 2024-06-26 MED ORDER — ONDANSETRON HCL 4 MG PO TABS: 4.0000 mg | ORAL_TABLET | Freq: Three times a day (TID) | ORAL | 0 refills | Status: DC | PRN

## 2024-06-26 MED ORDER — GABAPENTIN 300 MG PO CAPS
ORAL_CAPSULE | ORAL | Status: AC
  Filled 2024-06-26: qty 1

## 2024-06-26 MED ORDER — TRANEXAMIC ACID-NACL 1000-0.7 MG/100ML-% IV SOLN: 1000.0000 mg | INTRAVENOUS | Status: DC

## 2024-06-26 MED ORDER — SUGAMMADEX SODIUM 200 MG/2ML IV SOLN
INTRAVENOUS | Status: AC
  Filled 2024-06-26: qty 2

## 2024-06-26 MED ORDER — MEPERIDINE HCL 25 MG/ML IJ SOLN: 6.2500 mg | INTRAMUSCULAR | Status: DC | PRN

## 2024-06-26 SURGICAL SUPPLY — 49 items
BIT DRILL 3.2 PERIPHERAL SCREW (BIT) IMPLANT
BLADE SAW SGTL 73X25 THK (BLADE) ×1 IMPLANT
BLADE SURG 10 STRL SS (BLADE) IMPLANT
BLADE SURG 15 STRL LF DISP TIS (BLADE) IMPLANT
BRUSH SCRUB EZ PLAIN DRY (MISCELLANEOUS) ×1 IMPLANT
CHLORAPREP W/TINT 26 (MISCELLANEOUS) ×1 IMPLANT
CLSR STERI-STRIP ANTIMIC 1/2X4 (GAUZE/BANDAGES/DRESSINGS) ×1 IMPLANT
COOLER ICEMAN CLASSIC (MISCELLANEOUS) ×1 IMPLANT
COVER BACK TABLE 60X90IN (DRAPES) ×1 IMPLANT
COVER MAYO STAND STRL (DRAPES) ×1 IMPLANT
DRAPE IMP U-DRAPE 54X76 (DRAPES) IMPLANT
DRAPE INCISE IOBAN 66X45 STRL (DRAPES) ×1 IMPLANT
DRAPE POUCH INSTRU U-SHP 10X18 (DRAPES) ×1 IMPLANT
DRAPE U-SHAPE 76X120 STRL (DRAPES) ×2 IMPLANT
DRSG AQUACEL AG ADV 3.5X 6 (GAUZE/BANDAGES/DRESSINGS) ×1 IMPLANT
ELECTRODE BLDE 4.0 EZ CLN MEGD (MISCELLANEOUS) ×1 IMPLANT
ELECTRODE REM PT RTRN 9FT ADLT (ELECTROSURGICAL) ×1 IMPLANT
FACESHIELD WRAPAROUND (MASK) IMPLANT
FACESHIELD WRAPAROUND OR TEAM (MASK) ×2 IMPLANT
GAUZE XEROFORM 1X8 LF (GAUZE/BANDAGES/DRESSINGS) IMPLANT
GLOVE BIO SURGEON STRL SZ 6.5 (GLOVE) ×2 IMPLANT
GLOVE BIOGEL PI IND STRL 6.5 (GLOVE) ×1 IMPLANT
GLOVE BIOGEL PI IND STRL 8 (GLOVE) ×1 IMPLANT
GLOVE ECLIPSE 8.0 STRL XLNG CF (GLOVE) ×3 IMPLANT
GOWN STRL REUS W/ TWL LRG LVL3 (GOWN DISPOSABLE) ×2 IMPLANT
GOWN STRL REUS W/TWL XL LVL3 (GOWN DISPOSABLE) ×1 IMPLANT
GUIDEWIRE GLENOID 2.5X220 (WIRE) IMPLANT
KIT CVC PI 2-LUMEN 8FR 16 PL (MISCELLANEOUS) ×1 IMPLANT
KIT STABILIZATION SHOULDER (MISCELLANEOUS) ×1 IMPLANT
MANIFOLD NEPTUNE II (INSTRUMENTS) ×1 IMPLANT
PACK BASIN DAY SURGERY FS (CUSTOM PROCEDURE TRAY) ×1 IMPLANT
PACK SHOULDER (CUSTOM PROCEDURE TRAY) ×1 IMPLANT
PAD COLD SHLDR WRAP-ON (PAD) ×1 IMPLANT
PIN GUIDE 3X75 SHOULDER (PIN) IMPLANT
RESTRAINT HEAD UNIVERSAL NS (MISCELLANEOUS) ×1 IMPLANT
SET HNDPC FAN SPRY TIP SCT (DISPOSABLE) ×1 IMPLANT
SHEET MEDIUM DRAPE 40X70 STRL (DRAPES) ×1 IMPLANT
SLEEVE SCD COMPRESS KNEE MED (STOCKING) ×1 IMPLANT
SPIKE FLUID TRANSFER (MISCELLANEOUS) IMPLANT
SPONGE T-LAP 18X18 ~~LOC~~+RFID (SPONGE) ×1 IMPLANT
SUT ETHIBOND 2 V 37 (SUTURE) ×1 IMPLANT
SUT ETHIBOND NAB CT1 #1 30IN (SUTURE) ×1 IMPLANT
SUT ETHILON 3 0 PS 1 (SUTURE) IMPLANT
SUT MNCRL AB 4-0 PS2 18 (SUTURE) ×1 IMPLANT
SUT VIC AB 0 CT1 27XBRD ANBCTR (SUTURE) IMPLANT
SUT VIC AB 3-0 SH 27X BRD (SUTURE) ×1 IMPLANT
SUTURE FIBERWR #5 38 CONV NDL (SUTURE) ×2 IMPLANT
TOWEL GREEN STERILE FF (TOWEL DISPOSABLE) ×3 IMPLANT
TUBE SUCTION HIGH CAP CLEAR NV (SUCTIONS) ×1 IMPLANT

## 2024-06-26 NOTE — Progress Notes (Signed)
Assisted Dr. Oddono with left, interscalene , ultrasound guided block. Side rails up, monitors on throughout procedure. See vital signs in flow sheet. Tolerated Procedure well. 

## 2024-06-26 NOTE — Transfer of Care (Signed)
 Immediate Anesthesia Transfer of Care Note  Patient: Gloria Patterson  Procedure(s) Performed: ARTHROPLASTY, SHOULDER, TOTAL, REVERSE (Left: Shoulder)  Patient Location: PACU  Anesthesia Type:MAC  Level of Consciousness: awake, oriented, and sedated  Airway & Oxygen Therapy: Patient Spontanous Breathing and Patient connected to face mask oxygen  Post-op Assessment: Report given to RN and Post -op Vital signs reviewed and stable  Post vital signs: Reviewed and stable  Last Vitals:  Vitals Value Taken Time  BP 136/102 06/26/24 08:15  Temp    Pulse 67 06/26/24 08:22  Resp 10 06/26/24 08:22  SpO2 89 % 06/26/24 08:22  Vitals shown include unfiled device data.  Last Pain:  Vitals:   06/26/24 0633  PainSc: 0-No pain      Patients Stated Pain Goal: 8 (06/26/24 0630)  Complications: No notable events documented.

## 2024-06-26 NOTE — Discharge Instructions (Addendum)
 May have Tylenol  at 12:30pm today, if needed.  Information for Discharge Teaching: EXPAREL  (bupivacaine  liposome injectable suspension)   Pain relief is important to your recovery. The goal is to control your pain so you can move easier and return to your normal activities as soon as possible after your procedure. Your physician may use several types of medicines to manage pain, swelling, and more.  Your surgeon or anesthesiologist gave you EXPAREL (bupivacaine ) to help control your pain after surgery.  EXPAREL  is a local anesthetic designed to release slowly over an extended period of time to provide pain relief by numbing the tissue around the surgical site. EXPAREL  is designed to release pain medication over time and can control pain for up to 72 hours. Depending on how you respond to EXPAREL , you may require less pain medication during your recovery. EXPAREL  can help reduce or eliminate the need for opioids during the first few days after surgery when pain relief is needed the most. EXPAREL  is not an opioid and is not addictive. It does not cause sleepiness or sedation.   Important! A teal colored band has been placed on your arm with the date, time and amount of EXPAREL  you have received. Please leave this armband in place for the full 96 hours following administration, and then you may remove the band. If you return to the hospital for any reason within 96 hours following the administration of EXPAREL , the armband provides important information that your health care providers to know, and alerts them that you have received this anesthetic.    Possible side effects of EXPAREL : Temporary loss of sensation or ability to move in the area where medication was injected. Nausea, vomiting, constipation Rarely, numbness and tingling in your mouth or lips, lightheadedness, or anxiety may occur. Call your doctor right away if you think you may be experiencing any of these sensations, or if you have  other questions regarding possible side effects.  Follow all other discharge instructions given to you by your surgeon or nurse. Eat a healthy diet and drink plenty of water  or other fluids. Post Anesthesia Home Care Instructions  Activity: Get plenty of rest for the remainder of the day. A responsible individual must stay with you for 24 hours following the procedure.  For the next 24 hours, DO NOT: -Drive a car -Advertising copywriter -Drink alcoholic beverages -Take any medication unless instructed by your physician -Make any legal decisions or sign important papers.  Meals: Start with liquid foods such as gelatin or soup. Progress to regular foods as tolerated. Avoid greasy, spicy, heavy foods. If nausea and/or vomiting occur, drink only clear liquids until the nausea and/or vomiting subsides. Call your physician if vomiting continues.  Special Instructions/Symptoms: Your throat may feel dry or sore from the anesthesia or the breathing tube placed in your throat during surgery. If this causes discomfort, gargle with warm salt water . The discomfort should disappear within 24 hours.  If you had a scopolamine patch placed behind your ear for the management of post- operative nausea and/or vomiting:  1. The medication in the patch is effective for 72 hours, after which it should be removed.  Wrap patch in a tissue and discard in the trash. Wash hands thoroughly with soap and water . 2. You may remove the patch earlier than 72 hours if you experience unpleasant side effects which may include dry mouth, dizziness or visual disturbances. 3. Avoid touching the patch. Wash your hands with soap and water  after contact with the patch.

## 2024-06-26 NOTE — Interval H&P Note (Signed)
 All questions answered

## 2024-06-27 ENCOUNTER — Encounter (HOSPITAL_BASED_OUTPATIENT_CLINIC_OR_DEPARTMENT_OTHER): Payer: Self-pay | Admitting: Orthopaedic Surgery

## 2024-06-27 NOTE — Brief Op Note (Signed)
 Patient cancelled prior to surgery no iv access taken to pacu

## 2024-06-30 NOTE — Op Note (Signed)
 This is a Publishing copy operative note.  Patient unfortunately had to have aborted procedure secondary to lack of IV access.  Will be rescheduled at the hospital.

## 2024-07-03 ENCOUNTER — Inpatient Hospital Stay
Admission: RE | Admit: 2024-07-03 | Discharge: 2024-07-03 | Disposition: A | Discharge status: Home / Self Care | Payer: Self-pay | Source: Ambulatory Visit | Attending: Physician Assistant | Admitting: Physician Assistant

## 2024-07-03 ENCOUNTER — Other Ambulatory Visit (HOSPITAL_COMMUNITY): Payer: Self-pay | Admitting: Physician Assistant

## 2024-07-03 DIAGNOSIS — M25512 Pain in left shoulder: Secondary | ICD-10-CM

## 2024-07-08 ENCOUNTER — Other Ambulatory Visit: Payer: Self-pay

## 2024-07-08 NOTE — Anesthesia Preprocedure Evaluation (Signed)
 Anesthesia Evaluation  Patient identified by MRN, date of birth, ID band Patient awake    Reviewed: Allergy & Precautions, NPO status , Patient's Chart, lab work & pertinent test results  Airway Mallampati: II  TM Distance: >3 FB Neck ROM: Full    Dental  (+) Dental Advisory Given, Edentulous Upper, Edentulous Lower   Pulmonary asthma    Pulmonary exam normal breath sounds clear to auscultation       Cardiovascular hypertension, Pt. on medications Normal cardiovascular exam Rhythm:Regular Rate:Normal     Neuro/Psych Seizures -, Well Controlled,   negative psych ROS   GI/Hepatic ,GERD  ,,(+)     substance abuse (methadone )  cocaine use, Hepatitis - (treated), C  Endo/Other  diabetes, Type 2    Renal/GU negative Renal ROS  negative genitourinary   Musculoskeletal  (+) Arthritis ,  narcotic dependent  Abdominal   Peds  Hematology negative hematology ROS (+)   Anesthesia Other Findings PICC line in place  Reproductive/Obstetrics                              Anesthesia Physical Anesthesia Plan  ASA: 3  Anesthesia Plan: General and Regional   Post-op Pain Management: Regional block* and Tylenol  PO (pre-op)*   Induction: Intravenous  PONV Risk Score and Plan: 3 and Dexamethasone , Ondansetron  and Treatment may vary due to age or medical condition  Airway Management Planned: Oral ETT  Additional Equipment:   Intra-op Plan:   Post-operative Plan: Extubation in OR  Informed Consent: I have reviewed the patients History and Physical, chart, labs and discussed the procedure including the risks, benefits and alternatives for the proposed anesthesia with the patient or authorized representative who has indicated his/her understanding and acceptance.     Dental advisory given  Plan Discussed with: CRNA  Anesthesia Plan Comments:          Anesthesia Quick Evaluation

## 2024-07-09 ENCOUNTER — Ambulatory Visit (HOSPITAL_COMMUNITY)
Admission: RE | Admit: 2024-07-09 | Discharge: 2024-07-09 | Disposition: A | Discharge status: Home / Self Care | Attending: Orthopaedic Surgery | Admitting: Orthopaedic Surgery

## 2024-07-09 ENCOUNTER — Ambulatory Visit (HOSPITAL_COMMUNITY): Payer: Self-pay | Admitting: Anesthesiology

## 2024-07-09 ENCOUNTER — Encounter (HOSPITAL_COMMUNITY): Payer: Self-pay | Admitting: Anesthesiology

## 2024-07-09 ENCOUNTER — Encounter (HOSPITAL_COMMUNITY): Payer: Self-pay | Admitting: Orthopaedic Surgery

## 2024-07-09 ENCOUNTER — Encounter (HOSPITAL_COMMUNITY)
Admission: RE | Disposition: A | Discharge status: Home / Self Care | Payer: Self-pay | Source: Home / Self Care | Attending: Orthopaedic Surgery

## 2024-07-09 ENCOUNTER — Ambulatory Visit (HOSPITAL_COMMUNITY)

## 2024-07-09 ENCOUNTER — Other Ambulatory Visit: Payer: Self-pay

## 2024-07-09 ENCOUNTER — Ambulatory Visit (HOSPITAL_COMMUNITY)
Admission: RE | Admit: 2024-07-09 | Discharge: 2024-07-09 | Disposition: A | Discharge status: Home / Self Care | Source: Ambulatory Visit | Attending: Physician Assistant

## 2024-07-09 DIAGNOSIS — F1120 Opioid dependence, uncomplicated: Secondary | ICD-10-CM | POA: Insufficient documentation

## 2024-07-09 DIAGNOSIS — Z01818 Encounter for other preprocedural examination: Secondary | ICD-10-CM

## 2024-07-09 DIAGNOSIS — E119 Type 2 diabetes mellitus without complications: Secondary | ICD-10-CM

## 2024-07-09 DIAGNOSIS — M25512 Pain in left shoulder: Secondary | ICD-10-CM

## 2024-07-09 DIAGNOSIS — J45909 Unspecified asthma, uncomplicated: Secondary | ICD-10-CM | POA: Insufficient documentation

## 2024-07-09 DIAGNOSIS — I10 Essential (primary) hypertension: Secondary | ICD-10-CM | POA: Diagnosis not present

## 2024-07-09 DIAGNOSIS — Z79899 Other long term (current) drug therapy: Secondary | ICD-10-CM | POA: Diagnosis not present

## 2024-07-09 DIAGNOSIS — M19012 Primary osteoarthritis, left shoulder: Secondary | ICD-10-CM | POA: Insufficient documentation

## 2024-07-09 DIAGNOSIS — R569 Unspecified convulsions: Secondary | ICD-10-CM | POA: Diagnosis not present

## 2024-07-09 DIAGNOSIS — Z87898 Personal history of other specified conditions: Secondary | ICD-10-CM

## 2024-07-09 DIAGNOSIS — K219 Gastro-esophageal reflux disease without esophagitis: Secondary | ICD-10-CM | POA: Insufficient documentation

## 2024-07-09 DIAGNOSIS — A419 Sepsis, unspecified organism: Secondary | ICD-10-CM | POA: Diagnosis not present

## 2024-07-09 DIAGNOSIS — Z8619 Personal history of other infectious and parasitic diseases: Secondary | ICD-10-CM | POA: Diagnosis not present

## 2024-07-09 HISTORY — PX: REVERSE SHOULDER ARTHROPLASTY: SHX5054

## 2024-07-09 LAB — BASIC METABOLIC PANEL WITH GFR
Anion gap: 9 (ref 5–15)
BUN: 12 mg/dL (ref 8–23)
CO2: 25 mmol/L (ref 22–32)
Calcium: 9.3 mg/dL (ref 8.9–10.3)
Chloride: 105 mmol/L (ref 98–111)
Creatinine, Ser: 0.74 mg/dL (ref 0.44–1.00)
GFR, Estimated: >60 mL/min (ref >60)
Glucose, Bld: 152 mg/dL — ABNORMAL HIGH (ref 70–99)
Potassium: 3.9 mmol/L (ref 3.5–5.1)
Sodium: 139 mmol/L (ref 135–145)

## 2024-07-09 LAB — CBC
HCT: 36.8 % (ref 36.0–46.0)
Hemoglobin: 12.1 g/dL (ref 12.0–15.0)
MCH: 30.8 pg (ref 26.0–34.0)
MCHC: 32.9 g/dL (ref 30.0–36.0)
MCV: 93.6 fL (ref 80.0–100.0)
Platelets: 246 K/uL (ref 150–400)
RBC: 3.93 MIL/uL (ref 3.87–5.11)
RDW: 13.8 % (ref 11.5–15.5)
WBC: 11.5 K/uL — ABNORMAL HIGH (ref 4.0–10.5)
nRBC: 0 % (ref 0.0–0.2)

## 2024-07-09 LAB — GLUCOSE, CAPILLARY: Glucose-Capillary: 141 mg/dL — ABNORMAL HIGH (ref 70–99)

## 2024-07-09 SURGERY — ARTHROPLASTY, SHOULDER, TOTAL, REVERSE
Anesthesia: Regional | Site: Shoulder | Laterality: Left

## 2024-07-09 MED ORDER — OXYCODONE HCL 5 MG PO TABS: ORAL_TABLET | ORAL | 0 refills | Status: AC

## 2024-07-09 MED ORDER — CELECOXIB 100 MG PO CAPS: 100.0000 mg | ORAL_CAPSULE | Freq: Two times a day (BID) | ORAL | 0 refills | Status: AC

## 2024-07-09 MED ORDER — BUPIVACAINE LIPOSOME 1.3 % IJ SUSP
INTRAMUSCULAR | Status: DC | PRN
Start: 2024-07-09 — End: 2024-07-09
  Administered 2024-07-09: 10 mL via PERINEURAL

## 2024-07-09 MED ORDER — BUPIVACAINE HCL (PF) 0.5 % IJ SOLN
INTRAMUSCULAR | Status: DC | PRN
  Administered 2024-07-09: 15 mL via PERINEURAL

## 2024-07-09 MED ORDER — ACETAMINOPHEN 500 MG PO TABS: 1000.0000 mg | ORAL_TABLET | Freq: Once | ORAL | Status: DC

## 2024-07-09 MED ORDER — CHLORHEXIDINE GLUCONATE 0.12 % MT SOLN: 15.0000 mL | Freq: Once | OROMUCOSAL | Status: AC

## 2024-07-09 MED ORDER — ACETAMINOPHEN 500 MG PO TABS: 1000.0000 mg | ORAL_TABLET | Freq: Three times a day (TID) | ORAL | 0 refills | Status: AC

## 2024-07-09 MED ORDER — PROPOFOL 500 MG/50ML IV EMUL
INTRAVENOUS | Status: DC | PRN
Start: 2024-07-09 — End: 2024-07-09
  Administered 2024-07-09: 30 mg via INTRAVENOUS
  Administered 2024-07-09: 100 mg via INTRAVENOUS

## 2024-07-09 MED ORDER — DEXAMETHASONE SODIUM PHOSPHATE 10 MG/ML IJ SOLN
INTRAMUSCULAR | Status: AC
  Filled 2024-07-09: qty 1

## 2024-07-09 MED ORDER — FENTANYL CITRATE PF 50 MCG/ML IJ SOSY
25.0000 ug | PREFILLED_SYRINGE | INTRAMUSCULAR | Status: DC | PRN
  Administered 2024-07-09 (×2): 50 ug via INTRAVENOUS

## 2024-07-09 MED ORDER — LIDOCAINE HCL 1 % IJ SOLN
INTRAMUSCULAR | Status: AC
  Filled 2024-07-09: qty 20

## 2024-07-09 MED ORDER — AMISULPRIDE (ANTIEMETIC) 5 MG/2ML IV SOLN: 10.0000 mg | Freq: Once | INTRAVENOUS | Status: DC | PRN

## 2024-07-09 MED ORDER — ONDANSETRON HCL 4 MG/2ML IJ SOLN
INTRAMUSCULAR | Status: DC | PRN
  Administered 2024-07-09: 4 mg via INTRAVENOUS

## 2024-07-09 MED ORDER — OXYCODONE HCL 5 MG PO TABS
5.0000 mg | ORAL_TABLET | Freq: Once | ORAL | Status: AC | PRN
  Administered 2024-07-09: 5 mg via ORAL

## 2024-07-09 MED ORDER — OXYCODONE HCL 5 MG/5ML PO SOLN: 5.0000 mg | Freq: Once | ORAL | Status: AC | PRN

## 2024-07-09 MED ORDER — CEFAZOLIN SODIUM-DEXTROSE 2-4 GM/100ML-% IV SOLN
2.0000 g | INTRAVENOUS | Status: AC
  Administered 2024-07-09: 2 g via INTRAVENOUS
  Filled 2024-07-09: qty 100

## 2024-07-09 MED ORDER — OXYCODONE HCL 5 MG PO TABS
ORAL_TABLET | ORAL | Status: AC
Start: 2024-07-09 — End: 2024-07-09
  Filled 2024-07-09: qty 1

## 2024-07-09 MED ORDER — LACTATED RINGERS IV SOLN
INTRAVENOUS | Status: DC
  Administered 2024-07-09: 1000 mL via INTRAVENOUS

## 2024-07-09 MED ORDER — MIDAZOLAM HCL 2 MG/2ML IJ SOLN
INTRAMUSCULAR | Status: AC
  Filled 2024-07-09: qty 2

## 2024-07-09 MED ORDER — STERILE WATER FOR IRRIGATION IR SOLN
Status: DC | PRN
  Administered 2024-07-09: 1000 mL

## 2024-07-09 MED ORDER — VANCOMYCIN HCL 1 G IV SOLR
INTRAVENOUS | Status: DC | PRN
  Administered 2024-07-09: 1000 mg via TOPICAL

## 2024-07-09 MED ORDER — PROPOFOL 10 MG/ML IV BOLUS
INTRAVENOUS | Status: AC
  Filled 2024-07-09: qty 20

## 2024-07-09 MED ORDER — PHENYLEPHRINE 80 MCG/ML (10ML) SYRINGE FOR IV PUSH (FOR BLOOD PRESSURE SUPPORT)
PREFILLED_SYRINGE | INTRAVENOUS | Status: DC | PRN
  Administered 2024-07-09: 160 ug via INTRAVENOUS

## 2024-07-09 MED ORDER — DEXMEDETOMIDINE HCL IN NACL 80 MCG/20ML IV SOLN
INTRAVENOUS | Status: DC | PRN
  Administered 2024-07-09: 10 ug via INTRAVENOUS

## 2024-07-09 MED ORDER — LIDOCAINE HCL (CARDIAC) PF 100 MG/5ML IV SOSY
PREFILLED_SYRINGE | INTRAVENOUS | Status: DC | PRN
  Administered 2024-07-09: 20 mg via INTRAVENOUS

## 2024-07-09 MED ORDER — OMEPRAZOLE 20 MG PO CPDR: 20.0000 mg | DELAYED_RELEASE_CAPSULE | Freq: Every day | ORAL | 0 refills | Status: AC

## 2024-07-09 MED ORDER — FENTANYL CITRATE PF 50 MCG/ML IJ SOSY
PREFILLED_SYRINGE | INTRAMUSCULAR | Status: AC
  Filled 2024-07-09: qty 2

## 2024-07-09 MED ORDER — DEXAMETHASONE SODIUM PHOSPHATE 10 MG/ML IJ SOLN
INTRAMUSCULAR | Status: DC | PRN
  Administered 2024-07-09: 10 mg via INTRAVENOUS

## 2024-07-09 MED ORDER — FENTANYL CITRATE PF 50 MCG/ML IJ SOSY
PREFILLED_SYRINGE | INTRAMUSCULAR | Status: AC
Start: 2024-07-09 — End: 2024-07-09
  Filled 2024-07-09: qty 2

## 2024-07-09 MED ORDER — VANCOMYCIN HCL 1000 MG IV SOLR
INTRAVENOUS | Status: AC
Start: 2024-07-09 — End: 2024-07-09
  Filled 2024-07-09: qty 20

## 2024-07-09 MED ORDER — GABAPENTIN 300 MG PO CAPS
300.0000 mg | ORAL_CAPSULE | Freq: Once | ORAL | Status: DC
  Filled 2024-07-09: qty 1

## 2024-07-09 MED ORDER — ONDANSETRON HCL 4 MG/2ML IJ SOLN
INTRAMUSCULAR | Status: AC
  Filled 2024-07-09: qty 2

## 2024-07-09 MED ORDER — PHENYLEPHRINE HCL-NACL 20-0.9 MG/250ML-% IV SOLN
INTRAVENOUS | Status: DC | PRN
  Administered 2024-07-09: 50 ug/min via INTRAVENOUS

## 2024-07-09 MED ORDER — ONDANSETRON HCL 4 MG PO TABS: 4.0000 mg | ORAL_TABLET | Freq: Three times a day (TID) | ORAL | 0 refills | Status: AC | PRN

## 2024-07-09 MED ORDER — 0.9 % SODIUM CHLORIDE (POUR BTL) OPTIME
TOPICAL | Status: DC | PRN
Start: 2024-07-09 — End: 2024-07-09
  Administered 2024-07-09: 1000 mL

## 2024-07-09 MED ORDER — ORAL CARE MOUTH RINSE
15.0000 mL | Freq: Once | OROMUCOSAL | Status: AC
  Administered 2024-07-09: 15 mL via OROMUCOSAL

## 2024-07-09 MED ORDER — TRANEXAMIC ACID-NACL 1000-0.7 MG/100ML-% IV SOLN
1000.0000 mg | INTRAVENOUS | Status: DC
  Filled 2024-07-09: qty 100

## 2024-07-09 MED ORDER — SODIUM CHLORIDE 0.9 % IR SOLN
Status: DC | PRN
Start: 2024-07-09 — End: 2024-07-09
  Administered 2024-07-09: 1000 mL

## 2024-07-09 MED ORDER — SUGAMMADEX SODIUM 200 MG/2ML IV SOLN
INTRAVENOUS | Status: DC | PRN
  Administered 2024-07-09: 200 mg via INTRAVENOUS

## 2024-07-09 MED ORDER — FENTANYL CITRATE PF 50 MCG/ML IJ SOSY: 50.0000 ug | PREFILLED_SYRINGE | Freq: Once | INTRAMUSCULAR | Status: DC

## 2024-07-09 MED ORDER — ROCURONIUM BROMIDE 10 MG/ML (PF) SYRINGE
PREFILLED_SYRINGE | INTRAVENOUS | Status: AC
  Filled 2024-07-09: qty 10

## 2024-07-09 MED ORDER — ACETAMINOPHEN 500 MG PO TABS
1000.0000 mg | ORAL_TABLET | Freq: Once | ORAL | Status: AC
  Administered 2024-07-09: 1000 mg via ORAL
  Filled 2024-07-09: qty 2

## 2024-07-09 MED ORDER — ROCURONIUM BROMIDE 100 MG/10ML IV SOLN
INTRAVENOUS | Status: DC | PRN
  Administered 2024-07-09: 50 mg via INTRAVENOUS

## 2024-07-09 MED ORDER — ASPIRIN 81 MG PO CHEW: 81.0000 mg | CHEWABLE_TABLET | Freq: Two times a day (BID) | ORAL | 0 refills | Status: AC

## 2024-07-09 SURGICAL SUPPLY — 53 items
AUGMENT BASEPLATE 15DEG 25 WDG (Joint) IMPLANT
BAG COUNTER SPONGE SURGICOUNT (BAG) ×1 IMPLANT
BIT DRILL 3.2 PERIPHERAL SCREW (BIT) IMPLANT
BLADE SAW SGTL 73X25 THK (BLADE) ×1 IMPLANT
CHLORAPREP W/TINT 26 (MISCELLANEOUS) ×2 IMPLANT
CLSR STERI-STRIP ANTIMIC 1/2X4 (GAUZE/BANDAGES/DRESSINGS) ×1 IMPLANT
COOLER ICEMAN CLASSIC (MISCELLANEOUS) ×1 IMPLANT
COVER BACK TABLE 60X90IN (DRAPES) ×1 IMPLANT
COVER SURGICAL LIGHT HANDLE (MISCELLANEOUS) ×1 IMPLANT
DRAPE C-ARM 42X120 X-RAY (DRAPES) IMPLANT
DRAPE INCISE IOBAN 66X45 STRL (DRAPES) ×1 IMPLANT
DRAPE POUCH INSTRU U-SHP 10X18 (DRAPES) ×1 IMPLANT
DRAPE SHEET LG 3/4 BI-LAMINATE (DRAPES) ×2 IMPLANT
DRAPE SURG ORHT 6 SPLT 77X108 (DRAPES) ×2 IMPLANT
DRSG AQUACEL AG ADV 3.5X 6 (GAUZE/BANDAGES/DRESSINGS) ×1 IMPLANT
ELECT BLADE TIP CTD 4 INCH (ELECTRODE) ×1 IMPLANT
ELECT PENCIL ROCKER SW 15FT (MISCELLANEOUS) ×1 IMPLANT
ELECT REM PT RETURN 15FT ADLT (MISCELLANEOUS) ×1 IMPLANT
FACESHIELD WRAPAROUND (MASK) ×3 IMPLANT
FACESHIELD WRAPAROUND OR TEAM (MASK) ×3 IMPLANT
GLENOSPHERE REV SHOULDER 36 (Joint) IMPLANT
GLOVE BIO SURGEON STRL SZ 6.5 (GLOVE) ×2 IMPLANT
GLOVE BIOGEL PI IND STRL 6.5 (GLOVE) ×1 IMPLANT
GLOVE BIOGEL PI IND STRL 8 (GLOVE) ×1 IMPLANT
GLOVE ECLIPSE 8.0 STRL XLNG CF (GLOVE) ×2 IMPLANT
GOWN STRL REUS W/ TWL LRG LVL3 (GOWN DISPOSABLE) ×2 IMPLANT
GUIDEWIRE GLENOID 2.5X220 (WIRE) IMPLANT
INSERT REVERSED HUMERAL SIZE 1 (Orthopedic Implant) IMPLANT
KIT BASIN OR (CUSTOM PROCEDURE TRAY) ×1 IMPLANT
KIT STABILIZATION SHOULDER (MISCELLANEOUS) ×1 IMPLANT
KIT TURNOVER KIT A (KITS) ×1 IMPLANT
MANIFOLD NEPTUNE II (INSTRUMENTS) ×1 IMPLANT
NS IRRIG 1000ML POUR BTL (IV SOLUTION) ×1 IMPLANT
PACK SHOULDER (CUSTOM PROCEDURE TRAY) ×1 IMPLANT
PAD COLD SHLDR WRAP-ON (PAD) ×1 IMPLANT
PIN GUIDE 3X75 SHOULDER (PIN) IMPLANT
RESTRAINT HEAD UNIVERSAL NS (MISCELLANEOUS) ×1 IMPLANT
SCREW 5.0X18 (Screw) IMPLANT
SCREW 5.5X22 (Screw) IMPLANT
SCREW BONE 6.5X40 SM (Screw) IMPLANT
SET HNDPC FAN SPRY TIP SCT (DISPOSABLE) ×1 IMPLANT
SPONGE T-LAP 4X18 ~~LOC~~+RFID (SPONGE) IMPLANT
STEM HUMERAL PLUS LONG SZ2 (Orthopedic Implant) IMPLANT
SUCTION TUBE FRAZIER 12FR DISP (SUCTIONS) IMPLANT
SUT ETHIBOND 2 V 37 (SUTURE) ×1 IMPLANT
SUT ETHIBOND NAB CT1 #1 30IN (SUTURE) ×1 IMPLANT
SUT MNCRL AB 4-0 PS2 18 (SUTURE) ×1 IMPLANT
SUT VIC AB 0 CT1 36 (SUTURE) IMPLANT
SUT VIC AB 3-0 SH 27X BRD (SUTURE) ×1 IMPLANT
SUTURE FIBERWR #5 38 CONV NDL (SUTURE) ×2 IMPLANT
TOWEL OR 17X26 10 PK STRL BLUE (TOWEL DISPOSABLE) ×1 IMPLANT
TUBE SUCTION HIGH CAP CLEAR NV (SUCTIONS) ×1 IMPLANT
WATER STERILE IRR 1000ML POUR (IV SOLUTION) ×2 IMPLANT

## 2024-07-09 NOTE — Anesthesia Procedure Notes (Signed)
 Anesthesia Regional Block: Interscalene brachial plexus block   Pre-Anesthetic Checklist: , timeout performed,  Correct Patient, Correct Site, Correct Laterality,  Correct Procedure, Correct Position, site marked,  Risks and benefits discussed,  Pre-op evaluation,  At surgeon's request and post-op pain management  Laterality: Left  Prep: Maximum Sterile Barrier Precautions used, chloraprep       Needles:  Injection technique: Single-shot  Needle Type: Echogenic Stimulator Needle     Needle Length: 5cm  Needle Gauge: 21     Additional Needles:   Procedures:,,,, ultrasound used (permanent image in chart),,    Narrative:  Start time: 07/09/2024 9:55 AM End time: 07/09/2024 10:00 AM Injection made incrementally with aspirations every 5 mL. Anesthesiologist: Niels Marien CROME, MD

## 2024-07-09 NOTE — Transfer of Care (Signed)
 Immediate Anesthesia Transfer of Care Note  Patient: Gloria Patterson  Procedure(s) Performed: ARTHROPLASTY, SHOULDER, TOTAL, REVERSE (Left: Shoulder)  Patient Location: PACU  Anesthesia Type:GA combined with regional for post-op pain  Level of Consciousness: drowsy and patient cooperative  Airway & Oxygen Therapy: Patient Spontanous Breathing  Post-op Assessment: Report given to RN and Post -op Vital signs reviewed and stable  Post vital signs: Reviewed and stable  Last Vitals:  Vitals Value Taken Time  BP 164/90 07/09/24 12:22  Temp    Pulse 85 07/09/24 12:26  Resp 12 07/09/24 12:26  SpO2 95 % 07/09/24 12:26  Vitals shown include unfiled device data.  Last Pain:  Vitals:   07/09/24 1022  TempSrc:   PainSc: 0-No pain         Complications: No notable events documented.

## 2024-07-09 NOTE — Procedures (Signed)
Interventional Radiology Procedure Note  Procedure: RUE DL POWER PICC    Complications: None  Estimated Blood Loss:  0  Findings: TIP SVCRA     M. TREVOR Tajh Livsey, MD    

## 2024-07-09 NOTE — Progress Notes (Signed)
 In PACU for PICC removal. PICC placed today in IR. Clarified with Isaiah, RN regarding order for PICC removal. States MD placed solely for procedure, and yto pull the line. PICC removed per protocol and dressed with Vaseline gauze and 2x2. No bleeding noted. Tolerated well.

## 2024-07-09 NOTE — Anesthesia Procedure Notes (Signed)
 Procedure Name: Intubation Date/Time: 07/09/2024 10:56 AM  Performed by: Nanci Riis, CRNAPre-anesthesia Checklist: Patient identified, Emergency Drugs available, Suction available, Patient being monitored and Timeout performed Patient Re-evaluated:Patient Re-evaluated prior to induction Oxygen Delivery Method: Circle system utilized Preoxygenation: Pre-oxygenation with 100% oxygen Induction Type: IV induction Ventilation: Mask ventilation without difficulty Laryngoscope Size: Miller and 3 Grade View: Grade I Tube type: Oral Number of attempts: 1 Airway Equipment and Method: Stylet Placement Confirmation: ETT inserted through vocal cords under direct vision, positive ETCO2 and breath sounds checked- equal and bilateral Secured at: 21 cm Tube secured with: Tape Dental Injury: Teeth and Oropharynx as per pre-operative assessment

## 2024-07-09 NOTE — Discharge Instructions (Signed)
 Bonner Hair MD, MPH Aleck Stalling, PA-C [facility] [address], [address] [phone] (tel)   [phone] (fax)   POST-OPERATIVE INSTRUCTIONS - TOTAL SHOULDER REPLACEMENT    WOUND CARE You may leave the operative dressing in place until your follow-up appointment. KEEP THE INCISIONS CLEAN AND DRY. There may be a small amount of fluid/bleeding leaking at the surgical site. This is normal after surgery.  If it fills with liquid or blood please call us  immediately to change it for you. Use the provided ice machine or Ice packs as often as possible for the first 3-4 days, then as needed for pain relief.   Keep a layer of cloth or a shirt between your skin and the cooling unit to prevent frost bite as it can get very cold.  SHOWERING: - You may shower on Post-Op Day #2.  - The dressing is water  resistant but do not scrub it as it may start to peel up.   - You may remove the sling for showering - Gently pat the area dry.  - Do not soak the shoulder in water .  - Do not go swimming in the pool or ocean until your incision has completely healed (about 4-6 weeks after surgery) - KEEP THE INCISIONS CLEAN AND DRY.  EXERCISES Wear the sling at all times  You may remove the sling for showering, but keep the arm across the chest or in a secondary sling.    Accidental/Purposeful External Rotation and shoulder flexion (reaching behind you) is to be avoided at all costs for the first month. It is ok to come out of your sling if your are sitting and have assistance for eating.   Do not lift anything heavier than 1 pound until we discuss it further in clinic.  It is normal for your fingers/hand to become more swollen after surgery and discolored from bruising.   This will resolve over the first few weeks usually after surgery. Please continue to ambulate and do not stay sitting or lying for too long.  Perform foot and wrist pumps to assist in circulation.  PHYSICAL  THERAPY - You will begin physical therapy soon after surgery (unless otherwise specified) - Please call to set up an appointment, if you do not already have one  - Let our office if there are any issues with scheduling your therapy   REGIONAL ANESTHESIA (NERVE BLOCKS) The anesthesia team may have performed a nerve block for you this is a great tool used to minimize pain.   The block may start wearing off overnight (between 8-24 hours postop) When the block wears off, your pain may go from nearly zero to the pain you would have had postop without the block. This is an abrupt transition but nothing dangerous is happening.   This can be a challenging period but utilize your as needed pain medications to try and manage this period. We suggest you use the pain medication the first night prior to going to bed, to ease this transition.  You may take an extra dose of narcotic when this happens if needed   POST-OP MEDICATIONS- Multimodal approach to pain control In general your pain will be controlled with a combination of substances.  Prescriptions unless otherwise discussed are electronically sent to your pharmacy.  This is a carefully made plan we use to minimize narcotic use.     Celebrex  - Anti-inflammatory medication taken on a scheduled basis Acetaminophen  - Non-narcotic pain medicine taken on a scheduled basis  Oxycodone  - This is a strong narcotic, to be used only on an "as needed" basis for SEVERE pain. THIS IS FOR BREAKTHROUGH PAIN NOT CONTROLLED BY YOUR DAILY PAIN PRESCRIPTION NO REFILLS Aspirin  81mg  - This medicine is used to minimize the risk of blood clots after surgery. Omeprazole  - daily medicine to protect your stomach while taking anti-inflammatories.   Zofran  -  take as needed for nausea   FOLLOW-UP If you develop a Fever (>101.5), Redness or Drainage from the surgical incision site, please call our office to arrange for an evaluation. Please call the office to schedule a  follow-up appointment for a wound check, 7-10 days post-operatively.  IF YOU HAVE ANY QUESTIONS, PLEASE FEEL FREE TO CALL OUR OFFICE.  HELPFUL INFORMATION  Your arm will be in a sling following surgery. You will be in this sling for the next 4 weeks.   You may be more comfortable sleeping in a semi-seated position the first few nights following surgery.  Keep a pillow propped under the elbow and forearm for comfort.  If you have a recliner type of chair it might be beneficial.  If not that is fine too, but it would be helpful to sleep propped up with pillows behind your operated shoulder as well under your elbow and forearm.  This will reduce pulling on the suture lines.  When dressing, put your operative arm in the sleeve first.  When getting undressed, take your operative arm out last.  Loose fitting, button-down shirts are recommended.  In most states it is against the law to drive while your arm is in a sling. And certainly against the law to drive while taking narcotics.  You may return to work/school in the next couple of days when you feel up to it. Desk work and typing in the sling is fine.  We suggest you use the pain medication the first night prior to going to bed, in order to ease any pain when the anesthesia wears off. You should avoid taking pain medications on an empty stomach as it will make you nauseous.  You should wean off your narcotic medicines as soon as you are able.     Most patients will be off narcotics before their first postop appointment.   Do not drink alcoholic beverages or take illicit drugs when taking pain medications.  Pain medication may make you constipated.  Below are a few solutions to try in this order: Decrease the amount of pain medication if you aren't having pain. Drink lots of decaffeinated fluids. Drink prune juice and/or each dried prunes  If the first 3 don't work start with additional solutions Take Colace - an over-the-counter stool  softener Take Senokot - an over-the-counter laxative Take Miralax  - a stronger over-the-counter laxative   Dental Antibiotics:  We require dental prophylaxis for 2 years after a shoulder replacement  Contact your surgeon for an antibiotic prescription, prior to your dental procedure.   For more information including helpful videos and documents visit our website:   [email or web address]

## 2024-07-09 NOTE — Op Note (Signed)
 Orthopaedic Surgery Operative Note (CSN: [id number])  Gloria Patterson  November 14, 1951 Date of Surgery: 07/09/2024   Diagnoses:  Left irreparable cuff tear  Procedure: Left reverse augmented total Shoulder Arthroplasty   Operative Finding Successful completion of planned procedure.  Patient had reasonable bone quality and a full-thickness retear of her superior cuff, axillary nerve tug test was normal.  Post-operative plan: The patient will be NWB in sling.  The patient will be will be discharged from PACU if continues to be stable as was plan prior to surgery.  DVT prophylaxis Aspirin  81 mg twice daily for 6 weeks.  Pain control with PRN pain medication preferring oral medicines.  Follow up plan will be scheduled in approximately 7 days for incision check and XR.  Physical therapy to start immediately.  Implants: Tornier perform humeral size 2 longstem, 0 polyethylene, 36 standard glenosphere with a 25 full wedge baseplate, 40 center screw and 4 peripheral screws  Post-Op Diagnosis: Same Surgeons:Primary: Cristy Bonner DASEN, MD Assistants:Caroline McBane, PA-C Location: [facility] ROOM 06 Anesthesia: General with Exparel  Interscalene Antibiotics: Ancef  2g preop, Vancomycin  1000mg  locally Tourniquet time: None Estimated Blood Loss: 100 Complications: None Specimens: None Implants: Implant Name Type Inv. Item Serial No. Manufacturer Lot No. LRB No. Used Action  AUGMENT BASEPLATE 15DEG 25 WDG - [id number] Joint AUGMENT BASEPLATE 15DEG 25 WDG [id number] [employer]  Left 1 Implanted  GLENOSPHERE REV SHOULDER 36 - [id number] Joint GLENOSPHERE REV SHOULDER 36 [id number] [employer]  Left 1 Implanted  INSERT REVERSED HUMERAL SIZE 1 - [id number] Orthopedic Implant INSERT REVERSED HUMERAL SIZE 1 [id number] [employer]  Left 1 Implanted  STEM HUMERAL PLUS LONG SZ2 - [id number] Orthopedic Implant STEM HUMERAL PLUS LONG SZ2 [id number] [employer]  Left 1 Implanted    Indications for Surgery:   Gloria Patterson is a 73 y.o. female with cuff tear arthropathy type findings.  Benefits and risks of operative and nonoperative management were discussed prior to surgery with patient/guardian(s) and informed consent form was completed.  Infection and need for further surgery were discussed as was prosthetic stability and cuff issues.  We additionally specifically discussed risks of axillary nerve injury, infection, periprosthetic fracture, continued pain and longevity of implants prior to beginning procedure.      Procedure:   The patient was identified in the preoperative holding area where the surgical site was marked. Block placed by anesthesia with exparel .  The patient was taken to the OR where a procedural timeout was called and the above noted anesthesia was induced.  The patient was positioned beachchair on allen table with spider arm positioner.  Preoperative antibiotics were dosed.  The patient's left shoulder was prepped and draped in the usual sterile fashion.  A second preoperative timeout was called.       Standard deltopectoral approach was performed with a #10 blade. We dissected down to the subcutaneous tissues and the cephalic vein was taken laterally with the deltoid. Clavipectoral fascia was incised in line with the incision. Deep retractors were placed. The long of the biceps tendon was identified and there was significant tenosynovitis present.  Tenodesis was performed to the pectoralis tendon with #2 Ethibond. The remaining biceps was followed up into the rotator interval where it was released.   The subscapularis was taken down in a full thickness layer with capsule along the humeral neck extending inferiorly around the humeral head. We continued releasing the capsule directly off of the osteophytes inferiorly all the way around the corner. This allowed us   to dislocate the humeral head.    The rotator cuff was carefully examined and noted to be irreperably torn.  The decision was  confirmed that a reverse total shoulder was indicated for this patient.  There were osteophytes along the inferior humeral neck. The osteophytes were removed with an osteotome and a rongeur.  Osteophytes were removed with a rongeur and an osteotome and the anatomic neck was well visualized.     A humeral cutting guide was used extra medullary with a pin to help control version. The version was set at 20 of retroversion. Humeral osteotomy was performed with an oscillating saw. The head fragment was passed off the back table.  A cut protector plate was placed.  The subscapularis was again identified and immediately we took care to palpate the axillary nerve anteriorly and verify its position with gentle palpation as well as the tug test.  We then released the SGHL with bovie cautery prior to placing a curved mayo at the junction of the anterior glenoid well above the axillary nerve and bluntly dissecting the subscapularis from the capsule.  We then carefully protected the axillary nerve as we gently released the inferior capsule to fully mobilize the subscapularis.  An anterior deltoid retractor was then placed as well as a small Hohmann retractor superiorly.   The remaining labrum was removed circumferentially taking great care not to disrupt the posterior capsule.   At this point we felt based on blueprint templating that a full wedge augment was necessary.  We began by using a full wedge guide to place our center pin as was templated.  We had good position of this pin and we proceeded with our starter center drill.  This allowed for us  to use the 15 degree full wedge reamer obtaining circumferential witness marks and good bone preparation for ingrowth.  At this point we proceeded with our center drill and had an intact vault.  We then drilled our center screw to a length of 40 mm.    We selected a 6.5 mm x 40 mm screw and the full wedge baseplate which was placed in the same orientation as our reaming.   We double checked that we had good apposition of the base plate to bone and then proceeded to place 3 locking screws and one nonlocking screw as is typical.   Next a 36 mm glenosphere was selected and impacted onto the baseplate. The center screw was tightened.  We turned attention back to the humeral side. The cut protector was removed.  We used the perform humeral sizing block to select the appropriate size which for this patient was a 2.  We then placed our center pin and reamed over it concentrically obtaining appropriate inset.  We then used our lateralizing chisel to prepare the lateral aspect of the humerus.  At that point we selected the appropriate implant trialing a 2.  Using this trial implant we trialed multiple polyethylene sizes settling on a 0 which provided good stability and range of motion without excess soft tissue tension. The offset was dialed in to match the normal anatomy. The shoulder was trialed.  There was good ROM in all planes and the shoulder was stable with no inferior translation.  The real humeral implants were opened after again confirming sizes.  The trial was removed. #5 Fiberwire x4 sutures passed through the humeral neck for subscap repair. The humeral component was press-fit obtaining a secure fit. The joint was reduced and thoroughly irrigated with pulsatile  lavage. Subscap was repaired back with #5 Fiberwire sutures through bone tunnels. Hemostasis was obtained. The deltopectoral interval was reapproximated with #1 Ethibond. The subcutaneous tissues were closed with 2-0 Vicryl and the skin was closed with running monocryl.    The wounds were cleaned and dried and an Aquacel dressing was placed. The drapes taken down. The arm was placed into sling with abduction pillow. Patient was awakened, extubated, and transferred to the recovery room in stable condition. There were no intraoperative complications. The sponge, needle, and attention counts were correct at the end of  the case.     Aleck Stalling, PA-C, present and scrubbed throughout the case, critical for completion in a timely fashion, and for retraction, instrumentation, closure.

## 2024-07-09 NOTE — H&P (Signed)
 PREOPERATIVE H&P  Chief Complaint: osteoarthritis of left shoulder  HPI: Gloria Patterson is a 73 y.o. female who is scheduled for Procedure(s): ARTHROPLASTY, SHOULDER, TOTAL, REVERSE.   Gloria Patterson is a 73 year old who is referred to us  from Dr. Arvell. Gloria Patterson has had a painful left shoulder for the past few months. Gloria Patterson has had injections which help but only for a short period of time. Gloria Patterson is extremely frustrated by her left shoulder. Gloria Patterson had a left rotator cuff repair in 2022. Gloria Patterson denies any new injury. Gloria Patterson denies any numbness or tingling. Gloria Patterson is right hand dominant.   Her surgery on 7/10 unfortunately had to be aborted secondary to lack of IV access.  Symptoms are rated as moderate to severe, and have been worsening.  This is significantly impairing activities of daily living.    Please see clinic note for further details on this patient's care.    Gloria Patterson has elected for surgical management.   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  Patient not sure why Gloria Patterson was given Phenobarital,    before 1978   Urgency of urination    Vaginal vault prolapse    ANTERIOR   Past Surgical History:  Procedure Laterality Date   ANTERIOR AND POSTERIOR REPAIR N/A 03/10/2013   Procedure: ANTERIOR (CYSTOCELE) AND POSTERIOR REPAIR (RECTOCELE);  Surgeon: Arlena LILLETTE Gal, MD;  Location: [facility];  Service: Urology;  Laterality: N/A;   [employer] UPHOLD  LITE ANTERIOR VAULT REPAIR  POSSIBLE OP WITH OBSERVATION POSSIBLE FLOOR BED  Uphold LITE w/[employer] number [id number] qty 2 Xenform soft tissue repair matrix 6x7 cm [id number]    BREAST REDUCTION SURGERY     BREAST SURGERY     B/L removal of benign cysts   CARPOMETACARPEL SUSPENSION PLASTY Left 12/21/2023   Procedure: LEFT THUMB CARPOMETACARPAL ARTHROPLASTY WITH INTERNAL BRACE;  Surgeon: Arlinda Buster, MD;  Location: [facility];  Service: Orthopedics;  Laterality: Left;   COLONOSCOPY W/ POLYPECTOMY     CYSTOCELE REPAIR N/A 03/10/2013   Procedure:  Anterior vaginal vault prolapse repair, with [employer] uphold light sacrospinous fixation using U. device and mesh repair with Burnard plication   ;  Surgeon: Arlena LILLETTE Gal, MD;  Location: [facility];  Service: Urology;  Laterality: N/A;   ESOPHAGOGASTRODUODENOSCOPY     TOTAL KNEE ARTHROPLASTY Left 04/08/2013   Procedure: TOTAL KNEE ARTHROPLASTY;  Surgeon: Cordella Glendia Hutchinson, MD;  Location: [facility] OR;  Service: Orthopedics;  Laterality: Left;  Left total knee arthroplasty   TOTAL KNEE ARTHROPLASTY Right 05/31/2017   Procedure: RIGHT TOTAL KNEE ARTHROPLASTY;  Surgeon: Hutchinson Glendia Cordella, MD;  Location: [facility] OR;  Service: Orthopedics;  Laterality: Right;   TUBAL LIGATION     Social  History   Socioeconomic History   Marital status: Single    Spouse name: Not on file   Number of children: Not on file   Years of education: Not on file   Highest education level: Not on file  Occupational History   Not on file  Tobacco Use   Smoking status: Never   Smokeless tobacco: Never   Tobacco comments:    CANNOT HAVE NARCOTIC MEDICATIONS PER PT  Vaping Use   Vaping status: Never Used  Substance and Sexual Activity   Alcohol use: No   Drug use: No    Comment: Stopped using  Cocoine and Heroin 2011   Sexual activity: Not Currently    Birth control/protection: Post-menopausal  Other Topics Concern    Not on file  Social History Narrative   Not on file   Social Drivers of Health   Financial Resource Strain: Not on file  Food Insecurity: Not on file  Transportation Needs: Not on file  Physical Activity: Not on file  Stress: Not on file  Social Connections: Not on file   Family History  Problem Relation Age of Onset   Prostate cancer Father    Breast cancer Sister    Allergies  Allergen Reactions   Phenobarbital Other (See Comments)     ? SEIZURES ?  [ QUESTION IF SZs RESULT FROM PHENOBARB ? ]    Prior to Admission medications   Medication Sig Start Date End Date Taking? Authorizing Provider  amLODipine (NORVASC) 10 MG tablet Take 10 mg by mouth daily.   Yes [provider]  atorvastatin  (LIPITOR) 20 MG tablet Take 40 mg by mouth at bedtime.   Yes [provider]  calcium -vitamin D (OSCAL WITH D) 500-5 MG-MCG tablet Take 1 tablet by mouth daily with breakfast.   Yes [provider]  lisinopril (ZESTRIL) 40 MG tablet Take by mouth. 05/19/21  Yes [provider]  methadone  (DOLOPHINE ) 10 MG tablet Take by mouth.   Yes [provider]  Multiple Vitamin (MULTIVITAMIN WITH MINERALS) TABS tablet Take 1 tablet by mouth daily.   Yes [provider]  naproxen  (NAPROSYN ) 500 MG tablet Take 1 tablet (500 mg total) by mouth 2 (two) times daily as needed. 01/30/24  Yes Agarwala, Anshul, MD  polyethylene glycol (MIRALAX  / GLYCOLAX ) packet Take 17 g by mouth daily as needed for mild constipation.    Yes [provider]  pregabalin (LYRICA) 25 MG capsule Take 25 mg by mouth 2 (two) times daily.   Yes [provider]  albuterol  (PROVENTIL  HFA) 108 (90 BASE) MCG/ACT inhaler Inhale 2 puffs into the lungs every 4 (four) hours as needed for wheezing or shortness of breath.     [provider]  fluticasone (FLOVENT HFA) 110 MCG/ACT inhaler Inhale into the lungs. 02/05/20   [provider]  Lidocaine  (SALONPAS PAIN  RELIEVING) 4 % PTCH Apply 1 patch to affected area(s) topically every 12 (twelve) hours. Patient not taking: Reported on 01/02/2024 11/25/21   Kommor, Lum, MD  triamcinolone  cream (KENALOG ) 0.1 % Apply 1 application topically daily as needed for itching or rash. 02/21/17   [provider]    ROS: All other systems have been reviewed and were otherwise negative with the exception of those mentioned in the HPI and as above.  Physical Exam: General: Alert, no acute distress Cardiovascular: No pedal edema Respiratory: No cyanosis, no use of accessory musculature GI: No organomegaly, abdomen is soft and non-tender Skin: No lesions in  the area of chief complaint Neurologic: Sensation intact distally Psychiatric: Patient is competent for consent with normal mood and affect Lymphatic: No axillary or cervical lymphadenopathy  MUSCULOSKELETAL:  Examination of the left shoulder demonstrates a painful arc with range of motion.  Gloria Patterson is able to actively get to about 90 degrees. Passive to 100 degrees. This is limited by patient pain. External rotation to 20 degrees.  Internal rotation to back pocket. Gloria Patterson has weakness with supraspinatus and infraspinatus testing.  Distal motor and sensory function intact. Gloria Patterson endorses axillary nerve sensation. Gloria Patterson was able to appreciate deltoid motor function.  Imaging: MRI  of the left shoulder was reviewed in [place]. This demonstrates a high grade partial thickness articular surface tear and recurrent full thickness component anteriorly with some retraction. Gloria Patterson has severe infraspinatus tendinosis. Gloria Patterson does have areas of full thickness cartilage delamination over the humeral head and some mild cartilage loss along the inferior glenohumeral joint.   BMI: Estimated body mass index is 28.79 kg/m as calculated from the following:   Height as of 06/26/24: 5' 2 (1.575 m).   Weight as of 06/26/24: 71.4 kg.  Lab Results  Component Value Date   ALBUMIN  3.6  12/20/2023   Diabetes:   Patient has a diagnosis of diabetes,  Lab Results  Component Value Date   HGBA1C 6.3 (H) 06/03/2017   Smoking Status:   reports that Gloria Patterson has never smoked. Gloria Patterson has never used smokeless tobacco.     Assessment: osteoarthritis of left shoulder  Plan: Plan for Procedure(s): ARTHROPLASTY, SHOULDER, TOTAL, REVERSE  The risks benefits and alternatives were discussed with the patient including but not limited to the risks of nonoperative treatment, versus surgical intervention including infection, bleeding, nerve injury,  blood clots, cardiopulmonary complications, morbidity, mortality, among others, and they were willing to proceed.   We additionally specifically discussed risks of axillary nerve injury, infection, periprosthetic fracture, continued pain and longevity of implants prior to beginning procedure.    Patient will be closely monitored in PACU for medical stabilization and pain control. If found stable in PACU, patient may be discharged home with outpatient follow-up. If any concerns regarding patient's stabilization patient will be admitted for observation after surgery. The patient is planning to be discharged home with outpatient PT.   The patient acknowledged the explanation, agreed to proceed with the plan and consent was signed.   Operative Plan: Left reverse total shoulder arthroplasty Discharge Medications: standard DVT Prophylaxis: aspirin  Physical Therapy: outpatient PT Special Discharge needs: Sling (should bring with her). IceMan   Aleck LOISE Stalling, PA-C  07/09/2024 6:11 AM

## 2024-07-11 NOTE — Anesthesia Postprocedure Evaluation (Signed)
 Anesthesia Post Note  Patient: Gloria Patterson  Procedure(s) Performed: ARTHROPLASTY, SHOULDER, TOTAL, REVERSE (Left: Shoulder)     Patient location during evaluation: PACU Anesthesia Type: Regional and General Level of consciousness: awake and alert Pain management: pain level controlled Vital Signs Assessment: post-procedure vital signs reviewed and stable Respiratory status: spontaneous breathing, nonlabored ventilation, respiratory function stable and patient connected to nasal cannula oxygen Cardiovascular status: blood pressure returned to baseline and stable Postop Assessment: no apparent nausea or vomiting Anesthetic complications: no   No notable events documented.  Last Vitals:  Vitals:   07/09/24 1445 07/09/24 1515  BP: (!) 157/69 (!) 155/77  Pulse: 79   Resp:    Temp:    SpO2: 95%     Last Pain:  Vitals:   07/09/24 1345  TempSrc:   PainSc: 3    Pain Goal: Patients Stated Pain Goal: 3 (07/09/24 1345)                 Cleston Lautner L Shemica Meath

## 2024-07-15 ENCOUNTER — Encounter (HOSPITAL_COMMUNITY): Payer: Self-pay | Admitting: Orthopaedic Surgery

## 2024-07-18 ENCOUNTER — Encounter (HOSPITAL_BASED_OUTPATIENT_CLINIC_OR_DEPARTMENT_OTHER): Payer: Self-pay | Admitting: Orthopaedic Surgery

## 2024-07-29 ENCOUNTER — Ambulatory Visit: Admitting: Orthopedic Surgery

## 2024-10-19 ENCOUNTER — Other Ambulatory Visit: Payer: Self-pay

## 2024-10-19 ENCOUNTER — Emergency Department (HOSPITAL_BASED_OUTPATIENT_CLINIC_OR_DEPARTMENT_OTHER)
Admission: EM | Admit: 2024-10-19 | Discharge: 2024-10-19 | Disposition: A | Discharge status: Home / Self Care | Attending: Emergency Medicine | Admitting: Emergency Medicine

## 2024-10-19 ENCOUNTER — Encounter (HOSPITAL_BASED_OUTPATIENT_CLINIC_OR_DEPARTMENT_OTHER): Payer: Self-pay

## 2024-10-19 ENCOUNTER — Emergency Department (HOSPITAL_BASED_OUTPATIENT_CLINIC_OR_DEPARTMENT_OTHER)

## 2024-10-19 DIAGNOSIS — R519 Headache, unspecified: Secondary | ICD-10-CM | POA: Diagnosis present

## 2024-10-19 DIAGNOSIS — E119 Type 2 diabetes mellitus without complications: Secondary | ICD-10-CM | POA: Diagnosis not present

## 2024-10-19 DIAGNOSIS — Z87442 Personal history of urinary calculi: Secondary | ICD-10-CM | POA: Diagnosis not present

## 2024-10-19 DIAGNOSIS — I10 Essential (primary) hypertension: Secondary | ICD-10-CM | POA: Diagnosis not present

## 2024-10-19 DIAGNOSIS — R42 Dizziness and giddiness: Secondary | ICD-10-CM | POA: Diagnosis not present

## 2024-10-19 DIAGNOSIS — Z79899 Other long term (current) drug therapy: Secondary | ICD-10-CM | POA: Diagnosis not present

## 2024-10-19 LAB — CBC
HCT: 36.2 % (ref 36.0–46.0)
Hemoglobin: 12.2 g/dL (ref 12.0–15.0)
MCH: 31.1 pg (ref 26.0–34.0)
MCHC: 33.7 g/dL (ref 30.0–36.0)
MCV: 92.3 fL (ref 80.0–100.0)
Platelets: 235 K/uL (ref 150–400)
RBC: 3.92 MIL/uL (ref 3.87–5.11)
RDW: 13.9 % (ref 11.5–15.5)
WBC: 9 K/uL (ref 4.0–10.5)
nRBC: 0 % (ref 0.0–0.2)

## 2024-10-19 LAB — BASIC METABOLIC PANEL WITH GFR
Anion gap: 11 (ref 5–15)
BUN: 16 mg/dL (ref 8–23)
CO2: 26 mmol/L (ref 22–32)
Calcium: 9.9 mg/dL (ref 8.9–10.3)
Chloride: 106 mmol/L (ref 98–111)
Creatinine, Ser: 0.88 mg/dL (ref 0.44–1.00)
GFR, Estimated: >60 mL/min (ref >60)
Glucose, Bld: 102 mg/dL — ABNORMAL HIGH (ref 70–99)
Potassium: 4.4 mmol/L (ref 3.5–5.1)
Sodium: 143 mmol/L (ref 135–145)

## 2024-10-19 MED ORDER — DIPHENHYDRAMINE HCL 50 MG/ML IJ SOLN
12.5000 mg | Freq: Once | INTRAMUSCULAR | Status: AC
  Administered 2024-10-19: 12.5 mg via INTRAVENOUS
  Filled 2024-10-19: qty 1

## 2024-10-19 MED ORDER — PROCHLORPERAZINE EDISYLATE 10 MG/2ML IJ SOLN
5.0000 mg | Freq: Once | INTRAMUSCULAR | Status: AC
  Administered 2024-10-19: 5 mg via INTRAVENOUS
  Filled 2024-10-19: qty 2

## 2024-10-19 MED ORDER — SODIUM CHLORIDE 0.9 % IV BOLUS
1000.0000 mL | Freq: Once | INTRAVENOUS | Status: AC
  Administered 2024-10-19: 1000 mL via INTRAVENOUS

## 2024-10-19 NOTE — ED Notes (Signed)

## 2024-10-19 NOTE — Discharge Instructions (Signed)
 Continue Tylenol  as needed for headaches.  Follow-up with your primary care doctor.  Return if symptoms worsen.  Lab work and your head CT were normal today.

## 2024-10-19 NOTE — ED Notes (Signed)
 Attempted IV access to RAC, unsuccessful. Tol well. Unable to attempt [facility] IV access on left due to recent shoulder surgery

## 2024-10-19 NOTE — ED Triage Notes (Signed)
 Reports headache and dizziness for 1 week. Denies head injury, N/V.   A&Ox4, ambulatory

## 2024-10-19 NOTE — ED Provider Notes (Signed)
 [place] [facility] AT [facility] Provider Note   CSN: [id number] Arrival date & time: 10/19/24  1400     Patient presents with: Headache   Gloria Patterson is a 73 y.o. female.   Patient here with on and off headache for the last week or so.  Sometimes dizzy.  Pain mostly to the posterior neck up around to the sides of the head.  No nausea or vomiting.  Has not had any weakness numbness tingling.  No vision loss.  No speech changes.  History of diabetes hypertension high cholesterol kidney stones seizures.  This.  She denies any fever chills neck stiffness.  She has been able to ambulate without any issues.  She does not feel any stroke symptoms.  Right now she is just having headache but no dizziness.  She is ambulatory back to the room without any issues.  The history is provided by the patient.       Prior to Admission medications   Medication Sig Start Date End Date Taking? Authorizing Provider  albuterol  (PROVENTIL  HFA) 108 (90 BASE) MCG/ACT inhaler Inhale 2 puffs into the lungs every 4 (four) hours as needed for wheezing or shortness of breath.     [provider]  amLODipine (NORVASC) 10 MG tablet Take 10 mg by mouth daily.    [provider]  atorvastatin  (LIPITOR) 20 MG tablet Take 40 mg by mouth at bedtime.    [provider]  calcium -vitamin D (OSCAL WITH D) 500-5 MG-MCG tablet Take 1 tablet by mouth daily with breakfast.    [provider]  fluticasone (FLOVENT HFA) 110 MCG/ACT inhaler Inhale into the lungs. 02/05/20   [provider]  Lidocaine  (SALONPAS PAIN RELIEVING) 4 % PTCH Apply 1 patch to affected area(s) topically every 12 (twelve) hours. 11/25/21   Kommor, Madison, MD  lisinopril (ZESTRIL) 40 MG tablet Take by mouth. 05/19/21   [provider]  methadone  (DOLOPHINE ) 10 MG tablet Take by mouth.    [provider]  Multiple Vitamin (MULTIVITAMIN WITH MINERALS) TABS tablet Take 1  tablet by mouth daily.    [provider]  polyethylene glycol (MIRALAX  / GLYCOLAX ) packet Take 17 g by mouth daily as needed for mild constipation.     [provider]  pregabalin (LYRICA) 25 MG capsule Take 25 mg by mouth 2 (two) times daily.    [provider]  triamcinolone  cream (KENALOG ) 0.1 % Apply 1 application topically daily as needed for itching or rash. 02/21/17   [provider]    Allergies: Phenobarbital    Review of Systems  Updated Vital Signs BP 135/62 (BP Location: Right Arm)   Pulse 70   Temp 98.1 F (36.7 C) (Oral)   Resp 12   SpO2 96%   Physical Exam Vitals and nursing note reviewed.  Constitutional:      General: She is not in acute distress.    Appearance: She is well-developed. She is not ill-appearing.  HENT:     Head: Normocephalic and atraumatic.     Mouth/Throat:     Mouth: Mucous membranes are moist.  Eyes:     Extraocular Movements: Extraocular movements intact.     Right eye: Normal extraocular motion and no nystagmus.     Left eye: Normal extraocular motion and no nystagmus.     Conjunctiva/sclera: Conjunctivae normal.     Pupils: Pupils are equal, round, and reactive to light.     Right eye: Pupil is  reactive.     Left eye: Pupil is reactive.  Neck:     Meningeal: Brudzinski's sign and Kernig's sign absent.     Comments: Tenderness to the paraspinal cervical muscles Cardiovascular:     Rate and Rhythm: Normal rate and regular rhythm.     Heart sounds: No murmur heard. Pulmonary:     Effort: Pulmonary effort is normal. No respiratory distress.     Breath sounds: Normal breath sounds.  Abdominal:     Palpations: Abdomen is soft.     Tenderness: There is no abdominal tenderness.  Musculoskeletal:        General: No swelling.     Cervical back: Normal range of motion and neck supple. No rigidity.  Lymphadenopathy:     Cervical: No cervical adenopathy.  Skin:    General: Skin is warm and dry.      Capillary Refill: Capillary refill takes less than 2 seconds.  Neurological:     Mental Status: She is alert and oriented to person, place, and time.     Comments: 5+ out of 5 strength, normal sensation normal speech normal cranial nerves normal gait normal visual fields normal coordination  Psychiatric:        Mood and Affect: Mood normal.     (all labs ordered are listed, but only abnormal results are displayed) Labs Reviewed  BASIC METABOLIC PANEL WITH GFR - Abnormal; Notable for the following components:      Result Value   Glucose, Bld 102 (*)    All other components within normal limits  CBC    EKG: EKG Interpretation Date/Time:  Sunday October 19 2024 14:46:36 EST Ventricular Rate:  59 PR Interval:  171 QRS Duration:  101 QT Interval:  434 QTC Calculation: 430 R Axis:   6  Text Interpretation: Sinus rhythm Confirmed by Ruthe Cornet [phone] on 10/19/2024 3:10:32 PM  Radiology: CT Head Wo Contrast Result Date: 10/19/2024 CLINICAL DATA:  Headache, increasing frequency or severity EXAM: CT HEAD WITHOUT CONTRAST TECHNIQUE: Contiguous axial images were obtained from the base of the skull through the vertex without intravenous contrast. RADIATION DOSE REDUCTION: This exam was performed according to the departmental dose-optimization program which includes automated exposure control, adjustment of the mA and/or kV according to patient size and/or use of iterative reconstruction technique. COMPARISON:  11/25/2021 FINDINGS: Brain: No acute infarct or hemorrhage. Lateral ventricles and midline structures are unremarkable. No acute extra-axial fluid collections. No mass effect. Vascular: No hyperdense vessel or unexpected calcification. Skull: Normal. Negative for fracture or focal lesion. Sinuses/Orbits: No acute finding. Other: None. IMPRESSION: 1. No acute intracranial process. Electronically Signed   By: Ozell Daring M.D.   On: 10/19/2024 16:59     Procedures   Medications  Ordered in the ED  prochlorperazine (COMPAZINE) injection 5 mg (5 mg Intravenous Given 10/19/24 1619)  diphenhydrAMINE  (BENADRYL ) injection 12.5 mg (12.5 mg Intravenous Given 10/19/24 1619)  sodium chloride  0.9 % bolus 1,000 mL (1,000 mLs Intravenous New Bag/Given 10/19/24 1620)                                    Medical Decision Making Amount and/or Complexity of Data Reviewed Labs: ordered. Radiology: ordered.  Risk Prescription drug management.   Gloria Patterson is here with headache and dizziness.  History of substance abuse hepatitis seizures hypertension kidney stones diabetes.  Overall normal vitals.  No fever.  Normal neurological exam.  Ambulatory in the room without any issues.  She has been having headache on and off for the last week mostly posterior cervical area up to the sides of her head's.  She has felt dizzy at times but has not had any falls.  She is not having dizziness now.  Not having any chest pain shortness of breath weakness numbness tingling.  Overall I do suspect likely some sort of muscle tension process or maybe a migraine type process.  Have no concern for stroke but will get a CT of the head  to further evaluate for new headache here in the last week.  Will check basic labs give headache cocktail with Compazine and Benadryl .  Will give fluid bolus.  Have no concern for infectious process.  Noes concern for other acute process.  Overall lab work showed no significant leukocytosis anemia or electrolyte abnormality.  CT head was unremarkable.  Her headaches completely resolved.  I have no concern for stroke given that she is able to ambulate well she has no nystagmus no coordination issues.  Seems like she is mostly here with a bad migraine that resolved with Compazine and Benadryl .  Continue Tylenol  and supportive care at home.  She understands return precautions.  We talked about stroke and MRI and symptoms to come back for.  Patient discharged.  This chart was  dictated using voice recognition software.  Despite best efforts to proofread,  errors can occur which can change the documentation meaning.      Final diagnoses:  Bad headache    ED Discharge Orders     None          Ruthe Cornet, DO 10/19/24 1708

## 2024-10-19 NOTE — ED Notes (Signed)
 ED Provider at bedside for U/S access

## 2024-10-20 ENCOUNTER — Encounter: Payer: Self-pay | Admitting: Radiology
